# Patient Record
Sex: Female | Born: 1950
Health system: Southern US, Community
[De-identification: ages and names within clinical notes are randomized; demographics above are authoritative.]

## PROBLEM LIST (undated history)

## (undated) DIAGNOSIS — I639 Cerebral infarction, unspecified: Secondary | ICD-10-CM

## (undated) DIAGNOSIS — M5136 Other intervertebral disc degeneration, lumbar region: Secondary | ICD-10-CM

## (undated) DIAGNOSIS — M533 Sacrococcygeal disorders, not elsewhere classified: Secondary | ICD-10-CM

## (undated) DIAGNOSIS — M51369 Other intervertebral disc degeneration, lumbar region without mention of lumbar back pain or lower extremity pain: Secondary | ICD-10-CM

## (undated) DIAGNOSIS — J302 Other seasonal allergic rhinitis: Secondary | ICD-10-CM

## (undated) DIAGNOSIS — M5416 Radiculopathy, lumbar region: Secondary | ICD-10-CM

## (undated) DIAGNOSIS — M5431 Sciatica, right side: Secondary | ICD-10-CM

## (undated) DIAGNOSIS — M1990 Unspecified osteoarthritis, unspecified site: Secondary | ICD-10-CM

## (undated) DIAGNOSIS — G459 Transient cerebral ischemic attack, unspecified: Secondary | ICD-10-CM

## (undated) DIAGNOSIS — R202 Paresthesia of skin: Secondary | ICD-10-CM

## (undated) DIAGNOSIS — M810 Age-related osteoporosis without current pathological fracture: Secondary | ICD-10-CM

## (undated) DIAGNOSIS — G43909 Migraine, unspecified, not intractable, without status migrainosus: Secondary | ICD-10-CM

## (undated) DIAGNOSIS — M419 Scoliosis, unspecified: Secondary | ICD-10-CM

## (undated) DIAGNOSIS — E7800 Pure hypercholesterolemia, unspecified: Secondary | ICD-10-CM

## (undated) DIAGNOSIS — R739 Hyperglycemia, unspecified: Secondary | ICD-10-CM

## (undated) DIAGNOSIS — R519 Headache, unspecified: Secondary | ICD-10-CM

## (undated) DIAGNOSIS — F411 Generalized anxiety disorder: Secondary | ICD-10-CM

## (undated) DIAGNOSIS — E785 Hyperlipidemia, unspecified: Secondary | ICD-10-CM

## (undated) HISTORY — DX: Pure hypercholesterolemia, unspecified: E78.00

## (undated) HISTORY — DX: Cerebral infarction, unspecified: I63.9

## (undated) HISTORY — DX: Hyperlipidemia, unspecified: E78.5

## (undated) HISTORY — DX: Paresthesia of skin: R20.2

## (undated) HISTORY — DX: Sacrococcygeal disorders, not elsewhere classified: M53.3

## (undated) HISTORY — DX: Scoliosis, unspecified: M41.9

## (undated) HISTORY — DX: Generalized anxiety disorder: F41.1

## (undated) HISTORY — DX: Other intervertebral disc degeneration, lumbar region: M51.36

## (undated) HISTORY — DX: Sciatica, right side: M54.31

## (undated) HISTORY — DX: Other intervertebral disc degeneration, lumbar region without mention of lumbar back pain or lower extremity pain: M51.369

## (undated) HISTORY — DX: Radiculopathy, lumbar region: M54.16

## (undated) HISTORY — DX: Transient cerebral ischemic attack, unspecified: G45.9

## (undated) HISTORY — DX: Headache, unspecified: R51.9

## (undated) HISTORY — PX: HAND SURGERY: SHX662

## (undated) HISTORY — DX: Other seasonal allergic rhinitis: J30.2

## (undated) HISTORY — DX: Migraine, unspecified, not intractable, without status migrainosus: G43.909

## (undated) HISTORY — DX: Hyperglycemia, unspecified: R73.9

## (undated) HISTORY — DX: Age-related osteoporosis without current pathological fracture: M81.0

## (undated) HISTORY — DX: Unspecified osteoarthritis, unspecified site: M19.90

---

## 1975-08-13 HISTORY — PX: OVARIAN CYST REMOVAL: SHX89

## 2002-01-27 LAB — HM COLONOSCOPY

## 2011-05-01 LAB — HM COLONOSCOPY

## 2015-05-19 ENCOUNTER — Other Ambulatory Visit: Payer: Self-pay | Admitting: Obstetrics and Gynecology

## 2015-05-19 DIAGNOSIS — R928 Other abnormal and inconclusive findings on diagnostic imaging of breast: Secondary | ICD-10-CM

## 2015-06-05 ENCOUNTER — Ambulatory Visit
Admission: RE | Admit: 2015-06-05 | Discharge: 2015-06-05 | Disposition: A | Discharge status: Home / Self Care | Payer: BLUE CROSS/BLUE SHIELD | Source: Ambulatory Visit | Attending: Obstetrics and Gynecology | Admitting: Obstetrics and Gynecology

## 2015-06-05 DIAGNOSIS — R928 Other abnormal and inconclusive findings on diagnostic imaging of breast: Secondary | ICD-10-CM

## 2016-06-15 DIAGNOSIS — N3001 Acute cystitis with hematuria: Secondary | ICD-10-CM | POA: Diagnosis not present

## 2016-06-25 DIAGNOSIS — R0789 Other chest pain: Secondary | ICD-10-CM | POA: Diagnosis not present

## 2016-06-25 DIAGNOSIS — F411 Generalized anxiety disorder: Secondary | ICD-10-CM | POA: Diagnosis not present

## 2016-06-25 DIAGNOSIS — R531 Weakness: Secondary | ICD-10-CM | POA: Diagnosis not present

## 2016-06-25 DIAGNOSIS — R739 Hyperglycemia, unspecified: Secondary | ICD-10-CM | POA: Diagnosis not present

## 2016-06-25 DIAGNOSIS — E7800 Pure hypercholesterolemia, unspecified: Secondary | ICD-10-CM | POA: Diagnosis not present

## 2016-06-25 DIAGNOSIS — Z0000 Encounter for general adult medical examination without abnormal findings: Secondary | ICD-10-CM | POA: Diagnosis not present

## 2016-06-25 DIAGNOSIS — M19041 Primary osteoarthritis, right hand: Secondary | ICD-10-CM | POA: Diagnosis not present

## 2016-07-17 DIAGNOSIS — E784 Other hyperlipidemia: Secondary | ICD-10-CM | POA: Diagnosis not present

## 2016-07-17 DIAGNOSIS — Z8249 Family history of ischemic heart disease and other diseases of the circulatory system: Secondary | ICD-10-CM | POA: Diagnosis not present

## 2016-07-17 DIAGNOSIS — R29898 Other symptoms and signs involving the musculoskeletal system: Secondary | ICD-10-CM | POA: Diagnosis not present

## 2016-08-23 DIAGNOSIS — H16223 Keratoconjunctivitis sicca, not specified as Sjogren's, bilateral: Secondary | ICD-10-CM | POA: Diagnosis not present

## 2016-08-23 DIAGNOSIS — H04123 Dry eye syndrome of bilateral lacrimal glands: Secondary | ICD-10-CM | POA: Diagnosis not present

## 2016-08-23 DIAGNOSIS — H25813 Combined forms of age-related cataract, bilateral: Secondary | ICD-10-CM | POA: Diagnosis not present

## 2016-09-05 DIAGNOSIS — Z85828 Personal history of other malignant neoplasm of skin: Secondary | ICD-10-CM | POA: Diagnosis not present

## 2016-09-05 DIAGNOSIS — D2271 Melanocytic nevi of right lower limb, including hip: Secondary | ICD-10-CM | POA: Diagnosis not present

## 2016-09-05 DIAGNOSIS — Z23 Encounter for immunization: Secondary | ICD-10-CM | POA: Diagnosis not present

## 2016-09-05 DIAGNOSIS — L089 Local infection of the skin and subcutaneous tissue, unspecified: Secondary | ICD-10-CM | POA: Diagnosis not present

## 2016-09-05 DIAGNOSIS — L821 Other seborrheic keratosis: Secondary | ICD-10-CM | POA: Diagnosis not present

## 2016-10-23 DIAGNOSIS — M79641 Pain in right hand: Secondary | ICD-10-CM | POA: Diagnosis not present

## 2016-10-23 DIAGNOSIS — M65342 Trigger finger, left ring finger: Secondary | ICD-10-CM | POA: Diagnosis not present

## 2016-10-23 DIAGNOSIS — M65312 Trigger thumb, left thumb: Secondary | ICD-10-CM | POA: Diagnosis not present

## 2016-10-23 DIAGNOSIS — M79642 Pain in left hand: Secondary | ICD-10-CM | POA: Diagnosis not present

## 2016-10-31 DIAGNOSIS — B349 Viral infection, unspecified: Secondary | ICD-10-CM | POA: Diagnosis not present

## 2016-10-31 DIAGNOSIS — J029 Acute pharyngitis, unspecified: Secondary | ICD-10-CM | POA: Diagnosis not present

## 2016-11-02 DIAGNOSIS — B301 Conjunctivitis due to adenovirus: Secondary | ICD-10-CM | POA: Diagnosis not present

## 2016-11-11 DIAGNOSIS — B301 Conjunctivitis due to adenovirus: Secondary | ICD-10-CM | POA: Diagnosis not present

## 2016-11-11 DIAGNOSIS — H16223 Keratoconjunctivitis sicca, not specified as Sjogren's, bilateral: Secondary | ICD-10-CM | POA: Diagnosis not present

## 2016-11-11 DIAGNOSIS — H25813 Combined forms of age-related cataract, bilateral: Secondary | ICD-10-CM | POA: Diagnosis not present

## 2016-11-11 DIAGNOSIS — H04123 Dry eye syndrome of bilateral lacrimal glands: Secondary | ICD-10-CM | POA: Diagnosis not present

## 2016-11-19 DIAGNOSIS — R0981 Nasal congestion: Secondary | ICD-10-CM | POA: Diagnosis not present

## 2016-11-19 DIAGNOSIS — M19041 Primary osteoarthritis, right hand: Secondary | ICD-10-CM | POA: Diagnosis not present

## 2016-11-19 DIAGNOSIS — R05 Cough: Secondary | ICD-10-CM | POA: Diagnosis not present

## 2016-11-19 DIAGNOSIS — M19042 Primary osteoarthritis, left hand: Secondary | ICD-10-CM | POA: Diagnosis not present

## 2016-11-19 DIAGNOSIS — B349 Viral infection, unspecified: Secondary | ICD-10-CM | POA: Diagnosis not present

## 2016-12-03 DIAGNOSIS — J343 Hypertrophy of nasal turbinates: Secondary | ICD-10-CM | POA: Diagnosis not present

## 2016-12-03 DIAGNOSIS — J3089 Other allergic rhinitis: Secondary | ICD-10-CM | POA: Insufficient documentation

## 2016-12-03 DIAGNOSIS — J302 Other seasonal allergic rhinitis: Secondary | ICD-10-CM | POA: Diagnosis not present

## 2016-12-16 DIAGNOSIS — M180 Bilateral primary osteoarthritis of first carpometacarpal joints: Secondary | ICD-10-CM | POA: Diagnosis not present

## 2016-12-16 DIAGNOSIS — M19041 Primary osteoarthritis, right hand: Secondary | ICD-10-CM | POA: Diagnosis not present

## 2016-12-16 DIAGNOSIS — M65312 Trigger thumb, left thumb: Secondary | ICD-10-CM | POA: Diagnosis not present

## 2016-12-16 DIAGNOSIS — M19042 Primary osteoarthritis, left hand: Secondary | ICD-10-CM | POA: Diagnosis not present

## 2016-12-16 DIAGNOSIS — M65341 Trigger finger, right ring finger: Secondary | ICD-10-CM | POA: Diagnosis not present

## 2016-12-16 DIAGNOSIS — M65332 Trigger finger, left middle finger: Secondary | ICD-10-CM | POA: Diagnosis not present

## 2016-12-16 DIAGNOSIS — M65342 Trigger finger, left ring finger: Secondary | ICD-10-CM | POA: Diagnosis not present

## 2016-12-24 DIAGNOSIS — E7800 Pure hypercholesterolemia, unspecified: Secondary | ICD-10-CM | POA: Diagnosis not present

## 2016-12-24 DIAGNOSIS — M19041 Primary osteoarthritis, right hand: Secondary | ICD-10-CM | POA: Diagnosis not present

## 2016-12-24 DIAGNOSIS — R739 Hyperglycemia, unspecified: Secondary | ICD-10-CM | POA: Diagnosis not present

## 2016-12-24 DIAGNOSIS — M19042 Primary osteoarthritis, left hand: Secondary | ICD-10-CM | POA: Diagnosis not present

## 2016-12-24 DIAGNOSIS — M816 Localized osteoporosis [Lequesne]: Secondary | ICD-10-CM | POA: Diagnosis not present

## 2017-01-13 DIAGNOSIS — M810 Age-related osteoporosis without current pathological fracture: Secondary | ICD-10-CM | POA: Diagnosis not present

## 2017-02-04 DIAGNOSIS — I8312 Varicose veins of left lower extremity with inflammation: Secondary | ICD-10-CM | POA: Diagnosis not present

## 2017-02-04 DIAGNOSIS — I8311 Varicose veins of right lower extremity with inflammation: Secondary | ICD-10-CM | POA: Diagnosis not present

## 2017-02-11 DIAGNOSIS — Z779 Other contact with and (suspected) exposures hazardous to health: Secondary | ICD-10-CM | POA: Diagnosis not present

## 2017-02-11 DIAGNOSIS — Z124 Encounter for screening for malignant neoplasm of cervix: Secondary | ICD-10-CM | POA: Diagnosis not present

## 2017-02-11 DIAGNOSIS — Z681 Body mass index (BMI) 19 or less, adult: Secondary | ICD-10-CM | POA: Diagnosis not present

## 2017-02-14 DIAGNOSIS — I8312 Varicose veins of left lower extremity with inflammation: Secondary | ICD-10-CM | POA: Diagnosis not present

## 2017-02-14 DIAGNOSIS — I8311 Varicose veins of right lower extremity with inflammation: Secondary | ICD-10-CM | POA: Diagnosis not present

## 2017-03-12 DIAGNOSIS — I8311 Varicose veins of right lower extremity with inflammation: Secondary | ICD-10-CM | POA: Diagnosis not present

## 2017-03-12 DIAGNOSIS — I8312 Varicose veins of left lower extremity with inflammation: Secondary | ICD-10-CM | POA: Diagnosis not present

## 2017-04-16 DIAGNOSIS — Z23 Encounter for immunization: Secondary | ICD-10-CM | POA: Diagnosis not present

## 2017-04-21 DIAGNOSIS — N390 Urinary tract infection, site not specified: Secondary | ICD-10-CM | POA: Diagnosis not present

## 2017-04-21 DIAGNOSIS — N760 Acute vaginitis: Secondary | ICD-10-CM | POA: Diagnosis not present

## 2017-04-28 ENCOUNTER — Other Ambulatory Visit: Payer: Self-pay | Admitting: Family Medicine

## 2017-04-28 DIAGNOSIS — M419 Scoliosis, unspecified: Secondary | ICD-10-CM

## 2017-04-28 DIAGNOSIS — N3090 Cystitis, unspecified without hematuria: Secondary | ICD-10-CM | POA: Diagnosis not present

## 2017-04-28 DIAGNOSIS — M5431 Sciatica, right side: Secondary | ICD-10-CM | POA: Diagnosis not present

## 2017-04-28 DIAGNOSIS — R739 Hyperglycemia, unspecified: Secondary | ICD-10-CM | POA: Diagnosis not present

## 2017-04-28 DIAGNOSIS — R202 Paresthesia of skin: Secondary | ICD-10-CM | POA: Diagnosis not present

## 2017-04-28 DIAGNOSIS — M5416 Radiculopathy, lumbar region: Secondary | ICD-10-CM | POA: Diagnosis not present

## 2017-04-28 DIAGNOSIS — E7800 Pure hypercholesterolemia, unspecified: Secondary | ICD-10-CM | POA: Diagnosis not present

## 2017-04-28 DIAGNOSIS — M19041 Primary osteoarthritis, right hand: Secondary | ICD-10-CM | POA: Diagnosis not present

## 2017-04-28 DIAGNOSIS — M19042 Primary osteoarthritis, left hand: Secondary | ICD-10-CM | POA: Diagnosis not present

## 2017-05-05 DIAGNOSIS — N390 Urinary tract infection, site not specified: Secondary | ICD-10-CM | POA: Diagnosis not present

## 2017-05-05 DIAGNOSIS — Z1589 Genetic susceptibility to other disease: Secondary | ICD-10-CM | POA: Diagnosis not present

## 2017-05-05 DIAGNOSIS — L509 Urticaria, unspecified: Secondary | ICD-10-CM | POA: Diagnosis not present

## 2017-05-08 ENCOUNTER — Other Ambulatory Visit: Payer: Self-pay | Admitting: Family Medicine

## 2017-05-08 ENCOUNTER — Ambulatory Visit
Admission: RE | Admit: 2017-05-08 | Discharge: 2017-05-08 | Disposition: A | Discharge status: Home / Self Care | Payer: Medicare Other | Source: Ambulatory Visit | Attending: Family Medicine | Admitting: Family Medicine

## 2017-05-08 DIAGNOSIS — M48061 Spinal stenosis, lumbar region without neurogenic claudication: Secondary | ICD-10-CM | POA: Diagnosis not present

## 2017-05-08 DIAGNOSIS — M419 Scoliosis, unspecified: Secondary | ICD-10-CM

## 2017-05-27 DIAGNOSIS — M5416 Radiculopathy, lumbar region: Secondary | ICD-10-CM | POA: Diagnosis not present

## 2017-05-27 DIAGNOSIS — M5136 Other intervertebral disc degeneration, lumbar region: Secondary | ICD-10-CM | POA: Diagnosis not present

## 2017-05-29 DIAGNOSIS — N3000 Acute cystitis without hematuria: Secondary | ICD-10-CM | POA: Diagnosis not present

## 2017-06-24 DIAGNOSIS — M150 Primary generalized (osteo)arthritis: Secondary | ICD-10-CM | POA: Diagnosis not present

## 2017-06-24 DIAGNOSIS — Z681 Body mass index (BMI) 19 or less, adult: Secondary | ICD-10-CM | POA: Diagnosis not present

## 2017-06-24 DIAGNOSIS — M5136 Other intervertebral disc degeneration, lumbar region: Secondary | ICD-10-CM | POA: Diagnosis not present

## 2017-06-24 DIAGNOSIS — M5030 Other cervical disc degeneration, unspecified cervical region: Secondary | ICD-10-CM | POA: Diagnosis not present

## 2017-06-24 DIAGNOSIS — M810 Age-related osteoporosis without current pathological fracture: Secondary | ICD-10-CM | POA: Diagnosis not present

## 2017-06-24 DIAGNOSIS — Z1589 Genetic susceptibility to other disease: Secondary | ICD-10-CM | POA: Diagnosis not present

## 2017-06-27 DIAGNOSIS — Z1231 Encounter for screening mammogram for malignant neoplasm of breast: Secondary | ICD-10-CM | POA: Diagnosis not present

## 2017-07-01 DIAGNOSIS — I8311 Varicose veins of right lower extremity with inflammation: Secondary | ICD-10-CM | POA: Diagnosis not present

## 2017-07-17 DIAGNOSIS — M5136 Other intervertebral disc degeneration, lumbar region: Secondary | ICD-10-CM | POA: Diagnosis not present

## 2017-07-17 DIAGNOSIS — M5416 Radiculopathy, lumbar region: Secondary | ICD-10-CM | POA: Diagnosis not present

## 2017-07-23 DIAGNOSIS — I8312 Varicose veins of left lower extremity with inflammation: Secondary | ICD-10-CM | POA: Diagnosis not present

## 2017-07-31 DIAGNOSIS — M5136 Other intervertebral disc degeneration, lumbar region: Secondary | ICD-10-CM | POA: Diagnosis not present

## 2017-07-31 DIAGNOSIS — M5416 Radiculopathy, lumbar region: Secondary | ICD-10-CM | POA: Diagnosis not present

## 2017-08-22 DIAGNOSIS — M7981 Nontraumatic hematoma of soft tissue: Secondary | ICD-10-CM | POA: Diagnosis not present

## 2017-08-22 DIAGNOSIS — I8311 Varicose veins of right lower extremity with inflammation: Secondary | ICD-10-CM | POA: Diagnosis not present

## 2017-08-26 DIAGNOSIS — H16223 Keratoconjunctivitis sicca, not specified as Sjogren's, bilateral: Secondary | ICD-10-CM | POA: Diagnosis not present

## 2017-08-26 DIAGNOSIS — H04123 Dry eye syndrome of bilateral lacrimal glands: Secondary | ICD-10-CM | POA: Diagnosis not present

## 2017-08-26 DIAGNOSIS — H2513 Age-related nuclear cataract, bilateral: Secondary | ICD-10-CM | POA: Diagnosis not present

## 2017-08-29 DIAGNOSIS — M5416 Radiculopathy, lumbar region: Secondary | ICD-10-CM | POA: Diagnosis not present

## 2017-08-29 DIAGNOSIS — M19042 Primary osteoarthritis, left hand: Secondary | ICD-10-CM | POA: Diagnosis not present

## 2017-08-29 DIAGNOSIS — Z87440 Personal history of urinary (tract) infections: Secondary | ICD-10-CM | POA: Diagnosis not present

## 2017-08-29 DIAGNOSIS — R739 Hyperglycemia, unspecified: Secondary | ICD-10-CM | POA: Diagnosis not present

## 2017-08-29 DIAGNOSIS — M19041 Primary osteoarthritis, right hand: Secondary | ICD-10-CM | POA: Diagnosis not present

## 2017-08-29 DIAGNOSIS — E7800 Pure hypercholesterolemia, unspecified: Secondary | ICD-10-CM | POA: Diagnosis not present

## 2017-08-30 DIAGNOSIS — M533 Sacrococcygeal disorders, not elsewhere classified: Secondary | ICD-10-CM | POA: Diagnosis not present

## 2017-09-02 DIAGNOSIS — I8312 Varicose veins of left lower extremity with inflammation: Secondary | ICD-10-CM | POA: Diagnosis not present

## 2017-09-02 DIAGNOSIS — R3129 Other microscopic hematuria: Secondary | ICD-10-CM | POA: Diagnosis not present

## 2017-09-22 DIAGNOSIS — M533 Sacrococcygeal disorders, not elsewhere classified: Secondary | ICD-10-CM | POA: Diagnosis not present

## 2017-09-22 DIAGNOSIS — M5416 Radiculopathy, lumbar region: Secondary | ICD-10-CM | POA: Diagnosis not present

## 2017-09-27 DIAGNOSIS — M6281 Muscle weakness (generalized): Secondary | ICD-10-CM | POA: Diagnosis not present

## 2017-09-27 DIAGNOSIS — M5416 Radiculopathy, lumbar region: Secondary | ICD-10-CM | POA: Diagnosis not present

## 2017-10-07 DIAGNOSIS — N3021 Other chronic cystitis with hematuria: Secondary | ICD-10-CM | POA: Diagnosis not present

## 2017-10-07 DIAGNOSIS — R3121 Asymptomatic microscopic hematuria: Secondary | ICD-10-CM | POA: Diagnosis not present

## 2017-10-07 DIAGNOSIS — R3982 Chronic bladder pain: Secondary | ICD-10-CM | POA: Diagnosis not present

## 2017-10-14 DIAGNOSIS — M5416 Radiculopathy, lumbar region: Secondary | ICD-10-CM | POA: Diagnosis not present

## 2017-11-18 ENCOUNTER — Emergency Department (HOSPITAL_COMMUNITY): Admission: EM | Admit: 2017-11-18 | Discharge: 2017-11-18 | Discharge status: Absent without leave | Payer: 59

## 2017-11-24 ENCOUNTER — Encounter: Payer: Self-pay | Admitting: Neurology

## 2017-11-24 ENCOUNTER — Telehealth: Payer: Self-pay | Admitting: Neurology

## 2017-11-24 ENCOUNTER — Encounter (INDEPENDENT_AMBULATORY_CARE_PROVIDER_SITE_OTHER): Payer: Self-pay

## 2017-11-24 ENCOUNTER — Ambulatory Visit (INDEPENDENT_AMBULATORY_CARE_PROVIDER_SITE_OTHER): Payer: Medicare Other | Admitting: Neurology

## 2017-11-24 VITALS — BP 109/68 | HR 79 | Ht 65.0 in | Wt 120.5 lb

## 2017-11-24 DIAGNOSIS — M542 Cervicalgia: Secondary | ICD-10-CM | POA: Diagnosis not present

## 2017-11-24 DIAGNOSIS — R202 Paresthesia of skin: Secondary | ICD-10-CM | POA: Diagnosis not present

## 2017-11-24 NOTE — Telephone Encounter (Signed)
Cigna/medicare order sent to GI they obtain auth for cigna and reach out to the pt to schedule.

## 2017-11-24 NOTE — Progress Notes (Signed)
PATIENTWinola Murray DOB: July 22, 1951  Chief Complaint  Patient presents with  . Muscle weakness    She is here for further evaluation of right foot numbness and intermittent bilateral, arm weakness.  She has had two epidural steroid injections and one nerve block, by Dr. Nelva Bush, for chronic back pain which has been helpful.  [name], MD - referring MD  . PCP    [name], MD     HISTORICAL  Tina Murray is a 67 year old female, seen in refer by primary care doctor [name] for evaluation of muscle weakness, initial evaluation was on November 24, 2017.  Reviewed and summarized in note, she has history of osteoarthritis  She fell on black ice landed on her back in 2015, suffered sacral and pelvic fracture, since the incident, she began to have chronic low back pain, intermittent radiating pain to right lower extremity, getting worse in 2018, she had epidural injection twice, eventually had nerve block by Dr. Nelva Bush in February 2019, which has greatly helped her symptoms, she has no longer have significant pain, intermittent numbness at the ball of her right foot, denies bowel and bladder incontinence, no radiating pain to left lower extremity.  She also reported a history of chronic neck pain, radiating achiness to bilateral shoulder, intermittent sudden onset right upper extremity weakness, has to drop down her shoulders to alleviate the symptoms and weakness.  She denies gait abnormality, no bowel bladder incontinence,  MRI of lumbar showed multilevel degenerative disc disease, with moderate foraminal stenosis bilaterally L5-S1, facet hypertrophy, vertebral spurring,  REVIEW OF SYSTEMS: Full 14 system review of systems performed and notable only for as above.  ALLERGIES: Not on File  HOME MEDICATIONS: Current Outpatient Medications  Medication Sig Dispense Refill  . diclofenac sodium (VOLTAREN) 1 % GEL diclofenac 1 % topical gel  APPLY 2  GRAM TO THE AFFECTED AREA(S) BY TOPICAL ROUTE 4 TIMES PER DAY    . ezetimibe (ZETIA) 10 MG tablet Take 10 mg by mouth daily.  3  . fluticasone (FLONASE) 50 MCG/ACT nasal spray Place 1 spray into both nostrils daily.    . Loratadine (CLARITIN) 10 MG CAPS Take 10 mg by mouth daily.    . ranitidine (ZANTAC) 150 MG tablet Take 150 mg by mouth 2 times daily.     No current facility-administered medications for this visit.     PAST MEDICAL HISTORY: Past Medical History:  Diagnosis Date  . Hyperlipemia   . Lumbar radiculopathy   . Sacroiliac joint pain   . Seasonal allergies     PAST SURGICAL HISTORY: History reviewed. No pertinent surgical history.  FAMILY HISTORY: Family History  Problem Relation Age of Onset  . Stroke Mother        age 67  . COPD Mother   . Other Father        age 29 - complications from surgery    SOCIAL HISTORY:  Social History   Socioeconomic History  . Marital status: Unknown    Spouse name: Not on file  . Number of children: 0  . Years of education: 59  . Highest education level: Associate degree: occupational, Hotel manager, or vocational program  Occupational History  . Occupation: Retired  Scientific laboratory technician  . Financial resource strain: Not on file  . Food insecurity:    Worry: Not on file    Inability: Not on file  . Transportation needs:    Medical: Not on file  Non-medical: Not on file  Tobacco Use  . Smoking status: Never Smoker  . Smokeless tobacco: Never Used  Substance and Sexual Activity  . Alcohol use: Yes    Comment: social  . Drug use: Never  . Sexual activity: Not on file  Lifestyle  . Physical activity:    Days per week: Not on file    Minutes per session: Not on file  . Stress: Not on file  Relationships  . Social connections:    Talks on phone: Not on file    Gets together: Not on file    Attends religious service: Not on file    Active member of club or organization: Not on file    Attends meetings of clubs or  organizations: Not on file    Relationship status: Not on file  . Intimate partner violence:    Fear of current or ex partner: Not on file    Emotionally abused: Not on file    Physically abused: Not on file    Forced sexual activity: Not on file  Other Topics Concern  . Not on file  Social History Narrative   Lives at home with husband.   Right-handed.   1 cup caffeine daily.     PHYSICAL EXAM   Vitals:   11/24/17 1351  BP: 109/68  Pulse: 79  Weight: 120 lb 8 oz (54.7 kg)  Height: 5\' 5"  (1.651 m)    Not recorded      Body mass index is 20.05 kg/m.  PHYSICAL EXAMNIATION:  Gen: NAD, conversant, well nourised, obese, well groomed                     Cardiovascular: Regular rate rhythm, no peripheral edema, warm, nontender. Eyes: Conjunctivae clear without exudates or hemorrhage Neck: Supple, no carotid bruits. Pulmonary: Clear to auscultation bilaterally   NEUROLOGICAL EXAM:  MENTAL STATUS: Speech:    Speech is normal; fluent and spontaneous with normal comprehension.  Cognition:     Orientation to time, place and person     Normal recent and remote memory     Normal Attention span and concentration     Normal Language, naming, repeating,spontaneous speech     Fund of knowledge   CRANIAL NERVES: CN II: Visual fields are full to confrontation. Fundoscopic exam is normal with sharp discs and no vascular changes. Pupils are round equal and briskly reactive to light. CN III, IV, VI: extraocular movement are normal. No ptosis. CN V: Facial sensation is intact to pinprick in all 3 divisions bilaterally. Corneal responses are intact.  CN VII: Face is symmetric with normal eye closure and smile. CN VIII: Hearing is normal to rubbing fingers CN IX, X: Palate elevates symmetrically. Phonation is normal. CN XI: Head turning and shoulder shrug are intact CN XII: Tongue is midline with normal movements and no atrophy.  MOTOR: There is no pronator drift of out-stretched  arms. Muscle bulk and tone are normal. Muscle strength is normal.  REFLEXES: Reflexes are 3 and symmetric at the biceps, triceps, knees, and ankles. Plantar responses are flexor.  SENSORY: Intact to light touch, pinprick, positional sensation and vibratory sensation are intact in fingers and toes.  COORDINATION: Rapid alternating movements and fine finger movements are intact. There is no dysmetria on finger-to-nose and heel-knee-shin.    GAIT/STANCE: Posture is normal. Gait is steady with normal steps, base, arm swing, and turning. Heel and toe walking are normal. Tandem gait is normal.  Romberg is  absent.   DIAGNOSTIC DATA (LABS, IMAGING, TESTING) - I reviewed patient records, labs, notes, testing and imaging myself where available.   ASSESSMENT AND PLAN  Demeka Sutter is a 67 y.o. female   Right low back pain, radicular pain to right lower extremity,  Suggestive of right lumbar radiculopathy,  Improved with recent block Chronic neck pain, radiating pain to bilateral shoulder   hyperreflexia on examinations  History of osteoarthritis,  MRI of cervical spine to rule out cervical spondylitic myelopathy  Marcial Pacas, M.D. Ph.D.  [facility] [address] [place], [place] [address] Ph: [phone] Fax: [phone]  [id number], Edwyna Shell, MD

## 2017-12-05 DIAGNOSIS — Z85828 Personal history of other malignant neoplasm of skin: Secondary | ICD-10-CM | POA: Diagnosis not present

## 2017-12-05 DIAGNOSIS — R238 Other skin changes: Secondary | ICD-10-CM | POA: Diagnosis not present

## 2017-12-05 DIAGNOSIS — L821 Other seborrheic keratosis: Secondary | ICD-10-CM | POA: Diagnosis not present

## 2017-12-05 DIAGNOSIS — L309 Dermatitis, unspecified: Secondary | ICD-10-CM | POA: Diagnosis not present

## 2017-12-05 DIAGNOSIS — L84 Corns and callosities: Secondary | ICD-10-CM | POA: Diagnosis not present

## 2017-12-11 ENCOUNTER — Ambulatory Visit
Admission: RE | Admit: 2017-12-11 | Discharge: 2017-12-11 | Disposition: A | Discharge status: Home / Self Care | Payer: Medicare Other | Source: Ambulatory Visit | Attending: Neurology | Admitting: Neurology

## 2017-12-11 DIAGNOSIS — M542 Cervicalgia: Secondary | ICD-10-CM | POA: Diagnosis not present

## 2017-12-13 ENCOUNTER — Telehealth: Payer: Self-pay | Admitting: Neurology

## 2017-12-13 NOTE — Telephone Encounter (Signed)
Please call patient, MRI cervical s showed multilevel degenerative changes, most noticeable at C4-5, C5-6, there are variable degree of foraminal narrowing, there is no evidence of spinal cord compression,  Will review MRI films at her next visit in May  IMPRESSION: This MRI of the cervical spine without contrast shows the following: 1.   The spinal cord appears normal. 2.   At C4-C5, there are degenerative changes more on the left causing moderately severe left foraminal narrowing with some potential for left C5 nerve root compression. 3.   At C5-C6, there is retrolisthesis and other degenerative changes causing moderate spinal stenosis.  Neural foramina are moderately narrowed but there does not appear to be any definite nerve root compression. 4.   At C6-C7, there is minimal spinal stenosis but no nerve root compression. 5.   Milder degenerative changes at C2-C3, C3-C4 and C7-T1 do not lead to any spinal stenosis or nerve root compression.

## 2017-12-15 NOTE — Telephone Encounter (Signed)
Left message requesting a return call.

## 2017-12-16 NOTE — Telephone Encounter (Signed)
Spoke to patient - she is aware of results and will keep her pending appt on 01/02/18 for NCV/EMG.

## 2017-12-25 DIAGNOSIS — M19041 Primary osteoarthritis, right hand: Secondary | ICD-10-CM | POA: Diagnosis not present

## 2017-12-25 DIAGNOSIS — E7800 Pure hypercholesterolemia, unspecified: Secondary | ICD-10-CM | POA: Diagnosis not present

## 2017-12-25 DIAGNOSIS — M5416 Radiculopathy, lumbar region: Secondary | ICD-10-CM | POA: Diagnosis not present

## 2017-12-25 DIAGNOSIS — M19042 Primary osteoarthritis, left hand: Secondary | ICD-10-CM | POA: Diagnosis not present

## 2017-12-25 DIAGNOSIS — R739 Hyperglycemia, unspecified: Secondary | ICD-10-CM | POA: Diagnosis not present

## 2017-12-26 DIAGNOSIS — M19042 Primary osteoarthritis, left hand: Secondary | ICD-10-CM | POA: Diagnosis not present

## 2017-12-26 DIAGNOSIS — M5416 Radiculopathy, lumbar region: Secondary | ICD-10-CM | POA: Diagnosis not present

## 2017-12-26 DIAGNOSIS — M19041 Primary osteoarthritis, right hand: Secondary | ICD-10-CM | POA: Diagnosis not present

## 2017-12-26 DIAGNOSIS — R739 Hyperglycemia, unspecified: Secondary | ICD-10-CM | POA: Diagnosis not present

## 2017-12-26 DIAGNOSIS — E7800 Pure hypercholesterolemia, unspecified: Secondary | ICD-10-CM | POA: Diagnosis not present

## 2017-12-31 ENCOUNTER — Telehealth: Payer: Self-pay | Admitting: Neurology

## 2017-12-31 DIAGNOSIS — M6530 Trigger finger, unspecified finger: Secondary | ICD-10-CM | POA: Diagnosis not present

## 2017-12-31 DIAGNOSIS — M13841 Other specified arthritis, right hand: Secondary | ICD-10-CM | POA: Diagnosis not present

## 2017-12-31 DIAGNOSIS — M13842 Other specified arthritis, left hand: Secondary | ICD-10-CM | POA: Diagnosis not present

## 2017-12-31 MED ORDER — ALPRAZOLAM 1 MG PO TABS: ORAL_TABLET | ORAL | 0 refills | Status: DC

## 2017-12-31 NOTE — Telephone Encounter (Signed)
Pt would like to know if something can be called in for her. States she does not want to feel the pain of the test. Please call her pharmacy [facility] on [address] [phone]. If you have any questiions for pt please call pt at [phone] home or cell [phone]. Thank you.

## 2017-12-31 NOTE — Telephone Encounter (Signed)
Spoke to patient - she is anxious about the exam and would like something to calm her the day of the test.  Per vo by Dr. Krista Blue, ok to provide rx for Xanax 1mg , taking one tablet 30 minutes prior to her test and repeating once, if needed.     Returned call to patient - she is agreeable to this plan and aware she must have a driver with her.  Rx sent to pharmacy.

## 2018-01-02 ENCOUNTER — Ambulatory Visit (INDEPENDENT_AMBULATORY_CARE_PROVIDER_SITE_OTHER): Payer: Medicare Other | Admitting: Neurology

## 2018-01-02 ENCOUNTER — Telehealth: Payer: Self-pay | Admitting: Neurology

## 2018-01-02 DIAGNOSIS — M542 Cervicalgia: Secondary | ICD-10-CM

## 2018-01-02 DIAGNOSIS — Z0289 Encounter for other administrative examinations: Secondary | ICD-10-CM

## 2018-01-02 DIAGNOSIS — R202 Paresthesia of skin: Secondary | ICD-10-CM | POA: Diagnosis not present

## 2018-01-02 MED ORDER — GABAPENTIN 100 MG PO CAPS: 100.0000 mg | ORAL_CAPSULE | Freq: Two times a day (BID) | ORAL | 11 refills | Status: DC | PRN

## 2018-01-02 NOTE — Telephone Encounter (Signed)
Will review result with patient and his family on today's EMG/NCS

## 2018-01-02 NOTE — Procedures (Signed)
Full Name: Tina Murray Gender: Female MRN #: [id number] Date of Birth: [birth year]    Visit Date: 01/02/2018 10:20 Age: 67 Years 45 Months Old Examining Physician: Marcial Pacas, MD  Referring Physician: Krista Blue, MD History: 67 year old female, presented with chronic neck pain, low back pain, radiating pain to bilateral upper extremity, right leg sometimes,  Summary of the tests: Nerve conduction study: Bilateral sural, superficial peroneal sensory responses were normal. Right median, ulnar, radial sensory responses were normal. Bilateral tibial, left peroneal to EDB motor responses were normal. Right peroneal to EDB motor responses showed moderately prolonged distal latency, with mildly decreased the C map amplitude, mild slowing conduction velocity.  Right median, ulnar motor responses were normal.  Electromyography: Selective needle examinations were performed at bilateral upper, lower extremity muscles and bilateral cervical paraspinal muscles.  There was no significant abnormality found.    Conclusion: This is a slight abnormal study.  There is no evidence of right cervical radiculopathy or right lumbosacral radiculopathy.  There is no evidence of large fiber peripheral neuropathy.  There is evidence of a right deep peroneal nerve branch neuropathy.    ------------------------------- Marcial Pacas, M.D.  [facility] [place], [place] [address] Tel: [phone] Fax: [phone]        [facility]    Nerve / Sites Muscle Latency Ref. Amplitude Ref. Rel Amp Segments Distance Velocity Ref. Area    ms ms mV mV %  cm m/s m/s mVms  R Median - APB     Wrist APB 4.1 ?4.4 8.9 ?4.0 100 Wrist - APB 7   26.2     Upper arm APB 8.3  8.0  89.7 Upper arm - Wrist 21 50 ?49 25.1  R Ulnar - ADM     Wrist ADM 3.1 ?3.3 11.1 ?6.0 100 Wrist - ADM 7   38.0     B.Elbow ADM 5.9  10.1  91 B.Elbow - Wrist 17 59 ?49 33.7     A.Elbow ADM 7.7  9.8  96.9 A.Elbow - B.Elbow  10 56 ?49 33.6         A.Elbow - Wrist      R Peroneal - EDB     Ankle EDB 7.4 ?6.5 1.5 ?2.0 100 Ankle - EDB 9   3.4     Fib head EDB 14.8  1.0  68.4 Fib head - Ankle 30 41 ?44 3.2     Pop fossa EDB 17.4  0.7  71.1 Pop fossa - Fib head 10 39 ?44 2.6         Pop fossa - Ankle      L Peroneal - EDB     Ankle EDB 5.6 ?6.5 4.3 ?2.0 100 Ankle - EDB 9   14.4     Fib head EDB 12.2  3.4  78.9 Fib head - Ankle 30 45 ?44 13.7     Pop fossa EDB 14.9  3.1  90.5 Pop fossa - Fib head 12 44 ?44 12.9         Pop fossa - Ankle      R Tibial - AH     Ankle AH 5.2 ?5.8 10.5 ?4.0 100 Ankle - AH 9   22.5     Pop fossa AH 14.2  6.2  59.3 Pop fossa - Ankle 37 41 ?41 16.8  L Tibial - AH     Ankle AH 5.1 ?5.8 13.9 ?4.0 100 Ankle - AH 9   31.3  Pop fossa AH 13.8  10.3  74.1 Pop fossa - Ankle 37 43 ?41 27.2                 SNC    Nerve / Sites Rec. Site Peak Lat Ref.  Amp Ref. Segments Distance Peak Diff Ref.    ms ms V V  cm ms ms  R Radial - Anatomical snuff box (Forearm)     Forearm Wrist 2.7 ?2.9 21 ?15 Forearm - Wrist 10    R Sural - Ankle (Calf)     Calf Ankle 3.7 ?4.4 6 ?6 Calf - Ankle 14    L Sural - Ankle (Calf)     Calf Ankle 3.8 ?4.4 6 ?6 Calf - Ankle 14    R Superficial peroneal - Ankle     Lat leg Ankle 4.0 ?4.4 7 ?6 Lat leg - Ankle 14    L Superficial peroneal - Ankle     Lat leg Ankle 3.9 ?4.4 6 ?6 Lat leg - Ankle 14    R Median, Ulnar - Transcarpal comparison     Median Palm Wrist 2.2 ?2.2 36 ?35 Median Palm - Wrist 8       Ulnar Palm Wrist 2.1 ?2.2 29 ?12 Ulnar Palm - Wrist 8          Median Palm - Ulnar Palm  0.2 ?0.4  R Median - Orthodromic (Dig II, Mid palm)     Dig II Wrist 3.2 ?3.4 16 ?10 Dig II - Wrist 13    R Ulnar - Orthodromic, (Dig V, Mid palm)     Dig V Wrist 2.7 ?3.1 14 ?5 Dig V - Wrist 27                       F  Wave    Nerve F Lat Ref.   ms ms  R Tibial - AH 53.2 ?56.0  L Tibial - AH 53.8 ?56.0  R Ulnar - ADM 26.2 ?32.0           EMG full       EMG  Summary Table    Spontaneous MUAP Recruitment  Muscle IA Fib PSW Fasc Other Amp Dur. Poly Pattern  R. Tibialis anterior Normal None None None _______ Normal Normal Normal Normal  R. Gastrocnemius (Medial head) Normal None None None _______ Normal Normal Normal Normal  R. Vastus lateralis Normal None None None _______ Normal Normal Normal Normal  R. Peroneus longus Normal None None None _______ Normal Normal Normal Normal  R. First dorsal interosseous Normal None None None _______ Normal Normal Normal Normal  R. Pronator teres Normal None None None _______ Normal Normal Normal Normal  R. Triceps brachii Normal None None None _______ Normal Normal Normal Normal  R. Biceps brachii Normal None None None _______ Normal Normal Normal Normal  R. Deltoid Normal None None None _______ Normal Normal Normal Normal  L. First dorsal interosseous Normal None None None _______ Normal Normal Normal Normal  L. Pronator teres Normal None None None _______ Normal Normal Normal Normal  L. Triceps brachii Normal None None None _______ Normal Normal Normal Normal  L. Deltoid Normal None None None _______ Normal Normal Normal Normal  L. Biceps brachii Normal None None None _______ Normal Normal Normal Normal  L. Extensor digitorum communis Normal None None None _______ Normal Normal Normal Normal  R. Extensor digitorum communis Normal None None None _______ Normal Normal Normal Normal  R. Cervical paraspinals  Normal None None None _______ Normal Normal Normal Normal  L. Cervical paraspinals Normal None None None _______ Normal Normal Normal Normal

## 2018-02-20 DIAGNOSIS — M65341 Trigger finger, right ring finger: Secondary | ICD-10-CM | POA: Diagnosis not present

## 2018-02-20 DIAGNOSIS — M19041 Primary osteoarthritis, right hand: Secondary | ICD-10-CM | POA: Diagnosis not present

## 2018-02-20 DIAGNOSIS — M19049 Primary osteoarthritis, unspecified hand: Secondary | ICD-10-CM | POA: Insufficient documentation

## 2018-02-20 DIAGNOSIS — M67843 Other specified disorders of tendon, right hand: Secondary | ICD-10-CM | POA: Diagnosis not present

## 2018-03-04 DIAGNOSIS — M79641 Pain in right hand: Secondary | ICD-10-CM | POA: Diagnosis not present

## 2018-03-04 DIAGNOSIS — Z4789 Encounter for other orthopedic aftercare: Secondary | ICD-10-CM | POA: Diagnosis not present

## 2018-03-09 DIAGNOSIS — M79641 Pain in right hand: Secondary | ICD-10-CM | POA: Diagnosis not present

## 2018-03-10 DIAGNOSIS — R51 Headache: Secondary | ICD-10-CM | POA: Diagnosis not present

## 2018-03-10 DIAGNOSIS — J3489 Other specified disorders of nose and nasal sinuses: Secondary | ICD-10-CM | POA: Diagnosis not present

## 2018-03-10 DIAGNOSIS — J0141 Acute recurrent pansinusitis: Secondary | ICD-10-CM | POA: Insufficient documentation

## 2018-03-10 DIAGNOSIS — J302 Other seasonal allergic rhinitis: Secondary | ICD-10-CM | POA: Diagnosis not present

## 2018-03-12 DIAGNOSIS — M79641 Pain in right hand: Secondary | ICD-10-CM | POA: Diagnosis not present

## 2018-03-18 DIAGNOSIS — Z4789 Encounter for other orthopedic aftercare: Secondary | ICD-10-CM | POA: Diagnosis not present

## 2018-03-18 DIAGNOSIS — M79641 Pain in right hand: Secondary | ICD-10-CM | POA: Diagnosis not present

## 2018-03-30 DIAGNOSIS — Z6820 Body mass index (BMI) 20.0-20.9, adult: Secondary | ICD-10-CM | POA: Diagnosis not present

## 2018-03-30 DIAGNOSIS — Z01419 Encounter for gynecological examination (general) (routine) without abnormal findings: Secondary | ICD-10-CM | POA: Diagnosis not present

## 2018-04-08 DIAGNOSIS — M13841 Other specified arthritis, right hand: Secondary | ICD-10-CM | POA: Diagnosis not present

## 2018-04-08 DIAGNOSIS — M65341 Trigger finger, right ring finger: Secondary | ICD-10-CM | POA: Diagnosis not present

## 2018-04-08 DIAGNOSIS — M79641 Pain in right hand: Secondary | ICD-10-CM | POA: Diagnosis not present

## 2018-04-08 DIAGNOSIS — Z4789 Encounter for other orthopedic aftercare: Secondary | ICD-10-CM | POA: Diagnosis not present

## 2018-04-14 DIAGNOSIS — J342 Deviated nasal septum: Secondary | ICD-10-CM | POA: Diagnosis not present

## 2018-04-14 DIAGNOSIS — R51 Headache: Secondary | ICD-10-CM | POA: Diagnosis not present

## 2018-04-14 DIAGNOSIS — J0141 Acute recurrent pansinusitis: Secondary | ICD-10-CM | POA: Diagnosis not present

## 2018-04-14 DIAGNOSIS — J302 Other seasonal allergic rhinitis: Secondary | ICD-10-CM | POA: Diagnosis not present

## 2018-04-27 DIAGNOSIS — Z23 Encounter for immunization: Secondary | ICD-10-CM | POA: Diagnosis not present

## 2018-05-13 DIAGNOSIS — Z4789 Encounter for other orthopedic aftercare: Secondary | ICD-10-CM | POA: Diagnosis not present

## 2018-05-13 DIAGNOSIS — M1812 Unilateral primary osteoarthritis of first carpometacarpal joint, left hand: Secondary | ICD-10-CM | POA: Diagnosis not present

## 2018-05-13 DIAGNOSIS — M1811 Unilateral primary osteoarthritis of first carpometacarpal joint, right hand: Secondary | ICD-10-CM | POA: Diagnosis not present

## 2018-05-13 DIAGNOSIS — M65341 Trigger finger, right ring finger: Secondary | ICD-10-CM | POA: Diagnosis not present

## 2018-05-13 DIAGNOSIS — M13841 Other specified arthritis, right hand: Secondary | ICD-10-CM | POA: Diagnosis not present

## 2018-05-13 DIAGNOSIS — M79641 Pain in right hand: Secondary | ICD-10-CM | POA: Diagnosis not present

## 2018-05-18 DIAGNOSIS — R739 Hyperglycemia, unspecified: Secondary | ICD-10-CM | POA: Diagnosis not present

## 2018-05-18 DIAGNOSIS — M5416 Radiculopathy, lumbar region: Secondary | ICD-10-CM | POA: Diagnosis not present

## 2018-05-18 DIAGNOSIS — M19041 Primary osteoarthritis, right hand: Secondary | ICD-10-CM | POA: Diagnosis not present

## 2018-05-18 DIAGNOSIS — M19042 Primary osteoarthritis, left hand: Secondary | ICD-10-CM | POA: Diagnosis not present

## 2018-05-18 DIAGNOSIS — F419 Anxiety disorder, unspecified: Secondary | ICD-10-CM | POA: Diagnosis not present

## 2018-05-18 DIAGNOSIS — E7800 Pure hypercholesterolemia, unspecified: Secondary | ICD-10-CM | POA: Diagnosis not present

## 2018-05-27 DIAGNOSIS — H1045 Other chronic allergic conjunctivitis: Secondary | ICD-10-CM | POA: Diagnosis not present

## 2018-05-27 DIAGNOSIS — J300 Vasomotor rhinitis: Secondary | ICD-10-CM | POA: Diagnosis not present

## 2018-05-27 DIAGNOSIS — J309 Allergic rhinitis, unspecified: Secondary | ICD-10-CM | POA: Diagnosis not present

## 2018-06-15 DIAGNOSIS — M65342 Trigger finger, left ring finger: Secondary | ICD-10-CM | POA: Diagnosis not present

## 2018-06-15 DIAGNOSIS — M65341 Trigger finger, right ring finger: Secondary | ICD-10-CM | POA: Diagnosis not present

## 2018-06-29 DIAGNOSIS — M25642 Stiffness of left hand, not elsewhere classified: Secondary | ICD-10-CM | POA: Diagnosis not present

## 2018-07-03 DIAGNOSIS — M25642 Stiffness of left hand, not elsewhere classified: Secondary | ICD-10-CM | POA: Diagnosis not present

## 2018-07-13 DIAGNOSIS — M25642 Stiffness of left hand, not elsewhere classified: Secondary | ICD-10-CM | POA: Diagnosis not present

## 2018-07-13 DIAGNOSIS — Z4789 Encounter for other orthopedic aftercare: Secondary | ICD-10-CM | POA: Diagnosis not present

## 2018-07-13 DIAGNOSIS — Z1231 Encounter for screening mammogram for malignant neoplasm of breast: Secondary | ICD-10-CM | POA: Diagnosis not present

## 2018-07-13 DIAGNOSIS — M79641 Pain in right hand: Secondary | ICD-10-CM | POA: Diagnosis not present

## 2018-07-13 DIAGNOSIS — M65342 Trigger finger, left ring finger: Secondary | ICD-10-CM | POA: Diagnosis not present

## 2018-08-18 DIAGNOSIS — H2513 Age-related nuclear cataract, bilateral: Secondary | ICD-10-CM | POA: Diagnosis not present

## 2018-08-18 DIAGNOSIS — H16223 Keratoconjunctivitis sicca, not specified as Sjogren's, bilateral: Secondary | ICD-10-CM | POA: Diagnosis not present

## 2018-08-18 DIAGNOSIS — H04123 Dry eye syndrome of bilateral lacrimal glands: Secondary | ICD-10-CM | POA: Diagnosis not present

## 2018-10-01 DIAGNOSIS — R739 Hyperglycemia, unspecified: Secondary | ICD-10-CM | POA: Diagnosis not present

## 2018-10-01 DIAGNOSIS — M5416 Radiculopathy, lumbar region: Secondary | ICD-10-CM | POA: Diagnosis not present

## 2018-10-01 DIAGNOSIS — E7800 Pure hypercholesterolemia, unspecified: Secondary | ICD-10-CM | POA: Diagnosis not present

## 2018-10-01 DIAGNOSIS — M19041 Primary osteoarthritis, right hand: Secondary | ICD-10-CM | POA: Diagnosis not present

## 2018-10-01 DIAGNOSIS — M19042 Primary osteoarthritis, left hand: Secondary | ICD-10-CM | POA: Diagnosis not present

## 2018-10-01 DIAGNOSIS — F419 Anxiety disorder, unspecified: Secondary | ICD-10-CM | POA: Diagnosis not present

## 2018-12-15 DIAGNOSIS — N3000 Acute cystitis without hematuria: Secondary | ICD-10-CM | POA: Diagnosis not present

## 2019-02-16 DIAGNOSIS — M19042 Primary osteoarthritis, left hand: Secondary | ICD-10-CM | POA: Diagnosis not present

## 2019-02-16 DIAGNOSIS — M19041 Primary osteoarthritis, right hand: Secondary | ICD-10-CM | POA: Diagnosis not present

## 2019-02-16 DIAGNOSIS — F419 Anxiety disorder, unspecified: Secondary | ICD-10-CM | POA: Diagnosis not present

## 2019-02-16 DIAGNOSIS — E7800 Pure hypercholesterolemia, unspecified: Secondary | ICD-10-CM | POA: Diagnosis not present

## 2019-02-16 DIAGNOSIS — M5416 Radiculopathy, lumbar region: Secondary | ICD-10-CM | POA: Diagnosis not present

## 2019-02-16 DIAGNOSIS — N3090 Cystitis, unspecified without hematuria: Secondary | ICD-10-CM | POA: Diagnosis not present

## 2019-02-16 DIAGNOSIS — R739 Hyperglycemia, unspecified: Secondary | ICD-10-CM | POA: Diagnosis not present

## 2019-02-17 DIAGNOSIS — F419 Anxiety disorder, unspecified: Secondary | ICD-10-CM | POA: Diagnosis not present

## 2019-02-17 DIAGNOSIS — E7800 Pure hypercholesterolemia, unspecified: Secondary | ICD-10-CM | POA: Diagnosis not present

## 2019-02-17 DIAGNOSIS — N3090 Cystitis, unspecified without hematuria: Secondary | ICD-10-CM | POA: Diagnosis not present

## 2019-02-17 DIAGNOSIS — R739 Hyperglycemia, unspecified: Secondary | ICD-10-CM | POA: Diagnosis not present

## 2019-02-17 DIAGNOSIS — M19042 Primary osteoarthritis, left hand: Secondary | ICD-10-CM | POA: Diagnosis not present

## 2019-02-17 DIAGNOSIS — Z1211 Encounter for screening for malignant neoplasm of colon: Secondary | ICD-10-CM | POA: Diagnosis not present

## 2019-02-17 DIAGNOSIS — M5416 Radiculopathy, lumbar region: Secondary | ICD-10-CM | POA: Diagnosis not present

## 2019-02-17 DIAGNOSIS — M19041 Primary osteoarthritis, right hand: Secondary | ICD-10-CM | POA: Diagnosis not present

## 2019-04-08 DIAGNOSIS — N958 Other specified menopausal and perimenopausal disorders: Secondary | ICD-10-CM | POA: Diagnosis not present

## 2019-04-08 DIAGNOSIS — Z681 Body mass index (BMI) 19 or less, adult: Secondary | ICD-10-CM | POA: Diagnosis not present

## 2019-04-08 DIAGNOSIS — M816 Localized osteoporosis [Lequesne]: Secondary | ICD-10-CM | POA: Diagnosis not present

## 2019-04-08 DIAGNOSIS — Z124 Encounter for screening for malignant neoplasm of cervix: Secondary | ICD-10-CM | POA: Diagnosis not present

## 2019-04-08 DIAGNOSIS — J329 Chronic sinusitis, unspecified: Secondary | ICD-10-CM | POA: Diagnosis not present

## 2019-04-09 DIAGNOSIS — Z23 Encounter for immunization: Secondary | ICD-10-CM | POA: Diagnosis not present

## 2019-04-27 DIAGNOSIS — H6993 Unspecified Eustachian tube disorder, bilateral: Secondary | ICD-10-CM | POA: Insufficient documentation

## 2019-04-27 DIAGNOSIS — J302 Other seasonal allergic rhinitis: Secondary | ICD-10-CM | POA: Diagnosis not present

## 2019-04-27 DIAGNOSIS — H6983 Other specified disorders of Eustachian tube, bilateral: Secondary | ICD-10-CM | POA: Diagnosis not present

## 2019-06-01 DIAGNOSIS — L309 Dermatitis, unspecified: Secondary | ICD-10-CM | POA: Diagnosis not present

## 2019-06-01 DIAGNOSIS — L503 Dermatographic urticaria: Secondary | ICD-10-CM | POA: Diagnosis not present

## 2019-06-01 DIAGNOSIS — Z23 Encounter for immunization: Secondary | ICD-10-CM | POA: Diagnosis not present

## 2019-06-01 DIAGNOSIS — L821 Other seborrheic keratosis: Secondary | ICD-10-CM | POA: Diagnosis not present

## 2019-06-01 DIAGNOSIS — Z85828 Personal history of other malignant neoplasm of skin: Secondary | ICD-10-CM | POA: Diagnosis not present

## 2019-06-16 DIAGNOSIS — M79644 Pain in right finger(s): Secondary | ICD-10-CM | POA: Diagnosis not present

## 2019-06-16 DIAGNOSIS — M180 Bilateral primary osteoarthritis of first carpometacarpal joints: Secondary | ICD-10-CM | POA: Diagnosis not present

## 2019-07-15 DIAGNOSIS — Z1231 Encounter for screening mammogram for malignant neoplasm of breast: Secondary | ICD-10-CM | POA: Diagnosis not present

## 2019-09-02 ENCOUNTER — Ambulatory Visit: Payer: Medicare Other | Attending: Internal Medicine

## 2019-09-02 DIAGNOSIS — Z23 Encounter for immunization: Secondary | ICD-10-CM | POA: Insufficient documentation

## 2019-09-02 NOTE — Progress Notes (Signed)
   Covid-19 Vaccination Clinic  Name:  Kaylynn Triola    MRN: [id number] DOB: September 28, 1950  09/02/2019  Ms. Cuffe was observed post Covid-19 immunization for 15 minutes without incidence. She was provided with Vaccine Information Sheet and instruction to access the V-Safe system.   Ms. Cela was instructed to call 911 with any severe reactions post vaccine: [name] Difficulty breathing  . Swelling of your face and throat  . A fast heartbeat  . A bad rash all over your body  . Dizziness and weakness    Immunizations Administered    Name Date Dose VIS Date Route   Pfizer COVID-19 Vaccine 09/02/2019 10:46 AM 0.3 mL 07/23/2019 Intramuscular   Manufacturer: [place]   Lot: [id number]   [place]: [id number]

## 2019-09-06 ENCOUNTER — Ambulatory Visit: Payer: 59

## 2019-09-23 ENCOUNTER — Ambulatory Visit: Payer: Medicare Other | Attending: Internal Medicine

## 2019-09-23 DIAGNOSIS — Z23 Encounter for immunization: Secondary | ICD-10-CM | POA: Insufficient documentation

## 2019-09-23 NOTE — Progress Notes (Signed)
   Covid-19 Vaccination Clinic  Name:  Tina Murray    MRN: [id number] DOB: 08/27/1950  09/23/2019  [name] was observed post Covid-19 immunization for 15 minutes without incidence. She was provided with Vaccine Information Sheet and instruction to access the V-Safe system.   [name] was instructed to call 911 with any severe reactions post vaccine: [name] Difficulty breathing  . Swelling of your face and throat  . A fast heartbeat  . A bad rash all over your body  . Dizziness and weakness    Immunizations Administered    Name Date Dose VIS Date Route   Pfizer COVID-19 Vaccine 09/23/2019 10:11 AM 0.3 mL 07/23/2019 Intramuscular   Manufacturer: [place]   Lot: [id number]   [place]: [id number]

## 2019-10-11 DIAGNOSIS — L299 Pruritus, unspecified: Secondary | ICD-10-CM | POA: Diagnosis not present

## 2019-10-11 DIAGNOSIS — N951 Menopausal and female climacteric states: Secondary | ICD-10-CM | POA: Diagnosis not present

## 2019-10-11 DIAGNOSIS — M19041 Primary osteoarthritis, right hand: Secondary | ICD-10-CM | POA: Diagnosis not present

## 2019-10-11 DIAGNOSIS — R739 Hyperglycemia, unspecified: Secondary | ICD-10-CM | POA: Diagnosis not present

## 2019-10-11 DIAGNOSIS — Z1589 Genetic susceptibility to other disease: Secondary | ICD-10-CM | POA: Diagnosis not present

## 2019-10-11 DIAGNOSIS — M19042 Primary osteoarthritis, left hand: Secondary | ICD-10-CM | POA: Diagnosis not present

## 2019-10-11 DIAGNOSIS — E7800 Pure hypercholesterolemia, unspecified: Secondary | ICD-10-CM | POA: Diagnosis not present

## 2019-10-15 DIAGNOSIS — M19041 Primary osteoarthritis, right hand: Secondary | ICD-10-CM | POA: Diagnosis not present

## 2019-10-15 DIAGNOSIS — E7800 Pure hypercholesterolemia, unspecified: Secondary | ICD-10-CM | POA: Diagnosis not present

## 2019-10-15 DIAGNOSIS — M19042 Primary osteoarthritis, left hand: Secondary | ICD-10-CM | POA: Diagnosis not present

## 2019-10-21 DIAGNOSIS — E7800 Pure hypercholesterolemia, unspecified: Secondary | ICD-10-CM | POA: Diagnosis not present

## 2019-11-18 DIAGNOSIS — K219 Gastro-esophageal reflux disease without esophagitis: Secondary | ICD-10-CM | POA: Diagnosis not present

## 2019-11-18 DIAGNOSIS — J3089 Other allergic rhinitis: Secondary | ICD-10-CM | POA: Diagnosis not present

## 2019-11-18 DIAGNOSIS — H6983 Other specified disorders of Eustachian tube, bilateral: Secondary | ICD-10-CM | POA: Diagnosis not present

## 2019-11-18 DIAGNOSIS — R42 Dizziness and giddiness: Secondary | ICD-10-CM | POA: Diagnosis not present

## 2019-12-14 DIAGNOSIS — H8111 Benign paroxysmal vertigo, right ear: Secondary | ICD-10-CM | POA: Insufficient documentation

## 2020-01-04 DIAGNOSIS — H52223 Regular astigmatism, bilateral: Secondary | ICD-10-CM | POA: Diagnosis not present

## 2020-01-04 DIAGNOSIS — H2513 Age-related nuclear cataract, bilateral: Secondary | ICD-10-CM | POA: Diagnosis not present

## 2020-01-04 DIAGNOSIS — H5203 Hypermetropia, bilateral: Secondary | ICD-10-CM | POA: Diagnosis not present

## 2020-01-04 DIAGNOSIS — H524 Presbyopia: Secondary | ICD-10-CM | POA: Diagnosis not present

## 2020-01-04 DIAGNOSIS — H16223 Keratoconjunctivitis sicca, not specified as Sjogren's, bilateral: Secondary | ICD-10-CM | POA: Diagnosis not present

## 2020-01-04 DIAGNOSIS — H04123 Dry eye syndrome of bilateral lacrimal glands: Secondary | ICD-10-CM | POA: Diagnosis not present

## 2020-01-13 DIAGNOSIS — R739 Hyperglycemia, unspecified: Secondary | ICD-10-CM | POA: Diagnosis not present

## 2020-01-13 DIAGNOSIS — E7800 Pure hypercholesterolemia, unspecified: Secondary | ICD-10-CM | POA: Diagnosis not present

## 2020-01-20 DIAGNOSIS — Z1589 Genetic susceptibility to other disease: Secondary | ICD-10-CM | POA: Diagnosis not present

## 2020-01-20 DIAGNOSIS — F411 Generalized anxiety disorder: Secondary | ICD-10-CM | POA: Diagnosis not present

## 2020-01-20 DIAGNOSIS — R519 Headache, unspecified: Secondary | ICD-10-CM | POA: Diagnosis not present

## 2020-01-20 DIAGNOSIS — R739 Hyperglycemia, unspecified: Secondary | ICD-10-CM | POA: Diagnosis not present

## 2020-01-20 DIAGNOSIS — M419 Scoliosis, unspecified: Secondary | ICD-10-CM | POA: Diagnosis not present

## 2020-01-20 DIAGNOSIS — M19041 Primary osteoarthritis, right hand: Secondary | ICD-10-CM | POA: Diagnosis not present

## 2020-01-20 DIAGNOSIS — E7800 Pure hypercholesterolemia, unspecified: Secondary | ICD-10-CM | POA: Diagnosis not present

## 2020-01-20 DIAGNOSIS — M19042 Primary osteoarthritis, left hand: Secondary | ICD-10-CM | POA: Diagnosis not present

## 2020-03-02 ENCOUNTER — Encounter: Payer: Self-pay | Admitting: *Deleted

## 2020-03-03 ENCOUNTER — Ambulatory Visit (INDEPENDENT_AMBULATORY_CARE_PROVIDER_SITE_OTHER): Payer: Medicare Other | Admitting: Neurology

## 2020-03-03 ENCOUNTER — Encounter: Payer: Self-pay | Admitting: Neurology

## 2020-03-03 ENCOUNTER — Other Ambulatory Visit: Payer: Self-pay

## 2020-03-03 VITALS — BP 113/76 | HR 75 | Ht 65.0 in | Wt 118.5 lb

## 2020-03-03 DIAGNOSIS — M542 Cervicalgia: Secondary | ICD-10-CM

## 2020-03-03 DIAGNOSIS — G8929 Other chronic pain: Secondary | ICD-10-CM | POA: Diagnosis not present

## 2020-03-03 DIAGNOSIS — G43709 Chronic migraine without aura, not intractable, without status migrainosus: Secondary | ICD-10-CM

## 2020-03-03 DIAGNOSIS — IMO0002 Reserved for concepts with insufficient information to code with codable children: Secondary | ICD-10-CM | POA: Insufficient documentation

## 2020-03-03 MED ORDER — SUMATRIPTAN SUCCINATE 25 MG PO TABS: 25.0000 mg | ORAL_TABLET | ORAL | 6 refills | Status: DC | PRN

## 2020-03-03 MED ORDER — ONDANSETRON 4 MG PO TBDP: 4.0000 mg | ORAL_TABLET | Freq: Three times a day (TID) | ORAL | 6 refills | Status: DC | PRN

## 2020-03-03 NOTE — Patient Instructions (Signed)
Magnesium oxide 400mg  Riboflavin 100mg    Twice a day as migraine prevention.  Imitrex 25mg  as needed. zofran as needed for nauseous Aleve as needed.

## 2020-03-03 NOTE — Progress Notes (Signed)
HISTORICAL  Tina Murray is a 69 year old female, seen in request by her primary care physician Dr. Jacelyn Grip, [place] for evaluation of chronic migraine headaches,   I reviewed and summarized the referring note.  Past medical history anxiety Hyperlipidemia  She reported long history of chronic migraine headaches, especially perimenopause period of time, was treated at [facility], but she forgot the medication  Her headache overall has much improved after menopause, but around 2020, she began to have frequent headaches again, now 1-3 times each week, sometimes she woke up with holoacranial pressure headache, was pounding, light noise sensitivity, occasionally nausea, she has tried over-the-counter medication Advil with limited help, her headache can last for hours or whole day, and sometimes interrupt her schedule  She continue complains of neck, low back pain, we personally reviewed MRI cervical spine May 2019, multilevel degenerative changes, most noticeable at C4-5, degenerative changes causing moderate severe left foraminal narrowing, C5-6, retrolisthesis, degenerative changes cause moderate spinal stenosis,   I saw her previously in April 2019 for evaluation of muscle weakness, low back pain, after she fell on ice,  MRI of lumbar showed multilevel degenerative disc disease, with moderate foraminal stenosis bilaterally L5-S1, facet hypertrophy, vertebral spurring,  EMG nerve conduction study in May 2019 showed no significant abnormality, in specific, there is no evidence of large fiber peripheral neuropathy, right cervical radiculopathy.  She continue complains of significant arthritic pain, recently had finger surgery which has helped her trigger finger and finger pain  REVIEW OF SYSTEMS: Full 14 system review of systems performed and notable only for as above All other review of systems were negative.  ALLERGIES: No Known Allergies  HOME MEDICATIONS: Current  Outpatient Medications  Medication Sig Dispense Refill  . diclofenac sodium (VOLTAREN) 1 % GEL diclofenac 1 % topical gel  APPLY 2 GRAM TO THE AFFECTED AREA(S) BY TOPICAL ROUTE 4 TIMES PER DAY    . famotidine (PEPCID) 20 MG tablet Take 20-40 mg by mouth daily.     . fluticasone (FLONASE) 50 MCG/ACT nasal spray Place 1 spray into both nostrils daily.    . Loratadine (CLARITIN) 10 MG CAPS Take 10 mg by mouth daily.    [name] LOVASTATIN PO Take 5 mg by mouth daily.    . naproxen sodium (ALEVE) 220 MG tablet Take 220 mg by mouth as needed.    . phenylephrine (SUDAFED PE) 10 MG TABS tablet Take 10 mg by mouth as needed.     No current facility-administered medications for this visit.    PAST MEDICAL HISTORY: Past Medical History:  Diagnosis Date  . DDD (degenerative disc disease), lumbar   . GAD (generalized anxiety disorder)   . Headache   . Hypercholesterolemia   . Hyperglycemia   . Hyperlipemia   . Lumbar radiculopathy   . Migraine   . Osteoarthritis   . Paresthesia of foot    right  . Sacroiliac joint pain   . Sciatica of right side   . Scoliosis   . Seasonal allergies     PAST SURGICAL HISTORY: Past Surgical History:  Procedure Laterality Date  . OVARIAN CYST REMOVAL Left 1977    FAMILY HISTORY: Family History  Problem Relation Age of Onset  . Stroke Mother        age 44  . COPD Mother   . Other Father        age 69 - complications from surgery  . Hyperlipidemia Brother   . Heart attack Brother  age 31    SOCIAL HISTORY: Social History   Socioeconomic History  . Marital status: Married    Spouse name: Not on file  . Number of children: 0  . Years of education: 88  . Highest education level: Associate degree: occupational, Hotel manager, or vocational program  Occupational History  . Occupation: Retired  Tobacco Use  . Smoking status: Former Smoker    Quit date: 03/03/1979    Years since quitting: 41.0  . Smokeless tobacco: Never Used  Vaping Use  .  Vaping Use: Never used  Substance and Sexual Activity  . Alcohol use: Yes    Comment: social  . Drug use: Never  . Sexual activity: Not on file  Other Topics Concern  . Not on file  Social History Narrative   Lives at home with husband.   Right-handed.   One cup caffeine daily.   Social Determinants of Health   Financial Resource Strain:   . Difficulty of Paying Living Expenses:   Food Insecurity:   . Worried About Charity fundraiser in the Last Year:   . Arboriculturist in the Last Year:   Transportation Needs:   . Film/video editor (Medical):   [name] Lack of Transportation (Non-Medical):   Physical Activity:   . Days of Exercise per Week:   . Minutes of Exercise per Session:   Stress:   . Feeling of Stress :   Social Connections:   . Frequency of Communication with Friends and Family:   . Frequency of Social Gatherings with Friends and Family:   . Attends Religious Services:   . Active Member of Clubs or Organizations:   . Attends Archivist Meetings:   [name] Marital Status:   Intimate Partner Violence:   . Fear of Current or Ex-Partner:   . Emotionally Abused:   [name] Physically Abused:   . Sexually Abused:      PHYSICAL EXAM   Vitals:   03/03/20 1000  BP: 113/76  Pulse: 75  Weight: 118 lb 8 oz (53.8 kg)  Height: 5\' 5"  (1.651 m)   Not recorded     Body mass index is 19.72 kg/m.  PHYSICAL EXAMNIATION:  Gen: NAD, conversant, well nourised, well groomed                     Cardiovascular: Regular rate rhythm, no peripheral edema, warm, nontender. Eyes: Conjunctivae clear without exudates or hemorrhage Neck: Supple, no carotid bruits. Pulmonary: Clear to auscultation bilaterally   NEUROLOGICAL EXAM:  MENTAL STATUS: Speech:    Speech is normal; fluent and spontaneous with normal comprehension.  Cognition:     Orientation to time, place and person     Normal recent and remote memory     Normal Attention span and concentration     Normal  Language, naming, repeating,spontaneous speech     Fund of knowledge   CRANIAL NERVES: CN II: Visual fields are full to confrontation. Pupils are round equal and briskly reactive to light. CN III, IV, VI: extraocular movement are normal. No ptosis. CN V: Facial sensation is intact to light touch CN VII: Face is symmetric with normal eye closure  CN VIII: Hearing is normal to causal conversation. CN IX, X: Phonation is normal. CN XI: Head turning and shoulder shrug are intact  MOTOR: There is no pronator drift of out-stretched arms. Muscle bulk and tone are normal. Muscle strength is normal.  REFLEXES: Reflexes are 2+ and symmetric at  the biceps, triceps, knees, and ankles. Plantar responses are flexor.  SENSORY: Intact to light touch, pinprick and vibratory sensation are intact in fingers and toes.  COORDINATION: There is no trunk or limb dysmetria noted.  GAIT/STANCE: Posture is normal. Gait is steady with normal steps, base, arm swing, and turning. Heel and toe walking are normal. Tandem gait is normal.  Romberg is absent.   DIAGNOSTIC DATA (LABS, IMAGING, TESTING) - I reviewed patient records, labs, notes, testing and imaging myself where available.   ASSESSMENT AND PLAN  Tina Murray is a 69 y.o. female   Chronic migraine headaches  I have suggested over-the-counter preventive medication magnesium oxide, riboflavin 100 mg twice a day  Imitrex 25 mg as needed, may combine it with Aleve, Zofran for severe headaches Chronic neck, low back pain,  Stretching exercise, warm compression   Marcial Pacas, M.D. Ph.D.  [facility] [address], [place], [place] [address] Ph: [phone] Fax: [phone]  CC:  [name], [place],  [place] [address]

## 2020-03-09 ENCOUNTER — Institutional Professional Consult (permissible substitution): Payer: Medicare Other | Admitting: Neurology

## 2020-03-13 DIAGNOSIS — H2513 Age-related nuclear cataract, bilateral: Secondary | ICD-10-CM | POA: Diagnosis not present

## 2020-03-13 DIAGNOSIS — H2511 Age-related nuclear cataract, right eye: Secondary | ICD-10-CM | POA: Diagnosis not present

## 2020-03-13 DIAGNOSIS — H25013 Cortical age-related cataract, bilateral: Secondary | ICD-10-CM | POA: Diagnosis not present

## 2020-03-13 DIAGNOSIS — H25043 Posterior subcapsular polar age-related cataract, bilateral: Secondary | ICD-10-CM | POA: Diagnosis not present

## 2020-03-21 DIAGNOSIS — H25012 Cortical age-related cataract, left eye: Secondary | ICD-10-CM | POA: Diagnosis not present

## 2020-03-21 DIAGNOSIS — H25042 Posterior subcapsular polar age-related cataract, left eye: Secondary | ICD-10-CM | POA: Diagnosis not present

## 2020-03-21 DIAGNOSIS — H2511 Age-related nuclear cataract, right eye: Secondary | ICD-10-CM | POA: Diagnosis not present

## 2020-03-21 DIAGNOSIS — H2512 Age-related nuclear cataract, left eye: Secondary | ICD-10-CM | POA: Diagnosis not present

## 2020-03-28 DIAGNOSIS — H2512 Age-related nuclear cataract, left eye: Secondary | ICD-10-CM | POA: Diagnosis not present

## 2020-04-03 DIAGNOSIS — H16221 Keratoconjunctivitis sicca, not specified as Sjogren's, right eye: Secondary | ICD-10-CM | POA: Diagnosis not present

## 2020-04-03 DIAGNOSIS — H16222 Keratoconjunctivitis sicca, not specified as Sjogren's, left eye: Secondary | ICD-10-CM | POA: Diagnosis not present

## 2020-04-14 DIAGNOSIS — H16223 Keratoconjunctivitis sicca, not specified as Sjogren's, bilateral: Secondary | ICD-10-CM | POA: Diagnosis not present

## 2020-04-14 DIAGNOSIS — H524 Presbyopia: Secondary | ICD-10-CM | POA: Diagnosis not present

## 2020-04-14 DIAGNOSIS — H26493 Other secondary cataract, bilateral: Secondary | ICD-10-CM | POA: Diagnosis not present

## 2020-04-14 DIAGNOSIS — H04123 Dry eye syndrome of bilateral lacrimal glands: Secondary | ICD-10-CM | POA: Diagnosis not present

## 2020-04-14 DIAGNOSIS — H52222 Regular astigmatism, left eye: Secondary | ICD-10-CM | POA: Diagnosis not present

## 2020-04-15 DIAGNOSIS — Z23 Encounter for immunization: Secondary | ICD-10-CM | POA: Diagnosis not present

## 2020-04-25 DIAGNOSIS — M419 Scoliosis, unspecified: Secondary | ICD-10-CM | POA: Diagnosis not present

## 2020-04-25 DIAGNOSIS — Z1589 Genetic susceptibility to other disease: Secondary | ICD-10-CM | POA: Diagnosis not present

## 2020-04-25 DIAGNOSIS — R519 Headache, unspecified: Secondary | ICD-10-CM | POA: Diagnosis not present

## 2020-04-25 DIAGNOSIS — M19041 Primary osteoarthritis, right hand: Secondary | ICD-10-CM | POA: Diagnosis not present

## 2020-04-25 DIAGNOSIS — M19042 Primary osteoarthritis, left hand: Secondary | ICD-10-CM | POA: Diagnosis not present

## 2020-04-25 DIAGNOSIS — E7800 Pure hypercholesterolemia, unspecified: Secondary | ICD-10-CM | POA: Diagnosis not present

## 2020-04-25 DIAGNOSIS — F411 Generalized anxiety disorder: Secondary | ICD-10-CM | POA: Diagnosis not present

## 2020-04-25 DIAGNOSIS — R739 Hyperglycemia, unspecified: Secondary | ICD-10-CM | POA: Diagnosis not present

## 2020-05-04 DIAGNOSIS — E7800 Pure hypercholesterolemia, unspecified: Secondary | ICD-10-CM | POA: Diagnosis not present

## 2020-05-04 DIAGNOSIS — M419 Scoliosis, unspecified: Secondary | ICD-10-CM | POA: Diagnosis not present

## 2020-05-04 DIAGNOSIS — Z1589 Genetic susceptibility to other disease: Secondary | ICD-10-CM | POA: Diagnosis not present

## 2020-05-04 DIAGNOSIS — R739 Hyperglycemia, unspecified: Secondary | ICD-10-CM | POA: Diagnosis not present

## 2020-05-04 DIAGNOSIS — R519 Headache, unspecified: Secondary | ICD-10-CM | POA: Diagnosis not present

## 2020-05-04 DIAGNOSIS — F411 Generalized anxiety disorder: Secondary | ICD-10-CM | POA: Diagnosis not present

## 2020-05-04 DIAGNOSIS — M19042 Primary osteoarthritis, left hand: Secondary | ICD-10-CM | POA: Diagnosis not present

## 2020-05-04 DIAGNOSIS — M19041 Primary osteoarthritis, right hand: Secondary | ICD-10-CM | POA: Diagnosis not present

## 2020-05-06 DIAGNOSIS — Z23 Encounter for immunization: Secondary | ICD-10-CM | POA: Diagnosis not present

## 2020-05-22 ENCOUNTER — Ambulatory Visit: Payer: 59

## 2020-05-31 DIAGNOSIS — L578 Other skin changes due to chronic exposure to nonionizing radiation: Secondary | ICD-10-CM | POA: Diagnosis not present

## 2020-05-31 DIAGNOSIS — L821 Other seborrheic keratosis: Secondary | ICD-10-CM | POA: Diagnosis not present

## 2020-05-31 DIAGNOSIS — Z85828 Personal history of other malignant neoplasm of skin: Secondary | ICD-10-CM | POA: Diagnosis not present

## 2020-05-31 DIAGNOSIS — D225 Melanocytic nevi of trunk: Secondary | ICD-10-CM | POA: Diagnosis not present

## 2020-05-31 DIAGNOSIS — L2084 Intrinsic (allergic) eczema: Secondary | ICD-10-CM | POA: Diagnosis not present

## 2020-05-31 DIAGNOSIS — L658 Other specified nonscarring hair loss: Secondary | ICD-10-CM | POA: Diagnosis not present

## 2020-06-26 DIAGNOSIS — Z6820 Body mass index (BMI) 20.0-20.9, adult: Secondary | ICD-10-CM | POA: Diagnosis not present

## 2020-06-26 DIAGNOSIS — Z01419 Encounter for gynecological examination (general) (routine) without abnormal findings: Secondary | ICD-10-CM | POA: Diagnosis not present

## 2020-07-10 DIAGNOSIS — H16223 Keratoconjunctivitis sicca, not specified as Sjogren's, bilateral: Secondary | ICD-10-CM | POA: Diagnosis not present

## 2020-07-10 DIAGNOSIS — Z961 Presence of intraocular lens: Secondary | ICD-10-CM | POA: Diagnosis not present

## 2020-07-10 DIAGNOSIS — H26492 Other secondary cataract, left eye: Secondary | ICD-10-CM | POA: Diagnosis not present

## 2020-07-19 DIAGNOSIS — B351 Tinea unguium: Secondary | ICD-10-CM | POA: Diagnosis not present

## 2020-07-21 DIAGNOSIS — H26493 Other secondary cataract, bilateral: Secondary | ICD-10-CM | POA: Diagnosis not present

## 2020-07-24 DIAGNOSIS — H26491 Other secondary cataract, right eye: Secondary | ICD-10-CM | POA: Diagnosis not present

## 2020-07-25 DIAGNOSIS — R309 Painful micturition, unspecified: Secondary | ICD-10-CM | POA: Diagnosis not present

## 2020-07-25 DIAGNOSIS — N390 Urinary tract infection, site not specified: Secondary | ICD-10-CM | POA: Diagnosis not present

## 2020-08-01 DIAGNOSIS — H04123 Dry eye syndrome of bilateral lacrimal glands: Secondary | ICD-10-CM | POA: Diagnosis not present

## 2020-08-01 DIAGNOSIS — H16223 Keratoconjunctivitis sicca, not specified as Sjogren's, bilateral: Secondary | ICD-10-CM | POA: Diagnosis not present

## 2020-08-01 DIAGNOSIS — H43821 Vitreomacular adhesion, right eye: Secondary | ICD-10-CM | POA: Diagnosis not present

## 2020-08-01 DIAGNOSIS — H26493 Other secondary cataract, bilateral: Secondary | ICD-10-CM | POA: Diagnosis not present

## 2020-09-19 DIAGNOSIS — Z1231 Encounter for screening mammogram for malignant neoplasm of breast: Secondary | ICD-10-CM | POA: Diagnosis not present

## 2020-10-04 DIAGNOSIS — H43821 Vitreomacular adhesion, right eye: Secondary | ICD-10-CM | POA: Diagnosis not present

## 2020-10-04 DIAGNOSIS — F411 Generalized anxiety disorder: Secondary | ICD-10-CM | POA: Diagnosis not present

## 2020-10-04 DIAGNOSIS — R519 Headache, unspecified: Secondary | ICD-10-CM | POA: Diagnosis not present

## 2020-10-04 DIAGNOSIS — H16223 Keratoconjunctivitis sicca, not specified as Sjogren's, bilateral: Secondary | ICD-10-CM | POA: Diagnosis not present

## 2020-10-04 DIAGNOSIS — H26493 Other secondary cataract, bilateral: Secondary | ICD-10-CM | POA: Diagnosis not present

## 2020-10-04 DIAGNOSIS — H04123 Dry eye syndrome of bilateral lacrimal glands: Secondary | ICD-10-CM | POA: Diagnosis not present

## 2020-10-04 DIAGNOSIS — E7800 Pure hypercholesterolemia, unspecified: Secondary | ICD-10-CM | POA: Diagnosis not present

## 2020-10-04 DIAGNOSIS — M19042 Primary osteoarthritis, left hand: Secondary | ICD-10-CM | POA: Diagnosis not present

## 2020-10-04 DIAGNOSIS — M19041 Primary osteoarthritis, right hand: Secondary | ICD-10-CM | POA: Diagnosis not present

## 2020-10-04 DIAGNOSIS — R739 Hyperglycemia, unspecified: Secondary | ICD-10-CM | POA: Diagnosis not present

## 2020-10-04 DIAGNOSIS — Z1589 Genetic susceptibility to other disease: Secondary | ICD-10-CM | POA: Diagnosis not present

## 2020-10-04 DIAGNOSIS — M419 Scoliosis, unspecified: Secondary | ICD-10-CM | POA: Diagnosis not present

## 2020-10-04 DIAGNOSIS — R300 Dysuria: Secondary | ICD-10-CM | POA: Diagnosis not present

## 2020-10-10 ENCOUNTER — Ambulatory Visit: Payer: 59

## 2020-10-12 DIAGNOSIS — F411 Generalized anxiety disorder: Secondary | ICD-10-CM | POA: Diagnosis not present

## 2020-10-12 DIAGNOSIS — M419 Scoliosis, unspecified: Secondary | ICD-10-CM | POA: Diagnosis not present

## 2020-10-12 DIAGNOSIS — R739 Hyperglycemia, unspecified: Secondary | ICD-10-CM | POA: Diagnosis not present

## 2020-10-12 DIAGNOSIS — E7800 Pure hypercholesterolemia, unspecified: Secondary | ICD-10-CM | POA: Diagnosis not present

## 2020-10-12 DIAGNOSIS — M19042 Primary osteoarthritis, left hand: Secondary | ICD-10-CM | POA: Diagnosis not present

## 2020-10-12 DIAGNOSIS — M62838 Other muscle spasm: Secondary | ICD-10-CM | POA: Diagnosis not present

## 2020-10-12 DIAGNOSIS — F4320 Adjustment disorder, unspecified: Secondary | ICD-10-CM | POA: Diagnosis not present

## 2020-10-12 DIAGNOSIS — Z1589 Genetic susceptibility to other disease: Secondary | ICD-10-CM | POA: Diagnosis not present

## 2020-10-12 DIAGNOSIS — M19041 Primary osteoarthritis, right hand: Secondary | ICD-10-CM | POA: Diagnosis not present

## 2020-10-23 DIAGNOSIS — H43822 Vitreomacular adhesion, left eye: Secondary | ICD-10-CM | POA: Diagnosis not present

## 2020-10-23 DIAGNOSIS — H43813 Vitreous degeneration, bilateral: Secondary | ICD-10-CM | POA: Diagnosis not present

## 2020-11-08 DIAGNOSIS — Z23 Encounter for immunization: Secondary | ICD-10-CM | POA: Diagnosis not present

## 2020-11-21 DIAGNOSIS — J9801 Acute bronchospasm: Secondary | ICD-10-CM | POA: Diagnosis not present

## 2020-11-21 DIAGNOSIS — R062 Wheezing: Secondary | ICD-10-CM | POA: Diagnosis not present

## 2020-11-21 DIAGNOSIS — R0981 Nasal congestion: Secondary | ICD-10-CM | POA: Diagnosis not present

## 2020-11-21 DIAGNOSIS — R059 Cough, unspecified: Secondary | ICD-10-CM | POA: Diagnosis not present

## 2020-11-21 DIAGNOSIS — Z03818 Encounter for observation for suspected exposure to other biological agents ruled out: Secondary | ICD-10-CM | POA: Diagnosis not present

## 2020-11-28 DIAGNOSIS — R059 Cough, unspecified: Secondary | ICD-10-CM | POA: Diagnosis not present

## 2020-11-28 DIAGNOSIS — J209 Acute bronchitis, unspecified: Secondary | ICD-10-CM | POA: Diagnosis not present

## 2020-12-18 DIAGNOSIS — H43393 Other vitreous opacities, bilateral: Secondary | ICD-10-CM | POA: Diagnosis not present

## 2020-12-18 DIAGNOSIS — H43813 Vitreous degeneration, bilateral: Secondary | ICD-10-CM | POA: Diagnosis not present

## 2020-12-28 ENCOUNTER — Ambulatory Visit: Payer: Medicare Other | Admitting: Neurology

## 2021-01-05 DIAGNOSIS — H43813 Vitreous degeneration, bilateral: Secondary | ICD-10-CM | POA: Diagnosis not present

## 2021-01-05 DIAGNOSIS — H43393 Other vitreous opacities, bilateral: Secondary | ICD-10-CM | POA: Diagnosis not present

## 2021-01-10 ENCOUNTER — Telehealth: Payer: Self-pay | Admitting: Neurology

## 2021-01-10 ENCOUNTER — Encounter: Payer: Self-pay | Admitting: Neurology

## 2021-01-10 ENCOUNTER — Ambulatory Visit (INDEPENDENT_AMBULATORY_CARE_PROVIDER_SITE_OTHER): Payer: Medicare Other | Admitting: Neurology

## 2021-01-10 DIAGNOSIS — M542 Cervicalgia: Secondary | ICD-10-CM

## 2021-01-10 DIAGNOSIS — G8929 Other chronic pain: Secondary | ICD-10-CM

## 2021-01-10 DIAGNOSIS — G43709 Chronic migraine without aura, not intractable, without status migrainosus: Secondary | ICD-10-CM | POA: Diagnosis not present

## 2021-01-10 DIAGNOSIS — F419 Anxiety disorder, unspecified: Secondary | ICD-10-CM | POA: Diagnosis not present

## 2021-01-10 MED ORDER — TIZANIDINE HCL 2 MG PO TABS: 2.0000 mg | ORAL_TABLET | Freq: Three times a day (TID) | ORAL | 3 refills | Status: DC | PRN

## 2021-01-10 NOTE — Progress Notes (Signed)
Chief Complaint  Patient presents with  . Follow-up    Work-in for migraines.      ASSESSMENT AND PLAN  Tina Murray is a 70 y.o. female   Chronic migraine Worsening anxiety Chronic neck pain, known history of cervical spondylosis  No significant abnormality on examination other than brisk reflexes,  Significant tenderness along bilateral levator scapular insertion point and inner corner of the scapula,  Most consistent with musculoskeletal etiology,  I have suggested warm compression of neck and shoulder, neck stretching exercise, massage, as needed NSAIDs, Tylenol,  Add on tizanidine 2 mg as needed, may combine with Imitrex 25 mg as needed for moderate to severe migraine headaches    DIAGNOSTIC DATA (LABS, IMAGING, TESTING) - I reviewed patient records, labs, notes, testing and imaging myself where available.   HISTORICAL  Tina Murray is a 70 year old female, seen in request by her primary care physician Dr. Jacelyn Grip, [place] for evaluation of chronic migraine headaches,  I reviewed and summarized the referring note.  Past medical history anxiety Hyperlipidemia  She reported long history of chronic migraine headaches, especially perimenopause period of time, was treated at [facility], but she forgot the medication  Her headache overall has much improved after menopause, but around 2020, she began to have frequent headaches again, now 1-3 times each week, sometimes she woke up with holoacranial pressure headache, was pounding, light noise sensitivity, occasionally nausea, she has tried over-the-counter medication Advil with limited help, her headache can last for hours or whole day, and sometimes interrupt her schedule  She continue complains of neck, low back pain, we personally reviewed MRI cervical spine May 2019, multilevel degenerative changes, most noticeable at C4-5, degenerative changes causing moderate severe left foraminal narrowing, C5-6,  retrolisthesis, degenerative changes cause moderate spinal stenosis,   I saw her previously in April 2019 for evaluation of muscle weakness, low back pain, after she fell on ice,  MRI of lumbar showed multilevel degenerative disc disease, with moderate foraminal stenosis bilaterally L5-S1, facet hypertrophy, vertebral spurring,  EMG nerve conduction study in May 2019 showed no significant abnormality, in specific, there is no evidence of large fiber peripheral neuropathy, right cervical radiculopathy.  She continue complains of significant arthritic pain, recently had finger surgery which has helped her trigger finger and finger pain  Update June/08/2020 She accompanied by her husband at today's visit, complains of significant anxiety with her husband recent diagnosis of left basal ganglion structural abnormality, she is taking clonazepam low-dose as needed, long history of migraine headaches, now complains of bilateral shoulder pain, often radiating up to become mild to moderate headaches  Yesterday Jan 09, 2021, she also complains of 1 episode of seeing flashing light in her visual field lasting for few minutes, followed by moderate holoacranial headaches, Tylenol only provide limited help  Previous evaluation I have given her prescription of Imitrex 25 mg, she tried it, was not sure about the benefit, prefer not to take too much medications,  Many years ago has tried SSRI treatment for a long history of anxiety, does not want to try it again  We personally reviewed MRI of the cervical spine Dec 12, 2017: Multilevel degenerative changes, most obvious at C4-5, C5-6, C6-7, evidence of moderate canal stenosis at C5-6, with moderate bilateral foraminal narrowing  MRI of lumbar spine multilevel mild degenerative changes, no significant canal stenosis, variable degree of foraminal narrowing, most obvious at left L4-5, and bilateral L5-S1  PHYSICAL EXAM:   There were no vitals filed  for this  visit. Not recorded   Blood pressure 140/85  PHYSICAL EXAMNIATION:  Gen: NAD, conversant, well nourised, well groomed                     Cardiovascular: Regular rate rhythm, no peripheral edema, warm, nontender. Eyes: Conjunctivae clear without exudates or hemorrhage Neck: Supple, no carotid bruits. Pulmonary: Clear to auscultation bilaterally   NEUROLOGICAL EXAM:  MENTAL STATUS: Anxious looking middle-aged female Speech:    Speech is normal; fluent and spontaneous with normal comprehension.  Cognition:     Orientation to time, place and person     Normal recent and remote memory     Normal Attention span and concentration     Normal Language, naming, repeating,spontaneous speech     Fund of knowledge   CRANIAL NERVES: CN II: Visual fields are full to confrontation. Pupils are round equal and briskly reactive to light. CN III, IV, VI: extraocular movement are normal. No ptosis. CN V: Facial sensation is intact to light touch CN VII: Face is symmetric with normal eye closure  CN VIII: Hearing is normal to causal conversation. CN IX, X: Phonation is normal. CN XI: Head turning and shoulder shrug are intact  MOTOR: There is no pronator drift of out-stretched arms. Muscle bulk and tone are normal. Muscle strength is normal.  REFLEXES: Reflexes are 2+ and symmetric at the biceps, triceps, 3/3 knees, and ankles. Plantar responses are flexor.  SENSORY: Intact to light touch, pinprick and vibratory sensation are intact in fingers and toes.  COORDINATION: There is no trunk or limb dysmetria noted.  GAIT/STANCE: Posture is normal. Gait is steady with normal steps, base, arm swing, and turning. Heel and toe walking are normal. Tandem gait is normal.  Romberg is absent.  REVIEW OF SYSTEMS:  Full 14 system review of systems performed and notable only for as above All other review of systems were negative.   ALLERGIES: No Known Allergies  HOME MEDICATIONS: Current  Outpatient Medications  Medication Sig Dispense Refill  . diclofenac sodium (VOLTAREN) 1 % GEL diclofenac 1 % topical gel  APPLY 2 GRAM TO THE AFFECTED AREA(S) BY TOPICAL ROUTE 4 TIMES PER DAY    . famotidine (PEPCID) 20 MG tablet Take 20-40 mg by mouth daily.     . fluticasone (FLONASE) 50 MCG/ACT nasal spray Place 1 spray into both nostrils daily.    . Loratadine (CLARITIN) 10 MG CAPS Take 10 mg by mouth daily.    [name] LOVASTATIN PO Take 5 mg by mouth daily.    . naproxen sodium (ALEVE) 220 MG tablet Take 220 mg by mouth as needed.    . ondansetron (ZOFRAN ODT) 4 MG disintegrating tablet Take 1 tablet (4 mg total) by mouth every 8 (eight) hours as needed. 20 tablet 6  . phenylephrine (SUDAFED PE) 10 MG TABS tablet Take 10 mg by mouth as needed.    . SUMAtriptan (IMITREX) 25 MG tablet Take 1 tablet (25 mg total) by mouth every 2 (two) hours as needed for migraine. May repeat in 2 hours if headache persists or recurs. 10 tablet 6  . tiZANidine (ZANAFLEX) 2 MG tablet Take 1 tablet (2 mg total) by mouth every 8 (eight) hours as needed. 30 tablet 3   No current facility-administered medications for this visit.    PAST MEDICAL HISTORY: Past Medical History:  Diagnosis Date  . DDD (degenerative disc disease), lumbar   . GAD (generalized anxiety disorder)   . Headache   .  Hypercholesterolemia   . Hyperglycemia   . Hyperlipemia   . Lumbar radiculopathy   . Migraine   . Osteoarthritis   . Paresthesia of foot    right  . Sacroiliac joint pain   . Sciatica of right side   . Scoliosis   . Seasonal allergies     PAST SURGICAL HISTORY: Past Surgical History:  Procedure Laterality Date  . OVARIAN CYST REMOVAL Left 1977    FAMILY HISTORY: Family History  Problem Relation Age of Onset  . Stroke Mother        age 33  . COPD Mother   . Other Father        age 74 - complications from surgery  . Hyperlipidemia Brother   . Heart attack Brother        age 82    SOCIAL HISTORY: Social  History   Socioeconomic History  . Marital status: Married    Spouse name: Not on file  . Number of children: 0  . Years of education: 60  . Highest education level: Associate degree: occupational, Hotel manager, or vocational program  Occupational History  . Occupation: Retired  Tobacco Use  . Smoking status: Former Smoker    Quit date: 03/03/1979    Years since quitting: 41.8  . Smokeless tobacco: Never Used  Vaping Use  . Vaping Use: Never used  Substance and Sexual Activity  . Alcohol use: Yes    Comment: social  . Drug use: Never  . Sexual activity: Not on file  Other Topics Concern  . Not on file  Social History Narrative   Lives at home with husband.   Right-handed.   One cup caffeine daily.   Social Determinants of Health   Financial Resource Strain: Not on file  Food Insecurity: Not on file  Transportation Needs: Not on file  Physical Activity: Not on file  Stress: Not on file  Social Connections: Not on file  Intimate Partner Violence: Not on file      Marcial Pacas, M.D. Ph.D.  [facility] [address], [place], [place] [address] Ph: [phone] Fax: [phone]  CC:  [name], MD [place],  [place] [address]  [name], MD

## 2021-01-11 NOTE — Telephone Encounter (Signed)
error 

## 2021-02-27 DIAGNOSIS — H26493 Other secondary cataract, bilateral: Secondary | ICD-10-CM | POA: Diagnosis not present

## 2021-02-27 DIAGNOSIS — H16223 Keratoconjunctivitis sicca, not specified as Sjogren's, bilateral: Secondary | ICD-10-CM | POA: Diagnosis not present

## 2021-02-27 DIAGNOSIS — H04123 Dry eye syndrome of bilateral lacrimal glands: Secondary | ICD-10-CM | POA: Diagnosis not present

## 2021-02-27 DIAGNOSIS — H43813 Vitreous degeneration, bilateral: Secondary | ICD-10-CM | POA: Diagnosis not present

## 2021-02-27 DIAGNOSIS — H43823 Vitreomacular adhesion, bilateral: Secondary | ICD-10-CM | POA: Diagnosis not present

## 2021-04-01 ENCOUNTER — Other Ambulatory Visit: Payer: Self-pay | Admitting: Neurology

## 2021-04-11 DIAGNOSIS — H16223 Keratoconjunctivitis sicca, not specified as Sjogren's, bilateral: Secondary | ICD-10-CM | POA: Diagnosis not present

## 2021-04-11 DIAGNOSIS — H43393 Other vitreous opacities, bilateral: Secondary | ICD-10-CM | POA: Diagnosis not present

## 2021-04-11 DIAGNOSIS — Z961 Presence of intraocular lens: Secondary | ICD-10-CM | POA: Diagnosis not present

## 2021-04-11 DIAGNOSIS — H43813 Vitreous degeneration, bilateral: Secondary | ICD-10-CM | POA: Diagnosis not present

## 2021-04-12 DIAGNOSIS — M19042 Primary osteoarthritis, left hand: Secondary | ICD-10-CM | POA: Diagnosis not present

## 2021-04-12 DIAGNOSIS — M19041 Primary osteoarthritis, right hand: Secondary | ICD-10-CM | POA: Diagnosis not present

## 2021-04-12 DIAGNOSIS — R739 Hyperglycemia, unspecified: Secondary | ICD-10-CM | POA: Diagnosis not present

## 2021-04-12 DIAGNOSIS — R062 Wheezing: Secondary | ICD-10-CM | POA: Diagnosis not present

## 2021-04-12 DIAGNOSIS — J9801 Acute bronchospasm: Secondary | ICD-10-CM | POA: Diagnosis not present

## 2021-04-12 DIAGNOSIS — M816 Localized osteoporosis [Lequesne]: Secondary | ICD-10-CM | POA: Diagnosis not present

## 2021-04-12 DIAGNOSIS — M419 Scoliosis, unspecified: Secondary | ICD-10-CM | POA: Diagnosis not present

## 2021-04-12 DIAGNOSIS — E7800 Pure hypercholesterolemia, unspecified: Secondary | ICD-10-CM | POA: Diagnosis not present

## 2021-04-12 DIAGNOSIS — F4320 Adjustment disorder, unspecified: Secondary | ICD-10-CM | POA: Diagnosis not present

## 2021-04-12 DIAGNOSIS — Z1589 Genetic susceptibility to other disease: Secondary | ICD-10-CM | POA: Diagnosis not present

## 2021-04-12 DIAGNOSIS — R0981 Nasal congestion: Secondary | ICD-10-CM | POA: Diagnosis not present

## 2021-04-12 DIAGNOSIS — N958 Other specified menopausal and perimenopausal disorders: Secondary | ICD-10-CM | POA: Diagnosis not present

## 2021-04-12 DIAGNOSIS — R059 Cough, unspecified: Secondary | ICD-10-CM | POA: Diagnosis not present

## 2021-04-12 DIAGNOSIS — F411 Generalized anxiety disorder: Secondary | ICD-10-CM | POA: Diagnosis not present

## 2021-04-17 DIAGNOSIS — L853 Xerosis cutis: Secondary | ICD-10-CM | POA: Diagnosis not present

## 2021-04-17 DIAGNOSIS — E7800 Pure hypercholesterolemia, unspecified: Secondary | ICD-10-CM | POA: Diagnosis not present

## 2021-04-17 DIAGNOSIS — M19041 Primary osteoarthritis, right hand: Secondary | ICD-10-CM | POA: Diagnosis not present

## 2021-04-17 DIAGNOSIS — F4320 Adjustment disorder, unspecified: Secondary | ICD-10-CM | POA: Diagnosis not present

## 2021-04-17 DIAGNOSIS — M419 Scoliosis, unspecified: Secondary | ICD-10-CM | POA: Diagnosis not present

## 2021-04-17 DIAGNOSIS — Z1589 Genetic susceptibility to other disease: Secondary | ICD-10-CM | POA: Diagnosis not present

## 2021-04-17 DIAGNOSIS — R739 Hyperglycemia, unspecified: Secondary | ICD-10-CM | POA: Diagnosis not present

## 2021-04-17 DIAGNOSIS — M19042 Primary osteoarthritis, left hand: Secondary | ICD-10-CM | POA: Diagnosis not present

## 2021-04-23 DIAGNOSIS — Z23 Encounter for immunization: Secondary | ICD-10-CM | POA: Diagnosis not present

## 2021-06-05 DIAGNOSIS — Z85828 Personal history of other malignant neoplasm of skin: Secondary | ICD-10-CM | POA: Diagnosis not present

## 2021-06-05 DIAGNOSIS — L821 Other seborrheic keratosis: Secondary | ICD-10-CM | POA: Diagnosis not present

## 2021-06-05 DIAGNOSIS — L578 Other skin changes due to chronic exposure to nonionizing radiation: Secondary | ICD-10-CM | POA: Diagnosis not present

## 2021-06-05 DIAGNOSIS — D3610 Benign neoplasm of peripheral nerves and autonomic nervous system, unspecified: Secondary | ICD-10-CM | POA: Diagnosis not present

## 2021-06-05 DIAGNOSIS — Z23 Encounter for immunization: Secondary | ICD-10-CM | POA: Diagnosis not present

## 2021-06-05 DIAGNOSIS — D225 Melanocytic nevi of trunk: Secondary | ICD-10-CM | POA: Diagnosis not present

## 2021-06-25 DIAGNOSIS — U071 COVID-19: Secondary | ICD-10-CM | POA: Diagnosis not present

## 2021-06-26 DIAGNOSIS — Z20822 Contact with and (suspected) exposure to covid-19: Secondary | ICD-10-CM | POA: Diagnosis not present

## 2021-06-28 DIAGNOSIS — M810 Age-related osteoporosis without current pathological fracture: Secondary | ICD-10-CM | POA: Diagnosis not present

## 2021-06-28 DIAGNOSIS — Z6820 Body mass index (BMI) 20.0-20.9, adult: Secondary | ICD-10-CM | POA: Diagnosis not present

## 2021-06-28 DIAGNOSIS — Z124 Encounter for screening for malignant neoplasm of cervix: Secondary | ICD-10-CM | POA: Diagnosis not present

## 2021-07-24 DIAGNOSIS — Z20822 Contact with and (suspected) exposure to covid-19: Secondary | ICD-10-CM | POA: Diagnosis not present

## 2021-08-21 DIAGNOSIS — G43719 Chronic migraine without aura, intractable, without status migrainosus: Secondary | ICD-10-CM | POA: Diagnosis not present

## 2021-08-21 DIAGNOSIS — Z79899 Other long term (current) drug therapy: Secondary | ICD-10-CM | POA: Diagnosis not present

## 2021-08-21 DIAGNOSIS — G43119 Migraine with aura, intractable, without status migrainosus: Secondary | ICD-10-CM | POA: Diagnosis not present

## 2021-08-21 DIAGNOSIS — Z049 Encounter for examination and observation for unspecified reason: Secondary | ICD-10-CM | POA: Diagnosis not present

## 2021-08-28 DIAGNOSIS — I83893 Varicose veins of bilateral lower extremities with other complications: Secondary | ICD-10-CM | POA: Diagnosis not present

## 2021-08-28 DIAGNOSIS — I87301 Chronic venous hypertension (idiopathic) without complications of right lower extremity: Secondary | ICD-10-CM | POA: Diagnosis not present

## 2021-08-28 DIAGNOSIS — M7989 Other specified soft tissue disorders: Secondary | ICD-10-CM | POA: Diagnosis not present

## 2021-08-28 DIAGNOSIS — I87322 Chronic venous hypertension (idiopathic) with inflammation of left lower extremity: Secondary | ICD-10-CM | POA: Diagnosis not present

## 2021-09-12 DIAGNOSIS — I83892 Varicose veins of left lower extremities with other complications: Secondary | ICD-10-CM | POA: Diagnosis not present

## 2021-09-13 ENCOUNTER — Ambulatory Visit: Payer: Medicare Other | Admitting: Neurology

## 2021-09-14 DIAGNOSIS — I83812 Varicose veins of left lower extremities with pain: Secondary | ICD-10-CM | POA: Diagnosis not present

## 2021-09-14 DIAGNOSIS — Z09 Encounter for follow-up examination after completed treatment for conditions other than malignant neoplasm: Secondary | ICD-10-CM | POA: Diagnosis not present

## 2021-09-25 DIAGNOSIS — I83812 Varicose veins of left lower extremities with pain: Secondary | ICD-10-CM | POA: Diagnosis not present

## 2021-09-25 DIAGNOSIS — I83892 Varicose veins of left lower extremities with other complications: Secondary | ICD-10-CM | POA: Diagnosis not present

## 2021-10-11 DIAGNOSIS — M9906 Segmental and somatic dysfunction of lower extremity: Secondary | ICD-10-CM | POA: Diagnosis not present

## 2021-10-11 DIAGNOSIS — M545 Low back pain, unspecified: Secondary | ICD-10-CM | POA: Diagnosis not present

## 2021-10-11 DIAGNOSIS — M9902 Segmental and somatic dysfunction of thoracic region: Secondary | ICD-10-CM | POA: Diagnosis not present

## 2021-10-11 DIAGNOSIS — M9905 Segmental and somatic dysfunction of pelvic region: Secondary | ICD-10-CM | POA: Diagnosis not present

## 2021-10-16 DIAGNOSIS — R739 Hyperglycemia, unspecified: Secondary | ICD-10-CM | POA: Diagnosis not present

## 2021-10-16 DIAGNOSIS — E7800 Pure hypercholesterolemia, unspecified: Secondary | ICD-10-CM | POA: Diagnosis not present

## 2021-10-19 DIAGNOSIS — M9906 Segmental and somatic dysfunction of lower extremity: Secondary | ICD-10-CM | POA: Diagnosis not present

## 2021-10-19 DIAGNOSIS — M545 Low back pain, unspecified: Secondary | ICD-10-CM | POA: Diagnosis not present

## 2021-10-19 DIAGNOSIS — M25551 Pain in right hip: Secondary | ICD-10-CM | POA: Diagnosis not present

## 2021-10-19 DIAGNOSIS — M9905 Segmental and somatic dysfunction of pelvic region: Secondary | ICD-10-CM | POA: Diagnosis not present

## 2021-10-19 DIAGNOSIS — M9903 Segmental and somatic dysfunction of lumbar region: Secondary | ICD-10-CM | POA: Diagnosis not present

## 2021-10-19 DIAGNOSIS — M9904 Segmental and somatic dysfunction of sacral region: Secondary | ICD-10-CM | POA: Diagnosis not present

## 2021-10-24 DIAGNOSIS — M810 Age-related osteoporosis without current pathological fracture: Secondary | ICD-10-CM | POA: Diagnosis not present

## 2021-10-31 ENCOUNTER — Ambulatory Visit
Admission: RE | Admit: 2021-10-31 | Discharge: 2021-10-31 | Disposition: A | Discharge status: Home / Self Care | Payer: Medicare Other | Source: Ambulatory Visit | Attending: Sports Medicine | Admitting: Sports Medicine

## 2021-10-31 ENCOUNTER — Other Ambulatory Visit: Payer: Self-pay | Admitting: Sports Medicine

## 2021-10-31 DIAGNOSIS — M533 Sacrococcygeal disorders, not elsewhere classified: Secondary | ICD-10-CM | POA: Diagnosis not present

## 2021-10-31 DIAGNOSIS — S3210XA Unspecified fracture of sacrum, initial encounter for closed fracture: Secondary | ICD-10-CM | POA: Diagnosis not present

## 2021-10-31 DIAGNOSIS — M25552 Pain in left hip: Secondary | ICD-10-CM | POA: Diagnosis not present

## 2021-10-31 DIAGNOSIS — R52 Pain, unspecified: Secondary | ICD-10-CM

## 2021-10-31 DIAGNOSIS — M546 Pain in thoracic spine: Secondary | ICD-10-CM

## 2021-10-31 DIAGNOSIS — S32511A Fracture of superior rim of right pubis, initial encounter for closed fracture: Secondary | ICD-10-CM | POA: Diagnosis not present

## 2021-10-31 DIAGNOSIS — M545 Low back pain, unspecified: Secondary | ICD-10-CM | POA: Diagnosis not present

## 2021-10-31 MED ORDER — IOPAMIDOL (ISOVUE-M 200) INJECTION 41%: 1.0000 mL | Freq: Once | INTRAMUSCULAR | Status: DC

## 2021-10-31 MED ORDER — METHYLPREDNISOLONE ACETATE 40 MG/ML INJ SUSP (RADIOLOG: 80.0000 mg | Freq: Once | INTRAMUSCULAR | Status: DC

## 2021-11-02 DIAGNOSIS — M25551 Pain in right hip: Secondary | ICD-10-CM | POA: Diagnosis not present

## 2021-11-02 DIAGNOSIS — M9906 Segmental and somatic dysfunction of lower extremity: Secondary | ICD-10-CM | POA: Diagnosis not present

## 2021-11-02 DIAGNOSIS — M9905 Segmental and somatic dysfunction of pelvic region: Secondary | ICD-10-CM | POA: Diagnosis not present

## 2021-11-02 DIAGNOSIS — M533 Sacrococcygeal disorders, not elsewhere classified: Secondary | ICD-10-CM | POA: Diagnosis not present

## 2021-11-02 DIAGNOSIS — M9903 Segmental and somatic dysfunction of lumbar region: Secondary | ICD-10-CM | POA: Diagnosis not present

## 2021-11-08 DIAGNOSIS — E7800 Pure hypercholesterolemia, unspecified: Secondary | ICD-10-CM | POA: Diagnosis not present

## 2021-11-08 DIAGNOSIS — M419 Scoliosis, unspecified: Secondary | ICD-10-CM | POA: Diagnosis not present

## 2021-11-08 DIAGNOSIS — M19042 Primary osteoarthritis, left hand: Secondary | ICD-10-CM | POA: Diagnosis not present

## 2021-11-08 DIAGNOSIS — F4320 Adjustment disorder, unspecified: Secondary | ICD-10-CM | POA: Diagnosis not present

## 2021-11-08 DIAGNOSIS — Z1231 Encounter for screening mammogram for malignant neoplasm of breast: Secondary | ICD-10-CM | POA: Diagnosis not present

## 2021-11-08 DIAGNOSIS — M62838 Other muscle spasm: Secondary | ICD-10-CM | POA: Diagnosis not present

## 2021-11-08 DIAGNOSIS — F411 Generalized anxiety disorder: Secondary | ICD-10-CM | POA: Diagnosis not present

## 2021-11-08 DIAGNOSIS — Z1589 Genetic susceptibility to other disease: Secondary | ICD-10-CM | POA: Diagnosis not present

## 2021-11-08 DIAGNOSIS — M19041 Primary osteoarthritis, right hand: Secondary | ICD-10-CM | POA: Diagnosis not present

## 2021-11-08 DIAGNOSIS — Z6820 Body mass index (BMI) 20.0-20.9, adult: Secondary | ICD-10-CM | POA: Diagnosis not present

## 2021-11-08 DIAGNOSIS — R739 Hyperglycemia, unspecified: Secondary | ICD-10-CM | POA: Diagnosis not present

## 2021-11-16 DIAGNOSIS — M9904 Segmental and somatic dysfunction of sacral region: Secondary | ICD-10-CM | POA: Diagnosis not present

## 2021-11-16 DIAGNOSIS — M25512 Pain in left shoulder: Secondary | ICD-10-CM | POA: Diagnosis not present

## 2021-11-16 DIAGNOSIS — M9905 Segmental and somatic dysfunction of pelvic region: Secondary | ICD-10-CM | POA: Diagnosis not present

## 2021-11-16 DIAGNOSIS — M9908 Segmental and somatic dysfunction of rib cage: Secondary | ICD-10-CM | POA: Diagnosis not present

## 2021-11-16 DIAGNOSIS — M542 Cervicalgia: Secondary | ICD-10-CM | POA: Diagnosis not present

## 2021-11-16 DIAGNOSIS — M9902 Segmental and somatic dysfunction of thoracic region: Secondary | ICD-10-CM | POA: Diagnosis not present

## 2021-11-16 DIAGNOSIS — M545 Low back pain, unspecified: Secondary | ICD-10-CM | POA: Diagnosis not present

## 2021-11-16 DIAGNOSIS — M9906 Segmental and somatic dysfunction of lower extremity: Secondary | ICD-10-CM | POA: Diagnosis not present

## 2021-11-16 DIAGNOSIS — M9901 Segmental and somatic dysfunction of cervical region: Secondary | ICD-10-CM | POA: Diagnosis not present

## 2021-11-21 ENCOUNTER — Other Ambulatory Visit: Payer: Self-pay | Admitting: Sports Medicine

## 2021-11-21 DIAGNOSIS — M25551 Pain in right hip: Secondary | ICD-10-CM

## 2021-11-21 DIAGNOSIS — M545 Low back pain, unspecified: Secondary | ICD-10-CM

## 2021-11-21 DIAGNOSIS — M533 Sacrococcygeal disorders, not elsewhere classified: Secondary | ICD-10-CM

## 2021-11-28 ENCOUNTER — Other Ambulatory Visit: Payer: Self-pay | Admitting: Sports Medicine

## 2021-11-28 DIAGNOSIS — M25551 Pain in right hip: Secondary | ICD-10-CM

## 2021-11-28 DIAGNOSIS — M545 Low back pain, unspecified: Secondary | ICD-10-CM

## 2021-11-28 DIAGNOSIS — M533 Sacrococcygeal disorders, not elsewhere classified: Secondary | ICD-10-CM

## 2021-12-01 ENCOUNTER — Other Ambulatory Visit: Payer: 59

## 2021-12-08 ENCOUNTER — Ambulatory Visit
Admission: RE | Admit: 2021-12-08 | Discharge: 2021-12-08 | Disposition: A | Discharge status: Home / Self Care | Payer: Medicare Other | Source: Ambulatory Visit | Attending: Sports Medicine | Admitting: Sports Medicine

## 2021-12-08 DIAGNOSIS — M5136 Other intervertebral disc degeneration, lumbar region: Secondary | ICD-10-CM | POA: Diagnosis not present

## 2021-12-08 DIAGNOSIS — M545 Low back pain, unspecified: Secondary | ICD-10-CM | POA: Diagnosis not present

## 2021-12-08 DIAGNOSIS — M25551 Pain in right hip: Secondary | ICD-10-CM

## 2021-12-08 DIAGNOSIS — S76312A Strain of muscle, fascia and tendon of the posterior muscle group at thigh level, left thigh, initial encounter: Secondary | ICD-10-CM | POA: Diagnosis not present

## 2021-12-08 DIAGNOSIS — D259 Leiomyoma of uterus, unspecified: Secondary | ICD-10-CM | POA: Diagnosis not present

## 2021-12-08 DIAGNOSIS — M533 Sacrococcygeal disorders, not elsewhere classified: Secondary | ICD-10-CM

## 2021-12-08 DIAGNOSIS — M47816 Spondylosis without myelopathy or radiculopathy, lumbar region: Secondary | ICD-10-CM | POA: Diagnosis not present

## 2021-12-08 DIAGNOSIS — K5730 Diverticulosis of large intestine without perforation or abscess without bleeding: Secondary | ICD-10-CM | POA: Diagnosis not present

## 2021-12-08 DIAGNOSIS — M48061 Spinal stenosis, lumbar region without neurogenic claudication: Secondary | ICD-10-CM | POA: Diagnosis not present

## 2021-12-08 DIAGNOSIS — M4126 Other idiopathic scoliosis, lumbar region: Secondary | ICD-10-CM | POA: Diagnosis not present

## 2021-12-21 DIAGNOSIS — M545 Low back pain, unspecified: Secondary | ICD-10-CM | POA: Diagnosis not present

## 2021-12-21 DIAGNOSIS — M533 Sacrococcygeal disorders, not elsewhere classified: Secondary | ICD-10-CM | POA: Diagnosis not present

## 2021-12-25 DIAGNOSIS — I781 Nevus, non-neoplastic: Secondary | ICD-10-CM | POA: Diagnosis not present

## 2021-12-25 DIAGNOSIS — I87392 Chronic venous hypertension (idiopathic) with other complications of left lower extremity: Secondary | ICD-10-CM | POA: Diagnosis not present

## 2021-12-25 DIAGNOSIS — R252 Cramp and spasm: Secondary | ICD-10-CM | POA: Diagnosis not present

## 2021-12-28 ENCOUNTER — Telehealth: Payer: Self-pay | Admitting: Physical Medicine and Rehabilitation

## 2021-12-28 NOTE — Telephone Encounter (Signed)
Patient has a referral to see Dr. Ernestina Patches and she is wanting to get an appointment scheduled. Please advise

## 2021-12-31 ENCOUNTER — Encounter: Payer: Self-pay | Admitting: Physical Medicine and Rehabilitation

## 2021-12-31 ENCOUNTER — Ambulatory Visit (INDEPENDENT_AMBULATORY_CARE_PROVIDER_SITE_OTHER): Payer: Medicare Other | Admitting: Physical Medicine and Rehabilitation

## 2021-12-31 DIAGNOSIS — M4306 Spondylolysis, lumbar region: Secondary | ICD-10-CM

## 2021-12-31 DIAGNOSIS — M4726 Other spondylosis with radiculopathy, lumbar region: Secondary | ICD-10-CM | POA: Diagnosis not present

## 2021-12-31 DIAGNOSIS — M47816 Spondylosis without myelopathy or radiculopathy, lumbar region: Secondary | ICD-10-CM | POA: Diagnosis not present

## 2021-12-31 DIAGNOSIS — M5416 Radiculopathy, lumbar region: Secondary | ICD-10-CM

## 2021-12-31 NOTE — Progress Notes (Unsigned)
Pt state lower back pain that travels down both leg. Pt state laying down and getting out of bed in the morning makes the pain worse. Pt state she has pain in her neck that travels to both shoulders. Pt state she takes pain meds and uses heat to help ease her pain.  Numeric Pain Rating Scale and Functional Assessment Average Pain 10 Pain Right Now 5 My pain is intermittent, burning, dull, stabbing, tingling, and aching Pain is worse with: some activites and laying down Pain improves with: heat/ice, medication, and injections   In the last MONTH (on 0-10 scale) has pain interfered with the following?  1. General activity like being  able to carry out your everyday physical activities such as walking, climbing stairs, carrying groceries, or moving a chair?  Rating(5)  2. Relation with others like being able to carry out your usual social activities and roles such as  activities at home, at work and in your community. Rating(6)  3. Enjoyment of life such that you have  been bothered by emotional problems such as feeling anxious, depressed or irritable?  Rating(7)

## 2021-12-31 NOTE — Progress Notes (Unsigned)
Tina Murray - 71 y.o. female MRN [id number]  Date of birth: Jan 18, 1951  Office Visit Note: Visit Date: 12/31/2021 PCP: [name], MD Referred by: [name], MD  Subjective: Chief Complaint  Patient presents with   Lower Back - Pain   Right Leg - Pain   Left Leg - Pain   Neck - Pain   Right Shoulder - Pain   Left Shoulder - Pain   HPI: Tina Murray is a 71 y.o. female who comes in today At the request of Dr. Teresa Coombs for evaluation of chronic, worsening and severe bilateral lower back pain radiating to buttocks, also states intermittent radiation of pain to both lateral thighs down to knees and reports tingling sensation to plantar aspect of right foot. Patient reports pain has been ongoing for several years, worsened over the last 3 months. Patient reports fall on black ice in 2015 while living in [place] where she sustained right pubic rami fracture. Patient reports fall increased overall pain. Patient reports pain is exacerbated by sitting and is typically more severe in the mornings. She describes her pain as a sore/aching sensation, also reports tingling/numbness to plantar aspect of right foot, currently rates pain as 7 out of 10. Patient reports some relief of pain with home exercise regimen, rest and use of medications. Patient currently taking Celebrex as needed. Patient states she has attended formal physical therapy in the past and reports no relief of pain, however states her pain increased with these treatments. Patients recent lumbar MRI exhibits mild levoconvex upper lumbar scoliosis and dextroconvex lower lumbar scoliosis, there is 4 mm anterolisthesis with suspicion for a chronic left L5 pars defect at the level of L5-S1. There is also multi-level facet hypertrophy, most advanced on the left at L4-L5. No high grade spinal canal stenosis noted. Patient was previously treated by Dr. Suella Broad at [facility] where she had multiple lumbar epidural steroid  injections and possible sacroiliac joint injections. We are not able to review his office/procedure notes at this time. Patient states she received good pain relief for over a year from last injection performed by Dr. Nelva Bush. Patient was recently seen by Dr. Teresa Coombs and reports significant relief of pain with manipulation treatments. Dr Paulla Fore referred her to [place] for possible radiofrequency ablation. Patient denies focal weakness. Patient denies recent trauma or falls.   Review of Systems  Musculoskeletal:  Positive for back pain.  Neurological:  Positive for tingling. Negative for focal weakness and weakness.  All other systems reviewed and are negative. Otherwise per HPI.  Assessment & Plan: Visit Diagnoses:    ICD-10-CM   1. Lumbar radiculopathy  M54.16     2. Other spondylosis with radiculopathy, lumbar region  M47.26     3. Facet hypertrophy of lumbar region  M47.816     4. Pars defect of lumbar spine  M43.06        Plan: Findings:  Chronic, worsening and severe bilateral lower back pain radiating to buttocks, intermittent radiation of pain to both lateral thighs down to knees and tingling sensation to plantar aspect of right foot. Patient continues to have severe pain despite good conservative therapies such as formal physical therapy, home exercise regimen, rest and use of medications. Patients clinical presentation and exam are complex, differentials could include lumbar radiculopathy vs facet mediated pain. We would like to review patients records from Dr. Nelva Bush office before proceeding with procedure as she states last injection provided her with significant pain relief for  over a year. I will assist patient in filling out medical release form today. We did talk with her about possibility of lumbar epidural steroid injection vs facet blocks/radiofrequency ablation. Patient voiced concerns about repeating lumbar epidural steroid injection due to osteoporosis. We did explain to her  that repeating these injections infrequently is safe and does not lead to long term complications. Patient encouraged to remain active and to continue home exercise regimen as tolerated. We will follow up with patient after reviewing Dr. Nelva Bush notes to discuss plan of care. No red flag symptoms noted upon exam today.    Meds & Orders: No orders of the defined types were placed in this encounter.  No orders of the defined types were placed in this encounter.   Follow-up: Return for will follow up with patient after reviewing notes from Dr. Nelva Bush office.   Procedures: No procedures performed      Clinical History: EXAM: MRI LUMBAR SPINE WITHOUT CONTRAST   TECHNIQUE: Multiplanar, multisequence MR imaging of the lumbar spine was performed. No intravenous contrast was administered.   COMPARISON:  MR lumbar 05/08/2017; X-ray lumbar 10/31/2021.   FINDINGS: Segmentation: The lowest lumbar type non-rib-bearing vertebra is labeled as L5.   Alignment: Mild levoconvex upper lumbar scoliosis dextroconvex lower lumbar scoliosis. 4 mm anterolisthesis at the L5-S1 level with suspicion for a chronic left L5 pars defect. 3 mm degenerative retrolisthesis at L2-3.   Vertebrae: Disc desiccation at all levels between L2-S1 with loss of disc height especially at L2-3 and L4-5. Degenerative endplate findings are noted especially at L2-3 and L4-5, and are increased from prior particularly at L4-5. Facet and perifacet edema on the left at L3-4 and L4-5, and on the right at L5-S1. Suspected chronic deformity at the upper L3 vertebral level likely from an old healed fracture.   Conus medullaris and cauda equina: Conus extends to the L1-2 level. Conus and cauda equina appear normal.   Paraspinal and other soft tissues: T2 hyperintense lesion posteriorly in the right hepatic lobe on image 5 series 14, stable from 2018. 1.1 by 0.9 cm T2 hyperintense lesion centrally in the right hepatic lobe on image 2  series 14, nonspecific although statistically likely to be benign. Mild chronic fullness of the left renal collecting system and left proximal ureter, mild fluid signal intensity fullness of both collecting systems and proximal ureters, significance uncertain.   Disc levels:   T12-L1: No impingement.  Small central disc protrusion.   L1-2: No impingement.  Mild disc bulge.   L2-3: Mild displacement of the right L2 nerve in the lateral extraforaminal space due to disc osteophyte complex, similar to prior.   L3-4: Mild left foraminal stenosis with mild left and borderline right subarticular lateral recess stenosis and borderline central narrowing of the thecal sac due to disc bulge, intervertebral spurring, and facet arthropathy. Trace right facet joint effusion. Finding similar to prior.   L4-5: Moderate left foraminal stenosis with mild bilateral subarticular lateral recess stenosis and borderline central narrowing of the thecal sac due to disc bulge, intervertebral spurring, and left greater than right facet arthropathy. The left foraminal impingement is mildly worsened from prior. Small synovial cyst posterior to the left facet joint.   L5-S1: Moderate right and borderline left foraminal stenosis with borderline left subarticular lateral recess stenosis due to disc uncovering, disc bulge, and facet arthropathy. Left foraminal findings are mildly improved compared to prior.   IMPRESSION: 1. Lumbar spondylosis and degenerative disc disease, causing moderate impingement at L4-5 and  L5-S1, and mild impingement at L2-3 and L3-4, as detailed above. The most part this is similar to the prior exam, although the left foraminal impingement is mildly worsened at L4-5 and mildly improved at L5-S1 compared to 05/08/2017. 2. Two T2 hyperintense lesions of the right hepatic lobe are noted, one stable from 2018 and the other potentially obscured on the prior exam due to a saturation  band. These are technically nonspecific although statistically likely to be benign. 3. Lumbar scoliosis. 4. Potential chronic left L5 pars defect with 4 mm anterolisthesis at L5-S1. 5. Multilevel substantial degenerative facet arthropathy with associated facet edema.     Electronically Signed   By: Van Clines M.D.   On: 12/10/2021 11:18   She reports that she quit smoking about 42 years ago. She has never used smokeless tobacco. No results for input(s): HGBA1C, LABURIC in the last 8760 hours.  Objective:  VS:  HT:    WT:   BMI:     BP:   HR: bpm  TEMP: ( )  RESP:  Physical Exam Vitals and nursing note reviewed.  HENT:     Head: Normocephalic and atraumatic.     Right Ear: External ear normal.     Left Ear: External ear normal.     Nose: Nose normal.     Mouth/Throat:     Mouth: Mucous membranes are moist.  Eyes:     Extraocular Movements: Extraocular movements intact.  Cardiovascular:     Rate and Rhythm: Normal rate.     Pulses: Normal pulses.  Pulmonary:     Effort: Pulmonary effort is normal.  Abdominal:     General: Abdomen is flat. There is no distension.  Musculoskeletal:        General: Tenderness present.     Cervical back: Normal range of motion.     Comments: Pt rises from seated position to standing without difficulty. Good lumbar range of motion. Strong distal strength without clonus, no pain upon palpation of greater trochanters. Dysesthesias noted to bilateral L5 dermatomes. Sensation intact bilaterally. Walks independently, gait steady.  Skin:    General: Skin is warm and dry.     Capillary Refill: Capillary refill takes less than 2 seconds.  Neurological:     General: No focal deficit present.     Mental Status: She is alert and oriented to person, place, and time.  Psychiatric:        Mood and Affect: Mood normal.        Behavior: Behavior normal.    Ortho Exam  Imaging: No results found.  Past Medical/Family/Surgical/Social  History: Medications & Allergies reviewed per EMR, new medications updated. Patient Active Problem List   Diagnosis Date Noted   Chronic migraine w/o aura w/o status migrainosus, not intractable 01/10/2021   Anxiety 01/10/2021   Chronic migraine 03/03/2020   Chronic neck pain 03/03/2020   Paresthesia 11/24/2017   Neck pain 11/24/2017   Past Medical History:  Diagnosis Date   DDD (degenerative disc disease), lumbar    GAD (generalized anxiety disorder)    Headache    Hypercholesterolemia    Hyperglycemia    Hyperlipemia    Lumbar radiculopathy    Migraine    Osteoarthritis    Paresthesia of foot    right   Sacroiliac joint pain    Sciatica of right side    Scoliosis    Seasonal allergies    Family History  Problem Relation Age of Onset   Stroke Mother  age 24   COPD Mother    Other Father        age 14 - complications from surgery   Hyperlipidemia Brother    Heart attack Brother        age 65   Past Surgical History:  Procedure Laterality Date   OVARIAN CYST REMOVAL Left 1977   Social History   Occupational History   Occupation: Retired  Tobacco Use   Smoking status: Former    Types: Cigarettes    Quit date: 03/03/1979    Years since quitting: 42.8   Smokeless tobacco: Never  Vaping Use   Vaping Use: Never used  Substance and Sexual Activity   Alcohol use: Yes    Comment: social   Drug use: Never   Sexual activity: Not on file

## 2022-01-23 ENCOUNTER — Encounter: Payer: Self-pay | Admitting: Neurology

## 2022-01-23 ENCOUNTER — Ambulatory Visit (INDEPENDENT_AMBULATORY_CARE_PROVIDER_SITE_OTHER): Payer: Medicare Other | Admitting: Neurology

## 2022-01-23 VITALS — BP 134/83 | HR 54 | Ht 65.0 in | Wt 120.0 lb

## 2022-01-23 DIAGNOSIS — M542 Cervicalgia: Secondary | ICD-10-CM | POA: Diagnosis not present

## 2022-01-23 DIAGNOSIS — G8929 Other chronic pain: Secondary | ICD-10-CM | POA: Diagnosis not present

## 2022-01-23 DIAGNOSIS — F419 Anxiety disorder, unspecified: Secondary | ICD-10-CM | POA: Diagnosis not present

## 2022-01-23 DIAGNOSIS — G43709 Chronic migraine without aura, not intractable, without status migrainosus: Secondary | ICD-10-CM

## 2022-01-23 MED ORDER — UBRELVY 50 MG PO TABS
ORAL_TABLET | ORAL | 6 refills | Status: DC
Start: 2022-01-23 — End: 2022-02-18

## 2022-01-23 NOTE — Progress Notes (Signed)
Chief Complaint  Patient presents with   Follow-up    Room 14, alone  right foot numbness(saw Dr Krista Blue for this in 11-2017)        ASSESSMENT AND PLAN  Tina Murray is a 71 y.o. female   Chronic migraine Worsening anxiety Chronic low back pain neck pain, known history of lumbar and cervical spondylosis and degenerative disease Right foot discomfort  No evidence of peripheral neuropathy on examination  EMG nerve conduction study in May 2019 showed no evidence of large fiber peripheral neuropathy  Her right foot discomfort are most likely due to right hallucis valgus, will benefit podiatrist evaluation  Hyperreflexia on examination, likely related to her cervical degenerative changes, she functioning well, will not pursue further evaluation  Ubrelvy as needed for chronic migraine    DIAGNOSTIC DATA (LABS, IMAGING, TESTING) - I reviewed patient records, labs, notes, testing and imaging myself where available.   HISTORICAL  Tina Murray is a 71 year old female, seen in request by her primary care physician Dr. Jacelyn Grip, [place] for evaluation of chronic migraine headaches,  I reviewed and summarized the referring note.  Past medical history anxiety Hyperlipidemia  She reported long history of chronic migraine headaches, especially perimenopause period of time, was treated at [facility], but she forgot the medication  Her headache overall has much improved after menopause, but around 2020, she began to have frequent headaches again, now 1-3 times each week, sometimes she woke up with holoacranial pressure headache, was pounding, light noise sensitivity, occasionally nausea, she has tried over-the-counter medication Advil with limited help, her headache can last for hours or whole day, and sometimes interrupt her schedule  She continue complains of neck, low back pain, we personally reviewed MRI cervical spine May 2019, multilevel degenerative changes, most  noticeable at C4-5, degenerative changes causing moderate severe left foraminal narrowing, C5-6, retrolisthesis, degenerative changes cause moderate spinal stenosis,   I saw her previously in April 2019 for evaluation of muscle weakness, low back pain, after she fell on ice,  MRI of lumbar showed multilevel degenerative disc disease, with moderate foraminal stenosis bilaterally L5-S1, facet hypertrophy, vertebral spurring,  EMG nerve conduction study in May 2019 showed no significant abnormality, in specific, there is no evidence of large fiber peripheral neuropathy, right cervical radiculopathy.  She continue complains of significant arthritic pain, recently had finger surgery which has helped her trigger finger and finger pain  Update June/08/2020 She accompanied by her husband at today's visit, complains of significant anxiety with her husband recent diagnosis of left basal ganglion structural abnormality, she is taking clonazepam low-dose as needed, long history of migraine headaches, now complains of bilateral shoulder pain, often radiating up to become mild to moderate headaches  Yesterday Jan 09, 2021, she also complains of 1 episode of seeing flashing light in her visual field lasting for few minutes, followed by moderate holoacranial headaches, Tylenol only provide limited help  Previous evaluation I have given her prescription of Imitrex 25 mg, she tried it, was not sure about the benefit, prefer not to take too much medications,  Many years ago has tried SSRI treatment for a long history of anxiety, does not want to try it again  We personally reviewed MRI of the cervical spine Dec 12, 2017: Multilevel degenerative changes, most obvious at C4-5, C5-6, C6-7, evidence of moderate canal stenosis at C5-6, with moderate bilateral foraminal narrowing  MRI of lumbar spine multilevel mild degenerative changes, no significant canal stenosis, variable degree of foraminal  narrowing, most obvious  at left L4-5, and bilateral L5-S1  UPDATE January 23 2022: She complains of right foot discomfort mainly at the ball of her right foot, she had a history of bilateral bunionectomy in the past, left side continues to do well, but over the past few years, she developed right first toe valgus deformity again, mild tenderness bearing weight,  She had long history of chronic lumbar and cervical degenerative disease, is under the management of orthopedic surgeon, she has occasionally radiating pain from back to right hip, to right lower extremity  She denies significant gait abnormality no bowel or bladder incontinence  In addition, she has intermittent numbness tingling of bilateral foot, more on the right side, worried she might have a neuropathy  We personally reviewed MRI of lumbar spine 12/08/2021, spondylosis and degenerative disc disease, variable degree of foraminal narrowing at L2-S1 level, there was no significant canal stenosis, potential left L5 pars defect with anterolisthesis of L5 and S1  MRI cervical spine 2019: Multilevel degenerative changes, moderate spinal stenosis at C5-6, no cord signal abnormality variable degree of foraminal narrowing  MRI of the pelvis showed old healed fracture of right pubic rami, sacral at the upper S3 level, edema signal surrounding and within the portion of the left obturator internus muscle, no significant hip pathology noticed  Her Migraine is under good control, only happening occasionally, taking Aleve as needed  PHYSICAL EXAM:   Vitals:   01/23/22 1443  BP: 134/83  Pulse: (!) 54  Weight: 120 lb (54.4 kg)  Height: '5\' 5"'$  (1.651 m)   PHYSICAL EXAMNIATION:  Gen: NAD, conversant, well nourised, well groomed                     Cardiovascular: Regular rate rhythm, no peripheral edema, warm, nontender. Eyes: Conjunctivae clear without exudates or hemorrhage Neck: Supple, no carotid bruits. Pulmonary: Clear to auscultation bilaterally    NEUROLOGICAL EXAM:  MENTAL STATUS: Anxious looking middle-aged female Speech/cognition: Awake, alert, oriented to history taking and casual conversation    CRANIAL NERVES: CN II: Visual fields are full to confrontation. Pupils are round equal and briskly reactive to light. CN III, IV, VI: extraocular movement are normal. No ptosis. CN V: Facial sensation is intact to light touch CN VII: Face is symmetric with normal eye closure  CN VIII: Hearing is normal to causal conversation. CN IX, X: Phonation is normal. CN XI: Head turning and shoulder shrug are intact  MOTOR: Normal upper and lower extremity proximal and distal strength, thicked right foot ball pad, increased callus compared to the left side no significant tenderness  REFLEXES: Reflexes are 2+ and symmetric at the biceps, triceps, 3/3 knees, and ankles. Plantar responses are flexor.  SENSORY: Intact to light touch, pinprick and vibratory sensation are intact in fingers and toes.  COORDINATION: There is no trunk or limb dysmetria noted.  GAIT/STANCE: Posture is normal. Gait is steady   REVIEW OF SYSTEMS:  Full 14 system review of systems performed and notable only for as above All other review of systems were negative.   ALLERGIES: No Known Allergies  HOME MEDICATIONS: Current Outpatient Medications  Medication Sig Dispense Refill   celecoxib (CELEBREX) 100 MG capsule 1 capsule     diclofenac sodium (VOLTAREN) 1 % GEL diclofenac 1 % topical gel  APPLY 2 GRAM TO THE AFFECTED AREA(S) BY TOPICAL ROUTE 4 TIMES PER DAY     famotidine (PEPCID) 20 MG tablet Take 20-40 mg by mouth daily.  fluticasone (FLONASE) 50 MCG/ACT nasal spray Place 1 spray into both nostrils daily.     Loratadine 10 MG CAPS Take 10 mg by mouth daily.     LOVASTATIN PO Take 5 mg by mouth daily.     naproxen sodium (ALEVE) 220 MG tablet Take 220 mg by mouth as needed.     ondansetron (ZOFRAN ODT) 4 MG disintegrating tablet Take 1 tablet (4  mg total) by mouth every 8 (eight) hours as needed. 20 tablet 6   phenylephrine (SUDAFED PE) 10 MG TABS tablet Take 10 mg by mouth as needed.     No current facility-administered medications for this visit.    PAST MEDICAL HISTORY: Past Medical History:  Diagnosis Date   DDD (degenerative disc disease), lumbar    GAD (generalized anxiety disorder)    Headache    Hypercholesterolemia    Hyperglycemia    Hyperlipemia    Lumbar radiculopathy    Migraine    Osteoarthritis    Paresthesia of foot    right   Sacroiliac joint pain    Sciatica of right side    Scoliosis    Seasonal allergies     PAST SURGICAL HISTORY: Past Surgical History:  Procedure Laterality Date   OVARIAN CYST REMOVAL Left 1977    FAMILY HISTORY: Family History  Problem Relation Age of Onset   Stroke Mother        age 38   COPD Mother    Other Father        age 16 - complications from surgery   Hyperlipidemia Brother    Heart attack Brother        age 55    SOCIAL HISTORY: Social History   Socioeconomic History   Marital status: Married    Spouse name: Not on file   Number of children: 0   Years of education: 14   Highest education level: Associate degree: occupational, Hotel manager, or vocational program  Occupational History   Occupation: Retired  Tobacco Use   Smoking status: Former    Types: Cigarettes    Quit date: 03/03/1979    Years since quitting: 42.9   Smokeless tobacco: Never  Vaping Use   Vaping Use: Never used  Substance and Sexual Activity   Alcohol use: Yes    Comment: social   Drug use: Never   Sexual activity: Not on file  Other Topics Concern   Not on file  Social History Narrative   Lives at home with husband.   Right-handed.   One cup caffeine daily.   Social Determinants of Health   Financial Resource Strain: Not on file  Food Insecurity: Not on file  Transportation Needs: Not on file  Physical Activity: Not on file  Stress: Not on file  Social  Connections: Not on file  Intimate Partner Violence: Not on file      Marcial Pacas, M.D. Ph.D.  [facility] [address], [place], [place] [address] Ph: [phone] Fax: [phone]  CC:  [name], MD [place],  [place] [address]  [name], MD

## 2022-01-28 ENCOUNTER — Telehealth: Payer: Self-pay

## 2022-01-28 NOTE — Telephone Encounter (Signed)
PA for Roselyn Meier has been sent via cmm.  (Key: [id number])  Your information has been sent to [facility].

## 2022-01-29 NOTE — Telephone Encounter (Signed)
Received denial yesterday on this medication and spoke with Dr. Krista Blue. In the denial received notices that the pt has to try/fail two triptans. Spoke on 01/28/2022 with Dr. Krista Blue and received verbal order to start rizatriptan 5 mg 1 tab PRN for headaches can take additional tab in 2 hours if not improved. No more than 2 in a 24 hour period.   On 01/29/2022 received a notice from [facility] that the ubrelvy had in fact been approved.....  I have sent the notice of the approval to [facility] for the ubrelvy. Hopefully the pt can get this filled.

## 2022-01-31 ENCOUNTER — Telehealth: Payer: Self-pay | Admitting: Physical Medicine and Rehabilitation

## 2022-01-31 NOTE — Telephone Encounter (Signed)
Patient called asked for a call back  from [facility] concerning her visit on 12/31/2021. The number to contact patient is [phone]

## 2022-02-07 ENCOUNTER — Telehealth: Payer: Self-pay | Admitting: Neurology

## 2022-02-07 MED ORDER — CELECOXIB 100 MG PO CAPS: ORAL_CAPSULE | ORAL | 3 refills | Status: DC

## 2022-02-07 NOTE — Telephone Encounter (Signed)
I spoke with the patient. She previously suffered fractures of her sacrum and was prescribed celecoxib 100 MG capsule. Her sports medicine doctor (Dr. Paulla Fore) was the original prescribing physician. Dr. Paulla Fore referred her out to Dr. Ernestina Patches who specializes in radiofrequency. She states she has been unable to obtain an appointment with that office.   As per the patient, Dr. Krista Blue discussed taking over her celecoxib refills during her last visit due to her chronic low back and neck pain and history of lumbar and cervical spondylosis and degenerative disease. Will pend for MD review.

## 2022-02-07 NOTE — Telephone Encounter (Signed)
Pt asking if Dr. Krista Blue can take over refills for celecoxib (CELEBREX) 100 MG capsule. Would like  a call from the nurse.

## 2022-02-07 NOTE — Telephone Encounter (Signed)
Meds ordered this encounter  Medications   celecoxib (CELEBREX) 100 MG capsule    Sig: Twice a day as needed.    Dispense:  60 capsule    Refill:  3

## 2022-02-18 ENCOUNTER — Ambulatory Visit (INDEPENDENT_AMBULATORY_CARE_PROVIDER_SITE_OTHER): Payer: Medicare Other | Admitting: Podiatry

## 2022-02-18 ENCOUNTER — Encounter: Payer: Self-pay | Admitting: Podiatry

## 2022-02-18 ENCOUNTER — Telehealth: Payer: Self-pay | Admitting: Physical Medicine and Rehabilitation

## 2022-02-18 ENCOUNTER — Ambulatory Visit (INDEPENDENT_AMBULATORY_CARE_PROVIDER_SITE_OTHER): Payer: Medicare Other

## 2022-02-18 DIAGNOSIS — M2041 Other hammer toe(s) (acquired), right foot: Secondary | ICD-10-CM

## 2022-02-18 DIAGNOSIS — D3613 Benign neoplasm of peripheral nerves and autonomic nervous system of lower limb, including hip: Secondary | ICD-10-CM | POA: Diagnosis not present

## 2022-02-18 DIAGNOSIS — L84 Corns and callosities: Secondary | ICD-10-CM | POA: Diagnosis not present

## 2022-02-18 DIAGNOSIS — M21612 Bunion of left foot: Secondary | ICD-10-CM | POA: Diagnosis not present

## 2022-02-18 DIAGNOSIS — M21611 Bunion of right foot: Secondary | ICD-10-CM | POA: Diagnosis not present

## 2022-02-18 DIAGNOSIS — D3610 Benign neoplasm of peripheral nerves and autonomic nervous system, unspecified: Secondary | ICD-10-CM

## 2022-02-18 DIAGNOSIS — M21619 Bunion of unspecified foot: Secondary | ICD-10-CM | POA: Diagnosis not present

## 2022-02-18 NOTE — Telephone Encounter (Signed)
Pt called requesting a call back from [facility]. Please call pt at [phone].

## 2022-02-18 NOTE — Progress Notes (Signed)
Subjective:   Patient ID: Tina Murray, female   DOB: 71 y.o.   MRN: [id number]   HPI Patient states she is been having a lot of pain in her right foot with bunion deformity and had surgery 40 years ago on both her feet.  Also states she is getting some moderate grade pain in the forefoot right and has a corn between the hallux and second toe right foot.  Left foot also has structural deformity not to the same degree with correction also done on the left foot and patient does not smoke likes to be active   Review of Systems  All other systems reviewed and are negative.       Objective:  Physical Exam Vitals and nursing note reviewed.  Constitutional:      Appearance: She is well-developed.  Pulmonary:     Effort: Pulmonary effort is normal.  Musculoskeletal:        General: Normal range of motion.  Skin:    General: Skin is warm.  Neurological:     Mental Status: She is alert.     Neurovascular status found to be intact muscle strength was found to be adequate range of motion within normal limits.  Patient is found to have discomfort in the right forefoot which appears to be mostly between the third and fourth digits with positive Biagio Borg sign but also has some digital deformities with some inflammation of the lesser MPJs but not that sore today.  Has keratotic lesion on the inside of the right hallux and on the second toe where there is 2 bones hitting each other and has structural bunion deformity right over left with redness around the joint surface.  Patient has good digital perfusion well oriented x3     Assessment:  Undercorrected HAV deformity right with pressure between the hallux and second toe possibility for neuroma symptomatology right or possibility for low-grade inflammatory capsulitis with digital deformities      Plan:  H&P all conditions reviewed and discussed.  At this point I want to see if neuroma symptomatology appears to be the forefoot problem and I did do  sterile prep and I injected the third interspace with a steroid combination of dexamethasone Kenalog Marcaine Xylocaine and I want her to be active and then be active before she sees me next week.  We can talk about surgery I do think distal osteotomy with possibility for something to the big toe or something to remove bone spurs from the hallux and second digit may also be necessary we will discuss all this at next visit with left foot relatively stable  X-rays indicate there is structural deformity with probable bunion procedure done right with deviation of the hallux against the second toe keratotic lesion formation between the 2 toes and positive Mulder sign with elevation of the intermetatarsal angle right over left

## 2022-02-25 ENCOUNTER — Encounter: Payer: Self-pay | Admitting: Podiatry

## 2022-02-25 ENCOUNTER — Ambulatory Visit (INDEPENDENT_AMBULATORY_CARE_PROVIDER_SITE_OTHER): Payer: Medicare Other | Admitting: Podiatry

## 2022-02-25 DIAGNOSIS — M21619 Bunion of unspecified foot: Secondary | ICD-10-CM | POA: Diagnosis not present

## 2022-02-25 DIAGNOSIS — D3610 Benign neoplasm of peripheral nerves and autonomic nervous system, unspecified: Secondary | ICD-10-CM | POA: Diagnosis not present

## 2022-02-26 DIAGNOSIS — H04123 Dry eye syndrome of bilateral lacrimal glands: Secondary | ICD-10-CM | POA: Diagnosis not present

## 2022-02-26 DIAGNOSIS — H43813 Vitreous degeneration, bilateral: Secondary | ICD-10-CM | POA: Diagnosis not present

## 2022-02-26 DIAGNOSIS — H02432 Paralytic ptosis of left eyelid: Secondary | ICD-10-CM | POA: Diagnosis not present

## 2022-02-26 DIAGNOSIS — H26493 Other secondary cataract, bilateral: Secondary | ICD-10-CM | POA: Diagnosis not present

## 2022-02-27 ENCOUNTER — Ambulatory Visit: Payer: Medicare Other | Admitting: Physical Medicine and Rehabilitation

## 2022-03-07 ENCOUNTER — Telehealth: Payer: Self-pay | Admitting: Physical Medicine and Rehabilitation

## 2022-03-07 NOTE — Telephone Encounter (Signed)
Patient confirmed appointment.

## 2022-03-12 ENCOUNTER — Ambulatory Visit (INDEPENDENT_AMBULATORY_CARE_PROVIDER_SITE_OTHER): Payer: Medicare Other | Admitting: Physical Medicine and Rehabilitation

## 2022-03-12 ENCOUNTER — Encounter: Payer: Self-pay | Admitting: Physical Medicine and Rehabilitation

## 2022-03-12 VITALS — BP 106/73 | HR 83

## 2022-03-12 DIAGNOSIS — M47816 Spondylosis without myelopathy or radiculopathy, lumbar region: Secondary | ICD-10-CM | POA: Diagnosis not present

## 2022-03-12 DIAGNOSIS — M4306 Spondylolysis, lumbar region: Secondary | ICD-10-CM | POA: Diagnosis not present

## 2022-03-12 DIAGNOSIS — M545 Low back pain, unspecified: Secondary | ICD-10-CM | POA: Diagnosis not present

## 2022-03-12 DIAGNOSIS — G8929 Other chronic pain: Secondary | ICD-10-CM | POA: Diagnosis not present

## 2022-03-12 NOTE — Progress Notes (Unsigned)
Numeric Pain Rating Scale and Functional Assessment Average Pain 10   In the last MONTH (on 0-10 scale) has pain interfered with the following?  1. General activity like being  able to carry out your everyday physical activities such as walking, climbing stairs, carrying groceries, or moving a chair?  Rating(10)  Left sided lower back pain with radiculopathy into the buttocks.  +Driver, -BT, -Dye Allergies.

## 2022-03-12 NOTE — Progress Notes (Unsigned)
Tina Murray - 71 y.o. female MRN [id number]  Date of birth: 06/20/1951  Office Visit Note: Visit Date: 03/12/2022 PCP: [name], MD (Inactive) Referred by: [name], MD  Subjective: No chief complaint on file.  HPI: Tina Murray is a 71 y.o. female who comes in today for evaluation of chronic, worsening and severe bilateral lower back pain with intermittent radiation of pain to buttocks, left greater than right. Pain ongoing for several years, worsened over the last couple of months. She describes pain as sore/aching sensation, currently rates as 10 out of 10. Patient reports some relief of pain with home exercise regimen, rest and use of medications. Patient currently taking Celebrex as needed. Patient states she has attended formal physical therapy in the past and reports no relief of pain, states her pain increased with these treatments. Patients lumbar MRI imaging from April exhibits mild levoconvex upper lumbar scoliosis and dextroconvex lower lumbar scoliosis, there is 4 mm anterolisthesis with suspicion for a chronic left L5 pars defect at the level of L5-S1. There is also multi-level facet hypertrophy, most advanced on the left at L4-L5. No high grade spinal canal stenosis noted. Patient was previously treated by Dr. Suella Broad at [facility] We were able to get procedure notes and in review patient received both right sacroiliac joint injection with minimal relief and right S1 transforaminal epidural steroid injection that provided pain relief for almost 1 year. Patient states she would like to talk about lumbar injections, however she does not wish to have steroid injection. Patient denies focal weakness, numbness and tingling. Patient denies recent trauma or falls.    Review of Systems  Musculoskeletal:  Positive for back pain.  Neurological:  Negative for tingling, sensory change, focal weakness and weakness.  All other systems reviewed and are negative.   Otherwise per HPI.  Assessment & Plan: Visit Diagnoses:    ICD-10-CM   1. Chronic bilateral low back pain without sciatica  M54.50    G89.29     2. Spondylosis without myelopathy or radiculopathy, lumbar region  M47.816     3. Facet arthropathy, lumbar  M47.816     4. Pars defect of lumbar spine  M43.06        Plan: Findings:  Chronic, worsening and severe bilateral lower back pain with intermittent radiation to buttocks. Patient continues to have severe pain despite good conservative therapies such as formal physical therapy, osteopathic manipulation, rest and medications. Patients clinical presentation and exam are consistent with facet mediated pain, could be a type of facet joint syndrome with intermittent referral to buttocks. Patient does have pain with lumbar extension upon exam today. Next step is to perform diagnostic and hopefully therapeutic bilateral L4-L5 and L5-S1 facet joint/medial branch blocks under fluoroscopic guidance. If patient gets significant relief of pain with double diagnostic blocks we did discuss possibility of longer sustained pain relief with radiofrequency ablation. I did provide patient with educational material regarding this procedure to take home and review. Patient verbalized understanding of double diagnostic block paradigm and possibility of performing radiofrequency ablation. No red flag symptoms noted upon exam today.     Meds & Orders: No orders of the defined types were placed in this encounter.  No orders of the defined types were placed in this encounter.   Follow-up: Return for Bilateral L4-L5 and L5-S1 facet joint/medial branch blocks.   Procedures: No procedures performed      Clinical History: EXAM: MRI LUMBAR SPINE WITHOUT CONTRAST   TECHNIQUE:  Multiplanar, multisequence MR imaging of the lumbar spine was performed. No intravenous contrast was administered.   COMPARISON:  MR lumbar 05/08/2017; X-ray lumbar 10/31/2021.    FINDINGS: Segmentation: The lowest lumbar type non-rib-bearing vertebra is labeled as L5.   Alignment: Mild levoconvex upper lumbar scoliosis dextroconvex lower lumbar scoliosis. 4 mm anterolisthesis at the L5-S1 level with suspicion for a chronic left L5 pars defect. 3 mm degenerative retrolisthesis at L2-3.   Vertebrae: Disc desiccation at all levels between L2-S1 with loss of disc height especially at L2-3 and L4-5. Degenerative endplate findings are noted especially at L2-3 and L4-5, and are increased from prior particularly at L4-5. Facet and perifacet edema on the left at L3-4 and L4-5, and on the right at L5-S1. Suspected chronic deformity at the upper L3 vertebral level likely from an old healed fracture.   Conus medullaris and cauda equina: Conus extends to the L1-2 level. Conus and cauda equina appear normal.   Paraspinal and other soft tissues: T2 hyperintense lesion posteriorly in the right hepatic lobe on image 5 series 14, stable from 2018. 1.1 by 0.9 cm T2 hyperintense lesion centrally in the right hepatic lobe on image 2 series 14, nonspecific although statistically likely to be benign. Mild chronic fullness of the left renal collecting system and left proximal ureter, mild fluid signal intensity fullness of both collecting systems and proximal ureters, significance uncertain.   Disc levels:   T12-L1: No impingement.  Small central disc protrusion.   L1-2: No impingement.  Mild disc bulge.   L2-3: Mild displacement of the right L2 nerve in the lateral extraforaminal space due to disc osteophyte complex, similar to prior.   L3-4: Mild left foraminal stenosis with mild left and borderline right subarticular lateral recess stenosis and borderline central narrowing of the thecal sac due to disc bulge, intervertebral spurring, and facet arthropathy. Trace right facet joint effusion. Finding similar to prior.   L4-5: Moderate left foraminal stenosis with mild  bilateral subarticular lateral recess stenosis and borderline central narrowing of the thecal sac due to disc bulge, intervertebral spurring, and left greater than right facet arthropathy. The left foraminal impingement is mildly worsened from prior. Small synovial cyst posterior to the left facet joint.   L5-S1: Moderate right and borderline left foraminal stenosis with borderline left subarticular lateral recess stenosis due to disc uncovering, disc bulge, and facet arthropathy. Left foraminal findings are mildly improved compared to prior.   IMPRESSION: 1. Lumbar spondylosis and degenerative disc disease, causing moderate impingement at L4-5 and L5-S1, and mild impingement at L2-3 and L3-4, as detailed above. The most part this is similar to the prior exam, although the left foraminal impingement is mildly worsened at L4-5 and mildly improved at L5-S1 compared to 05/08/2017. 2. Two T2 hyperintense lesions of the right hepatic lobe are noted, one stable from 2018 and the other potentially obscured on the prior exam due to a saturation band. These are technically nonspecific although statistically likely to be benign. 3. Lumbar scoliosis. 4. Potential chronic left L5 pars defect with 4 mm anterolisthesis at L5-S1. 5. Multilevel substantial degenerative facet arthropathy with associated facet edema.     Electronically Signed   By: Van Clines M.D.   On: 12/10/2021 11:18   She reports that she quit smoking about 43 years ago. Her smoking use included cigarettes. She has never used smokeless tobacco. No results for input(s): "HGBA1C", "LABURIC" in the last 8760 hours.  Objective:  VS:  HT:    WT:  BMI:     BP:106/73  HR:83bpm  TEMP: ( )  RESP:  Physical Exam Vitals and nursing note reviewed.  HENT:     Head: Normocephalic and atraumatic.     Right Ear: External ear normal.     Left Ear: External ear normal.     Nose: Nose normal.     Mouth/Throat:     Mouth:  Mucous membranes are moist.  Eyes:     Extraocular Movements: Extraocular movements intact.  Cardiovascular:     Rate and Rhythm: Normal rate.     Pulses: Normal pulses.  Pulmonary:     Effort: Pulmonary effort is normal.  Abdominal:     General: Abdomen is flat. There is no distension.  Musculoskeletal:        General: Tenderness present.     Cervical back: Normal range of motion.     Comments: Pt is slow to rise from seated position to standing. Concordant low back pain with facet loading, lumbar spine extension and rotation. Strong distal strength without clonus, no pain upon palpation of greater trochanters. Sensation intact bilaterally. Walks independently, gait steady.   Skin:    General: Skin is warm and dry.     Capillary Refill: Capillary refill takes less than 2 seconds.  Neurological:     General: No focal deficit present.     Mental Status: She is alert and oriented to person, place, and time.  Psychiatric:        Mood and Affect: Mood normal.        Behavior: Behavior normal.     Ortho Exam  Imaging: No results found.  Past Medical/Family/Surgical/Social History: Medications & Allergies reviewed per EMR, new medications updated. Patient Active Problem List   Diagnosis Date Noted   Chronic migraine w/o aura w/o status migrainosus, not intractable 01/10/2021   Anxiety 01/10/2021   Chronic migraine 03/03/2020   Chronic neck pain 03/03/2020   Paresthesia 11/24/2017   Neck pain 11/24/2017   Past Medical History:  Diagnosis Date   DDD (degenerative disc disease), lumbar    GAD (generalized anxiety disorder)    Headache    Hypercholesterolemia    Hyperglycemia    Hyperlipemia    Lumbar radiculopathy    Migraine    Osteoarthritis    Paresthesia of foot    right   Sacroiliac joint pain    Sciatica of right side    Scoliosis    Seasonal allergies    Family History  Problem Relation Age of Onset   Stroke Mother        age 8   COPD Mother    Other  Father        age 32 - complications from surgery   Hyperlipidemia Brother    Heart attack Brother        age 40   Past Surgical History:  Procedure Laterality Date   OVARIAN CYST REMOVAL Left 1977   Social History   Occupational History   Occupation: Retired  Tobacco Use   Smoking status: Former    Types: Cigarettes    Quit date: 03/03/1979    Years since quitting: 43.0   Smokeless tobacco: Never  Vaping Use   Vaping Use: Never used  Substance and Sexual Activity   Alcohol use: Yes    Comment: social   Drug use: Never   Sexual activity: Not on file

## 2022-03-15 ENCOUNTER — Telehealth: Payer: Self-pay | Admitting: Physical Medicine and Rehabilitation

## 2022-03-15 NOTE — Telephone Encounter (Signed)
Pt called requesting a call back from PA mMgan. Pt states to have a question. Please call pt at [phone]

## 2022-03-27 DIAGNOSIS — S99922A Unspecified injury of left foot, initial encounter: Secondary | ICD-10-CM | POA: Diagnosis not present

## 2022-03-27 DIAGNOSIS — S9032XA Contusion of left foot, initial encounter: Secondary | ICD-10-CM | POA: Diagnosis not present

## 2022-03-28 DIAGNOSIS — M159 Polyosteoarthritis, unspecified: Secondary | ICD-10-CM | POA: Diagnosis not present

## 2022-03-28 DIAGNOSIS — Z681 Body mass index (BMI) 19 or less, adult: Secondary | ICD-10-CM | POA: Diagnosis not present

## 2022-03-29 ENCOUNTER — Ambulatory Visit: Payer: Medicare Other | Admitting: Podiatry

## 2022-04-08 NOTE — Progress Notes (Signed)
Subjective:   Patient ID: Tina Murray, female   DOB: 71 y.o.   MRN: [id number]   HPI Patient states she is improved from the injection that we did several weeks ago states she still has discomfort but much better   ROS      Objective:  Physical Exam  Alert status intact diminishment of discomfort right inflammation still noted upon deep palpation but much better than previous     Assessment:  Improvement around the first MPJ right with diminished inflammation fluid buildup     Plan:  Advised on wider shoe gear ice therapy and anti-inflammatories as needed.  Patient will be seen back to recheck as needed encouraged to call questions concerns

## 2022-04-10 ENCOUNTER — Encounter: Payer: Self-pay | Admitting: Physical Medicine and Rehabilitation

## 2022-04-10 ENCOUNTER — Ambulatory Visit: Payer: Self-pay

## 2022-04-10 ENCOUNTER — Ambulatory Visit (INDEPENDENT_AMBULATORY_CARE_PROVIDER_SITE_OTHER): Payer: Medicare Other | Admitting: Physical Medicine and Rehabilitation

## 2022-04-10 VITALS — BP 110/68 | HR 78

## 2022-04-10 DIAGNOSIS — M47816 Spondylosis without myelopathy or radiculopathy, lumbar region: Secondary | ICD-10-CM | POA: Diagnosis not present

## 2022-04-10 MED ORDER — BUPIVACAINE HCL 0.5 % IJ SOLN
3.0000 mL | Freq: Once | INTRAMUSCULAR | Status: AC
  Administered 2022-04-10: 3 mL

## 2022-04-10 NOTE — Progress Notes (Unsigned)
Pt state lower back pain. Pt state laying down and getting out of bed in the morning makes the pain worse. Pt state she takes pain meds and uses heat to help ease her pain.  Numeric Pain Rating Scale and Functional Assessment Average Pain 6   In the last MONTH (on 0-10 scale) has pain interfered with the following?  1. General activity like being  able to carry out your everyday physical activities such as walking, climbing stairs, carrying groceries, or moving a chair?  Rating(10)   +Driver, -BT, -Dye Allergies.

## 2022-04-10 NOTE — Patient Instructions (Signed)

## 2022-04-11 ENCOUNTER — Other Ambulatory Visit: Payer: Self-pay | Admitting: Physical Medicine and Rehabilitation

## 2022-04-11 DIAGNOSIS — M545 Low back pain, unspecified: Secondary | ICD-10-CM

## 2022-04-11 DIAGNOSIS — M47816 Spondylosis without myelopathy or radiculopathy, lumbar region: Secondary | ICD-10-CM

## 2022-04-11 NOTE — Progress Notes (Signed)
I did speak with patient to clarify pain diary, greater than 50% relief with diagnostic facet blocks. We will proceed with second set.

## 2022-04-11 NOTE — Progress Notes (Signed)
Tina Murray - 71 y.o. female MRN [id number]  Date of birth: 12-21-1950  Office Visit Note: Visit Date: 04/10/2022 PCP: [name], MD (Inactive) Referred by: Lorine Bears, NP  Subjective: Chief Complaint  Patient presents with   Lower Back - Pain   HPI:  Tina Murray is a 71 y.o. female who comes in today at the request of Barnet Pall, FNP for planned Bilateral  L4-5 and L5-S1 Lumbar facet/medial branch block with fluoroscopic guidance.  The patient has failed conservative care including home exercise, medications, time and activity modification.  This injection will be diagnostic and hopefully therapeutic.  Please see requesting physician notes for further details and justification.  Exam has shown concordant pain with facet joint loading.   ROS Otherwise per HPI.  Assessment & Plan: Visit Diagnoses:    ICD-10-CM   1. Spondylosis without myelopathy or radiculopathy, lumbar region  M47.816 XR C-ARM NO REPORT    Facet Injection    bupivacaine (MARCAINE) 0.5 % (with pres) injection 3 mL      Plan: No additional findings.   Meds & Orders:  Meds ordered this encounter  Medications   bupivacaine (MARCAINE) 0.5 % (with pres) injection 3 mL    Orders Placed This Encounter  Procedures   Facet Injection   XR C-ARM NO REPORT    Follow-up: Return for Review Pain Diary.   Procedures: No procedures performed  Lumbar Diagnostic Facet Joint Nerve Block with Fluoroscopic Guidance   Patient: Tina Murray      Date of Birth: 05-Aug-1951 MRN: [id number] PCP: [name], MD (Inactive)      Visit Date: 04/10/2022   Universal Protocol:    Date/Time: 04/11/2309:46 PM  Consent Given By: the patient  Position: PRONE  Additional Comments: Vital signs were monitored before and after the procedure. Patient was prepped and draped in the usual sterile fashion. The correct patient, procedure, and site was verified.   Injection Procedure Details:   Procedure  diagnoses:  1. Spondylosis without myelopathy or radiculopathy, lumbar region      Meds Administered:  Meds ordered this encounter  Medications   bupivacaine (MARCAINE) 0.5 % (with pres) injection 3 mL     Laterality: Bilateral  Location/Site: L4-L5, L3 and L4 medial branches and L5-S1, L4 medial branch and L5 dorsal ramus  Needle: 5.0 in., 25 ga.  Short bevel or Quincke spinal needle  Needle Placement: Oblique pedical  Findings:   -Comments: There was excellent flow of contrast along the articular pillars without intravascular flow.  Procedure Details: The fluoroscope beam is vertically oriented in AP and then obliqued 15 to 20 degrees to the ipsilateral side of the desired nerve to achieve the "Scotty dog" appearance.  The skin over the target area of the junction of the superior articulating process and the transverse process (sacral ala if blocking the L5 dorsal rami) was locally anesthetized with a 1 ml volume of 1% Lidocaine without Epinephrine.  The spinal needle was inserted and advanced in a trajectory view down to the target.   After contact with periosteum and negative aspirate for blood and CSF, correct placement without intravascular or epidural spread was confirmed by injecting 0.5 ml. of Isovue-250.  A spot radiograph was obtained of this image.    Next, a 0.5 ml. volume of the injectate described above was injected. The needle was then redirected to the other facet joint nerves mentioned above if needed.  Prior to the procedure, the patient was given a  Pain Diary which was completed for baseline measurements.  After the procedure, the patient rated their pain every 30 minutes and will continue rating at this frequency for a total of 5 hours.  The patient has been asked to complete the Diary and return to [place] by mail, fax or hand delivered as soon as possible.   Additional Comments:  The patient tolerated the procedure well Dressing: 2 x 2 sterile gauze and Band-Aid     Post-procedure details: Patient was observed during the procedure. Post-procedure instructions were reviewed.  Patient left the clinic in stable condition.   Clinical History: EXAM: MRI LUMBAR SPINE WITHOUT CONTRAST   TECHNIQUE: Multiplanar, multisequence MR imaging of the lumbar spine was performed. No intravenous contrast was administered.   COMPARISON:  MR lumbar 05/08/2017; X-ray lumbar 10/31/2021.   FINDINGS: Segmentation: The lowest lumbar type non-rib-bearing vertebra is labeled as L5.   Alignment: Mild levoconvex upper lumbar scoliosis dextroconvex lower lumbar scoliosis. 4 mm anterolisthesis at the L5-S1 level with suspicion for a chronic left L5 pars defect. 3 mm degenerative retrolisthesis at L2-3.   Vertebrae: Disc desiccation at all levels between L2-S1 with loss of disc height especially at L2-3 and L4-5. Degenerative endplate findings are noted especially at L2-3 and L4-5, and are increased from prior particularly at L4-5. Facet and perifacet edema on the left at L3-4 and L4-5, and on the right at L5-S1. Suspected chronic deformity at the upper L3 vertebral level likely from an old healed fracture.   Conus medullaris and cauda equina: Conus extends to the L1-2 level. Conus and cauda equina appear normal.   Paraspinal and other soft tissues: T2 hyperintense lesion posteriorly in the right hepatic lobe on image 5 series 14, stable from 2018. 1.1 by 0.9 cm T2 hyperintense lesion centrally in the right hepatic lobe on image 2 series 14, nonspecific although statistically likely to be benign. Mild chronic fullness of the left renal collecting system and left proximal ureter, mild fluid signal intensity fullness of both collecting systems and proximal ureters, significance uncertain.   Disc levels:   T12-L1: No impingement.  Small central disc protrusion.   L1-2: No impingement.  Mild disc bulge.   L2-3: Mild displacement of the right L2 nerve in the  lateral extraforaminal space due to disc osteophyte complex, similar to prior.   L3-4: Mild left foraminal stenosis with mild left and borderline right subarticular lateral recess stenosis and borderline central narrowing of the thecal sac due to disc bulge, intervertebral spurring, and facet arthropathy. Trace right facet joint effusion. Finding similar to prior.   L4-5: Moderate left foraminal stenosis with mild bilateral subarticular lateral recess stenosis and borderline central narrowing of the thecal sac due to disc bulge, intervertebral spurring, and left greater than right facet arthropathy. The left foraminal impingement is mildly worsened from prior. Small synovial cyst posterior to the left facet joint.   L5-S1: Moderate right and borderline left foraminal stenosis with borderline left subarticular lateral recess stenosis due to disc uncovering, disc bulge, and facet arthropathy. Left foraminal findings are mildly improved compared to prior.   IMPRESSION: 1. Lumbar spondylosis and degenerative disc disease, causing moderate impingement at L4-5 and L5-S1, and mild impingement at L2-3 and L3-4, as detailed above. The most part this is similar to the prior exam, although the left foraminal impingement is mildly worsened at L4-5 and mildly improved at L5-S1 compared to 05/08/2017. 2. Two T2 hyperintense lesions of the right hepatic lobe are noted, one stable from 2018  and the other potentially obscured on the prior exam due to a saturation band. These are technically nonspecific although statistically likely to be benign. 3. Lumbar scoliosis. 4. Potential chronic left L5 pars defect with 4 mm anterolisthesis at L5-S1. 5. Multilevel substantial degenerative facet arthropathy with associated facet edema.     Electronically Signed   By: Van Clines M.D.   On: 12/10/2021 11:18     Objective:  VS:  HT:    WT:   BMI:     BP:110/68  HR:78bpm  TEMP: ( )   RESP:  Physical Exam Vitals and nursing note reviewed.  Constitutional:      General: She is not in acute distress.    Appearance: Normal appearance. She is not ill-appearing.  HENT:     Head: Normocephalic and atraumatic.     Right Ear: External ear normal.     Left Ear: External ear normal.  Eyes:     Extraocular Movements: Extraocular movements intact.  Cardiovascular:     Rate and Rhythm: Normal rate.     Pulses: Normal pulses.  Pulmonary:     Effort: Pulmonary effort is normal. No respiratory distress.  Abdominal:     General: There is no distension.     Palpations: Abdomen is soft.  Musculoskeletal:        General: Tenderness present.     Cervical back: Neck supple.     Right lower leg: No edema.     Left lower leg: No edema.     Comments: Patient has good distal strength with no pain over the greater trochanters.  No clonus or focal weakness.  Skin:    Findings: No erythema, lesion or rash.  Neurological:     General: No focal deficit present.     Mental Status: She is alert and oriented to person, place, and time.     Sensory: No sensory deficit.     Motor: No weakness or abnormal muscle tone.     Coordination: Coordination normal.  Psychiatric:        Mood and Affect: Mood normal.        Behavior: Behavior normal.      Imaging: No results found.

## 2022-04-11 NOTE — Procedures (Signed)
Lumbar Diagnostic Facet Joint Nerve Block with Fluoroscopic Guidance   Patient: Tina Murray      Date of Birth: March 04, 1951 MRN: [id number] PCP: [name], MD (Inactive)      Visit Date: 04/10/2022   Universal Protocol:    Date/Time: 04/11/2309:46 PM  Consent Given By: the patient  Position: PRONE  Additional Comments: Vital signs were monitored before and after the procedure. Patient was prepped and draped in the usual sterile fashion. The correct patient, procedure, and site was verified.   Injection Procedure Details:   Procedure diagnoses:  1. Spondylosis without myelopathy or radiculopathy, lumbar region      Meds Administered:  Meds ordered this encounter  Medications   bupivacaine (MARCAINE) 0.5 % (with pres) injection 3 mL     Laterality: Bilateral  Location/Site: L4-L5, L3 and L4 medial branches and L5-S1, L4 medial branch and L5 dorsal ramus  Needle: 5.0 in., 25 ga.  Short bevel or Quincke spinal needle  Needle Placement: Oblique pedical  Findings:   -Comments: There was excellent flow of contrast along the articular pillars without intravascular flow.  Procedure Details: The fluoroscope beam is vertically oriented in AP and then obliqued 15 to 20 degrees to the ipsilateral side of the desired nerve to achieve the "Scotty dog" appearance.  The skin over the target area of the junction of the superior articulating process and the transverse process (sacral ala if blocking the L5 dorsal rami) was locally anesthetized with a 1 ml volume of 1% Lidocaine without Epinephrine.  The spinal needle was inserted and advanced in a trajectory view down to the target.   After contact with periosteum and negative aspirate for blood and CSF, correct placement without intravascular or epidural spread was confirmed by injecting 0.5 ml. of Isovue-250.  A spot radiograph was obtained of this image.    Next, a 0.5 ml. volume of the injectate described above was injected.  The needle was then redirected to the other facet joint nerves mentioned above if needed.  Prior to the procedure, the patient was given a Pain Diary which was completed for baseline measurements.  After the procedure, the patient rated their pain every 30 minutes and will continue rating at this frequency for a total of 5 hours.  The patient has been asked to complete the Diary and return to [place] by mail, fax or hand delivered as soon as possible.   Additional Comments:  The patient tolerated the procedure well Dressing: 2 x 2 sterile gauze and Band-Aid    Post-procedure details: Patient was observed during the procedure. Post-procedure instructions were reviewed.  Patient left the clinic in stable condition.

## 2022-04-23 ENCOUNTER — Ambulatory Visit (INDEPENDENT_AMBULATORY_CARE_PROVIDER_SITE_OTHER): Payer: Medicare Other | Admitting: Physical Medicine and Rehabilitation

## 2022-04-23 ENCOUNTER — Encounter: Payer: Self-pay | Admitting: Physical Medicine and Rehabilitation

## 2022-04-23 DIAGNOSIS — M7918 Myalgia, other site: Secondary | ICD-10-CM

## 2022-04-23 DIAGNOSIS — G8929 Other chronic pain: Secondary | ICD-10-CM

## 2022-04-23 DIAGNOSIS — M542 Cervicalgia: Secondary | ICD-10-CM | POA: Diagnosis not present

## 2022-04-23 DIAGNOSIS — M5412 Radiculopathy, cervical region: Secondary | ICD-10-CM

## 2022-04-23 NOTE — Progress Notes (Unsigned)
Tina Murray - 71 y.o. female MRN [id number]  Date of birth: 1951/03/28  Office Visit Note: Visit Date: 04/23/2022 PCP: [name], MD (Inactive) Referred by: No ref. provider found  Subjective: Chief Complaint  Patient presents with   Neck - Pain   HPI: Tina Murray is a 71 y.o. female who comes in today for evaluation of chronic, worsening and severe bilateral neck pain radiating to shoulders and down arms to hands. We are currently treating patient for chronic back issues, however we have not formally evaluated her neck. Pain ongoing for several years and is exacerbated by movement and activity. She describes pain as sore, aching and tightness sensation, currently rates as 7 out of 10. Some relief of pain with home exercise regimen, rest and use of medications. Currently using Voltaren gel as needed. Cervical MRI imaging from 2019 exhibits multilevel facet hypertrophy. There is also retrolisthesis and moderate spinal canal stenosis at C5-C6. No history of cervical surgery/cervical injections. Patient was previously treated by Dr. Suella Broad at [facility], states she does not wish to receive any type of steroid injections at this time. Patient states she wakes up most days in pain, reports chronic issues with generalized pain and stiffness. Patient denies focal weakness. Patient denies recent trauma or falls.   Patient is scheduled for second set of bilateral L4-L5 and L5-S1 diagnostic facet blocks in our office on 04/29/2022.    Review of Systems  Musculoskeletal:  Positive for myalgias and neck pain.  Neurological:  Positive for tingling and sensory change. Negative for focal weakness and weakness.  All other systems reviewed and are negative.  Otherwise per HPI.  Assessment & Plan: Visit Diagnoses:    ICD-10-CM   1. Chronic neck pain  M54.2 Ambulatory referral to Physical Therapy   G89.29     2. Radiculopathy, cervical region  M54.12 Ambulatory referral to Physical  Therapy    3. Myofascial pain syndrome  M79.18 Ambulatory referral to Physical Therapy       Plan: Findings:  1. Chronic, worsening and severe bilateral neck pain radiating to shoulders and down both arms to hands.  Patient continues to have severe pain despite good conservative therapy such as home exercise regimen, rest and use of medications.  Patient's clinical presentation and exam are consistent with cervical radiculopathy, there is moderate spinal canal stenosis noted at the level of C5-C6 on imaging from 2019.  I also feel patient's symptoms could be myofascial related as she does have multiple palpable trigger points noted to bilateral levator scapula and trapezius muscles.  I did speak with patient in detail regarding treatment plan, we did discuss performing cervical epidural steroid injection, however patient does not wish to have injection with steroid medication at this time. I did discuss re-grouping with our in house physical therapy team and she is agreeable with this plan. I will place order for PT with a focus on manual treatments and dry needling. If her pain persists post PT we did discuss moving forward with epidural injection, we could also look at medication management. No red flag symptoms noted upon exam today.   2. Second set of diagnostic lumbar facet blocks upcoming, if she gets good pain relief with this procedure we will proceed with radiofrequency ablation.       Meds & Orders: No orders of the defined types were placed in this encounter.   Orders Placed This Encounter  Procedures   Ambulatory referral to Physical Therapy    Follow-up: Return  if symptoms worsen or fail to improve.   Procedures: No procedures performed      Clinical History: MRI of the cervical spine  FINDINGS: :  On sagittal images, the spine is imaged from above the cervicomedullary junction to T2.   The spinal cord is of normal caliber and signal.   There is a 3 mm of retrolisthesis of C5  upon C6 associated with severe loss of disc height.  There is 1 to 2 mm of retrolisthesis of C6 upon C7 associated with moderate loss of disc height.  There is also moderate loss of disc height at C4-C5.   The vertebral bodies have normal signal.     The discs and interspaces were further evaluated on axial views from C2 to T1 as follows:   C2-C3: There is mild right facet hypertrophy.  The neural foramen foramina are not narrowed and there is no nerve root compression.   C3-C4: There is right uncovertebral spurring and mild left facet hypertrophy.  There is mild to moderate left foraminal narrowing but no nerve root compression.   C4-C5: The central canal is narrowed but not enough to be considered spinal stenosis.  There is left uncovertebral spurring and left facet hypertrophy.  There is moderately severe left foraminal narrowing with some potential for left C5 nerve root compression   C5-C6: There is mild retrolisthesis of C5 upon C6 and severe right,  moderate left uncovertebral spurring and mild right facet hypertrophy leading to moderate spinal stenosis and right greater than left foraminal narrowing..   The neural foramina are moderately narrowed.    C6-C7: There is minimal spinal stenosis due to disc protrusion, borderline retrolisthesis and mild uncovertebral spurring.  The neuroforamina are mildly to moderately narrowed but there does not appear to be any nerve root compression.   C7-T1: There is mild disc bulging that does not lead to any spinal stenosis or nerve root compression.  IMPRESSION: This MRI of the cervical spine without contrast shows the following: 1.   The spinal cord appears normal. 2.   At C4-C5, there are degenerative changes more on the left causing moderately severe left foraminal narrowing with some potential for left C5 nerve root compression. 3.   At C5-C6, there is retrolisthesis and other degenerative changes causing moderate spinal stenosis.  Neural foramina  are moderately narrowed but there does not appear to be any definite nerve root compression. 4.   At C6-C7, there is minimal spinal stenosis but no nerve root compression. 5.   Milder degenerative changes at C2-C3, C3-C4 and C7-T1 do not lead to any spinal stenosis or nerve root compression.  Richard A. Felecia Shelling, MD, PhD, Charlynn Grimes   She reports that she quit smoking about 43 years ago. Her smoking use included cigarettes. She has never used smokeless tobacco. No results for input(s): "HGBA1C", "LABURIC" in the last 8760 hours.  Objective:  VS:  HT:    WT:   BMI:     BP:   HR: bpm  TEMP: ( )  RESP:  Physical Exam Vitals and nursing note reviewed.  HENT:     Head: Normocephalic and atraumatic.     Right Ear: External ear normal.     Left Ear: External ear normal.     Nose: Nose normal.     Mouth/Throat:     Mouth: Mucous membranes are moist.  Eyes:     Extraocular Movements: Extraocular movements intact.  Cardiovascular:     Rate and Rhythm: Normal rate.  Pulses: Normal pulses.  Pulmonary:     Effort: Pulmonary effort is normal.  Abdominal:     General: Abdomen is flat. There is no distension.  Musculoskeletal:        General: Tenderness present.     Cervical back: Normal range of motion.     Comments: Discomfort noted with flexion, extension and side-to-side rotation. Patient has good strength in the upper extremities including 5 out of 5 strength in wrist extension, long finger flexion and APB.  There is no atrophy of the hands intrinsically.  Sensation intact bilaterally. Palpable trigger points noted to bilateral levator scapulae and trapezius  muscles. Negative Hoffman's sign. Negative Spurling's sign.    Skin:    General: Skin is warm and dry.     Capillary Refill: Capillary refill takes less than 2 seconds.  Neurological:     General: No focal deficit present.     Mental Status: She is alert and oriented to person, place, and time.  Psychiatric:        Mood and Affect:  Mood normal.        Behavior: Behavior normal.     Ortho Exam  Imaging: No results found.  Past Medical/Family/Surgical/Social History: Medications & Allergies reviewed per EMR, new medications updated. Patient Active Problem List   Diagnosis Date Noted   Chronic migraine w/o aura w/o status migrainosus, not intractable 01/10/2021   Anxiety 01/10/2021   Chronic migraine 03/03/2020   Chronic neck pain 03/03/2020   Paresthesia 11/24/2017   Neck pain 11/24/2017   Past Medical History:  Diagnosis Date   DDD (degenerative disc disease), lumbar    GAD (generalized anxiety disorder)    Headache    Hypercholesterolemia    Hyperglycemia    Hyperlipemia    Lumbar radiculopathy    Migraine    Osteoarthritis    Paresthesia of foot    right   Sacroiliac joint pain    Sciatica of right side    Scoliosis    Seasonal allergies    Family History  Problem Relation Age of Onset   Stroke Mother        age 76   COPD Mother    Other Father        age 37 - complications from surgery   Hyperlipidemia Brother    Heart attack Brother        age 71   Past Surgical History:  Procedure Laterality Date   OVARIAN CYST REMOVAL Left 1977   Social History   Occupational History   Occupation: Retired  Tobacco Use   Smoking status: Former    Types: Cigarettes    Quit date: 03/03/1979    Years since quitting: 43.1   Smokeless tobacco: Never  Vaping Use   Vaping Use: Never used  Substance and Sexual Activity   Alcohol use: Yes    Comment: social   Drug use: Never   Sexual activity: Not on file

## 2022-04-23 NOTE — Progress Notes (Unsigned)
Neck pain chronically, radiating down both arms at times; no constant.

## 2022-04-29 ENCOUNTER — Telehealth: Payer: Self-pay | Admitting: Neurology

## 2022-04-29 ENCOUNTER — Ambulatory Visit (INDEPENDENT_AMBULATORY_CARE_PROVIDER_SITE_OTHER): Payer: Medicare Other | Admitting: Physical Medicine and Rehabilitation

## 2022-04-29 ENCOUNTER — Ambulatory Visit: Payer: Self-pay

## 2022-04-29 DIAGNOSIS — M47816 Spondylosis without myelopathy or radiculopathy, lumbar region: Secondary | ICD-10-CM

## 2022-04-29 MED ORDER — BUPIVACAINE HCL 0.5 % IJ SOLN
3.0000 mL | Freq: Once | INTRAMUSCULAR | Status: AC
  Administered 2022-04-29: 3 mL

## 2022-04-29 NOTE — Telephone Encounter (Signed)
Please have patient follow up with primary care physician for this referral. Thank you!

## 2022-04-29 NOTE — Telephone Encounter (Signed)
Pt called wanting to know if she can get a referral to a Rheumatologist so that she can get Reclast Infusions. Please advise.

## 2022-04-29 NOTE — Patient Instructions (Signed)

## 2022-04-29 NOTE — Progress Notes (Signed)
Numeric Pain Rating Scale and Functional Assessment Average Pain 10   In the last MONTH (on 0-10 scale) has pain interfered with the following?  1. General activity like being  able to carry out your everyday physical activities such as walking, climbing stairs, carrying groceries, or moving a chair?  Rating(5)   +Driver, -BT, -Dye Allergies. 127/90

## 2022-04-30 ENCOUNTER — Other Ambulatory Visit: Payer: Self-pay | Admitting: Physical Medicine and Rehabilitation

## 2022-04-30 DIAGNOSIS — M47816 Spondylosis without myelopathy or radiculopathy, lumbar region: Secondary | ICD-10-CM

## 2022-04-30 DIAGNOSIS — G8929 Other chronic pain: Secondary | ICD-10-CM

## 2022-05-01 ENCOUNTER — Telehealth: Payer: Self-pay | Admitting: Physical Medicine and Rehabilitation

## 2022-05-01 NOTE — Telephone Encounter (Signed)
IC advised.  

## 2022-05-01 NOTE — Telephone Encounter (Signed)
Patient called advised she got an epidural on 04/29/2022 and wanted to know if it was ok to get the Covid-19,RSV and Flu vaccine. The number to contact patient is [phone]

## 2022-05-03 ENCOUNTER — Telehealth: Payer: Self-pay

## 2022-05-03 NOTE — Telephone Encounter (Signed)
I called and scheduled patient for RFA but she has questions regarding the procedure. Do's/dont's what she can expect. She asked about sedation but I did advise her we do not use sedation. She is asking for call back from one of you to discuss.

## 2022-05-04 DIAGNOSIS — Z23 Encounter for immunization: Secondary | ICD-10-CM | POA: Diagnosis not present

## 2022-05-08 NOTE — Procedures (Signed)
Lumbar Diagnostic Facet Joint Nerve Block with Fluoroscopic Guidance   Patient: Tina Murray      Date of Birth: [birth year] MRN: [id number] PCP: [name], MD (Inactive)      Visit Date: 04/29/2022   Universal Protocol:    Date/Time: 09/27/235:56 AM  Consent Given By: the patient  Position: PRONE  Additional Comments: Vital signs were monitored before and after the procedure. Patient was prepped and draped in the usual sterile fashion. The correct patient, procedure, and site was verified.   Injection Procedure Details:   Procedure diagnoses:  1. Spondylosis without myelopathy or radiculopathy, lumbar region      Meds Administered:  Meds ordered this encounter  Medications   bupivacaine (MARCAINE) 0.5 % (with pres) injection 3 mL     Laterality: Bilateral  Location/Site: L4-L5, L3 and L4 medial branches and L5-S1, L4 medial branch and L5 dorsal ramus  Needle: 5.0 in., 25 ga.  Short bevel or Quincke spinal needle  Needle Placement: Oblique pedical  Findings:   -Comments: There was excellent flow of contrast along the articular pillars without intravascular flow.  Procedure Details: The fluoroscope beam is vertically oriented in AP and then obliqued 15 to 20 degrees to the ipsilateral side of the desired nerve to achieve the "Scotty dog" appearance.  The skin over the target area of the junction of the superior articulating process and the transverse process (sacral ala if blocking the L5 dorsal rami) was locally anesthetized with a 1 ml volume of 1% Lidocaine without Epinephrine.  The spinal needle was inserted and advanced in a trajectory view down to the target.   After contact with periosteum and negative aspirate for blood and CSF, correct placement without intravascular or epidural spread was confirmed by injecting 0.5 ml. of Isovue-250.  A spot radiograph was obtained of this image.    Next, a 0.5 ml. volume of the injectate described above was injected.  The needle was then redirected to the other facet joint nerves mentioned above if needed.  Prior to the procedure, the patient was given a Pain Diary which was completed for baseline measurements.  After the procedure, the patient rated their pain every 30 minutes and will continue rating at this frequency for a total of 5 hours.  The patient has been asked to complete the Diary and return to [place] by mail, fax or hand delivered as soon as possible.   Additional Comments:  The patient tolerated the procedure well Dressing: 2 x 2 sterile gauze and Band-Aid    Post-procedure details: Patient was observed during the procedure. Post-procedure instructions were reviewed.  Patient left the clinic in stable condition.

## 2022-05-08 NOTE — Progress Notes (Signed)
Tina Murray - 71 y.o. female MRN [id number]  Date of birth: 11-Jul-1951  Office Visit Note: Visit Date: 04/29/2022 PCP: [name], MD (Inactive) Referred by: Lorine Bears, NP  Subjective: Chief Complaint  Patient presents with   Lower Back - Pain   HPI:  Tina Murray is a 71 y.o. female who comes in today for planned repeat Bilateral L4-5 and L5-S1 Lumbar facet/medial branch block with fluoroscopic guidance.  The patient has failed conservative care including home exercise, medications, time and activity modification.  This injection will be diagnostic and hopefully therapeutic.  Please see requesting physician notes for further details and justification.  Exam shows concordant low back pain with facet joint loading and extension. Patient received more than 80% pain relief from prior injection. This would be the second block in a diagnostic double block paradigm.     Referring:Megan Jimmye Norman, FNP   ROS Otherwise per HPI.  Assessment & Plan: Visit Diagnoses:    ICD-10-CM   1. Spondylosis without myelopathy or radiculopathy, lumbar region  M47.816 XR C-ARM NO REPORT    Epidural Steroid injection    bupivacaine (MARCAINE) 0.5 % (with pres) injection 3 mL      Plan: No additional findings.   Meds & Orders:  Meds ordered this encounter  Medications   bupivacaine (MARCAINE) 0.5 % (with pres) injection 3 mL    Orders Placed This Encounter  Procedures   XR C-ARM NO REPORT   Epidural Steroid injection    Follow-up: Return for Review Pain Diary.   Procedures: No procedures performed  Lumbar Diagnostic Facet Joint Nerve Block with Fluoroscopic Guidance   Patient: Tina Murray      Date of Birth: 08-08-1951 MRN: [id number] PCP: [name], MD (Inactive)      Visit Date: 04/29/2022   Universal Protocol:    Date/Time: 09/27/235:56 AM  Consent Given By: the patient  Position: PRONE  Additional Comments: Vital signs were monitored before and after  the procedure. Patient was prepped and draped in the usual sterile fashion. The correct patient, procedure, and site was verified.   Injection Procedure Details:   Procedure diagnoses:  1. Spondylosis without myelopathy or radiculopathy, lumbar region      Meds Administered:  Meds ordered this encounter  Medications   bupivacaine (MARCAINE) 0.5 % (with pres) injection 3 mL     Laterality: Bilateral  Location/Site: L4-L5, L3 and L4 medial branches and L5-S1, L4 medial branch and L5 dorsal ramus  Needle: 5.0 in., 25 ga.  Short bevel or Quincke spinal needle  Needle Placement: Oblique pedical  Findings:   -Comments: There was excellent flow of contrast along the articular pillars without intravascular flow.  Procedure Details: The fluoroscope beam is vertically oriented in AP and then obliqued 15 to 20 degrees to the ipsilateral side of the desired nerve to achieve the "Scotty dog" appearance.  The skin over the target area of the junction of the superior articulating process and the transverse process (sacral ala if blocking the L5 dorsal rami) was locally anesthetized with a 1 ml volume of 1% Lidocaine without Epinephrine.  The spinal needle was inserted and advanced in a trajectory view down to the target.   After contact with periosteum and negative aspirate for blood and CSF, correct placement without intravascular or epidural spread was confirmed by injecting 0.5 ml. of Isovue-250.  A spot radiograph was obtained of this image.    Next, a 0.5 ml. volume of the injectate described above  was injected. The needle was then redirected to the other facet joint nerves mentioned above if needed.  Prior to the procedure, the patient was given a Pain Diary which was completed for baseline measurements.  After the procedure, the patient rated their pain every 30 minutes and will continue rating at this frequency for a total of 5 hours.  The patient has been asked to complete the Diary and  return to [place] by mail, fax or hand delivered as soon as possible.   Additional Comments:  The patient tolerated the procedure well Dressing: 2 x 2 sterile gauze and Band-Aid    Post-procedure details: Patient was observed during the procedure. Post-procedure instructions were reviewed.  Patient left the clinic in stable condition.   Clinical History: MRI of the cervical spine  FINDINGS: :  On sagittal images, the spine is imaged from above the cervicomedullary junction to T2.   The spinal cord is of normal caliber and signal.   There is a 3 mm of retrolisthesis of C5 upon C6 associated with severe loss of disc height.  There is 1 to 2 mm of retrolisthesis of C6 upon C7 associated with moderate loss of disc height.  There is also moderate loss of disc height at C4-C5.   The vertebral bodies have normal signal.     The discs and interspaces were further evaluated on axial views from C2 to T1 as follows:   C2-C3: There is mild right facet hypertrophy.  The neural foramen foramina are not narrowed and there is no nerve root compression.   C3-C4: There is right uncovertebral spurring and mild left facet hypertrophy.  There is mild to moderate left foraminal narrowing but no nerve root compression.   C4-C5: The central canal is narrowed but not enough to be considered spinal stenosis.  There is left uncovertebral spurring and left facet hypertrophy.  There is moderately severe left foraminal narrowing with some potential for left C5 nerve root compression   C5-C6: There is mild retrolisthesis of C5 upon C6 and severe right,  moderate left uncovertebral spurring and mild right facet hypertrophy leading to moderate spinal stenosis and right greater than left foraminal narrowing..   The neural foramina are moderately narrowed.    C6-C7: There is minimal spinal stenosis due to disc protrusion, borderline retrolisthesis and mild uncovertebral spurring.  The neuroforamina are mildly to moderately  narrowed but there does not appear to be any nerve root compression.   C7-T1: There is mild disc bulging that does not lead to any spinal stenosis or nerve root compression.  IMPRESSION: This MRI of the cervical spine without contrast shows the following: 1.   The spinal cord appears normal. 2.   At C4-C5, there are degenerative changes more on the left causing moderately severe left foraminal narrowing with some potential for left C5 nerve root compression. 3.   At C5-C6, there is retrolisthesis and other degenerative changes causing moderate spinal stenosis.  Neural foramina are moderately narrowed but there does not appear to be any definite nerve root compression. 4.   At C6-C7, there is minimal spinal stenosis but no nerve root compression. 5.   Milder degenerative changes at C2-C3, C3-C4 and C7-T1 do not lead to any spinal stenosis or nerve root compression.  Richard A. Felecia Shelling, MD, PhD, [facility]     Objective:  VS:  HT:    WT:   BMI:     BP:   HR: bpm  TEMP: ( )  RESP:  Physical Exam Vitals and nursing note reviewed.  Constitutional:      General: She is not in acute distress.    Appearance: Normal appearance. She is not ill-appearing.  HENT:     Head: Normocephalic and atraumatic.     Right Ear: External ear normal.     Left Ear: External ear normal.  Eyes:     Extraocular Movements: Extraocular movements intact.  Cardiovascular:     Rate and Rhythm: Normal rate.     Pulses: Normal pulses.  Pulmonary:     Effort: Pulmonary effort is normal. No respiratory distress.  Abdominal:     General: There is no distension.     Palpations: Abdomen is soft.  Musculoskeletal:        General: Tenderness present.     Cervical back: Neck supple.     Right lower leg: No edema.     Left lower leg: No edema.     Comments: Patient has good distal strength with no pain over the greater trochanters.  No clonus or focal weakness.  Skin:    Findings: No erythema, lesion or rash.   Neurological:     General: No focal deficit present.     Mental Status: She is alert and oriented to person, place, and time.     Sensory: No sensory deficit.     Motor: No weakness or abnormal muscle tone.     Coordination: Coordination normal.  Psychiatric:        Mood and Affect: Mood normal.        Behavior: Behavior normal.      Imaging: No results found.

## 2022-05-13 DIAGNOSIS — E7800 Pure hypercholesterolemia, unspecified: Secondary | ICD-10-CM | POA: Diagnosis not present

## 2022-05-13 DIAGNOSIS — M62838 Other muscle spasm: Secondary | ICD-10-CM | POA: Diagnosis not present

## 2022-05-13 DIAGNOSIS — Z23 Encounter for immunization: Secondary | ICD-10-CM | POA: Diagnosis not present

## 2022-05-13 DIAGNOSIS — R739 Hyperglycemia, unspecified: Secondary | ICD-10-CM | POA: Diagnosis not present

## 2022-05-17 DIAGNOSIS — E7800 Pure hypercholesterolemia, unspecified: Secondary | ICD-10-CM | POA: Diagnosis not present

## 2022-05-17 DIAGNOSIS — R739 Hyperglycemia, unspecified: Secondary | ICD-10-CM | POA: Diagnosis not present

## 2022-05-20 ENCOUNTER — Encounter: Payer: Self-pay | Admitting: Rehabilitative and Restorative Service Providers"

## 2022-05-20 ENCOUNTER — Ambulatory Visit (INDEPENDENT_AMBULATORY_CARE_PROVIDER_SITE_OTHER): Payer: Medicare Other | Admitting: Rehabilitative and Restorative Service Providers"

## 2022-05-20 ENCOUNTER — Other Ambulatory Visit: Payer: Self-pay

## 2022-05-20 DIAGNOSIS — R293 Abnormal posture: Secondary | ICD-10-CM

## 2022-05-20 DIAGNOSIS — M6281 Muscle weakness (generalized): Secondary | ICD-10-CM

## 2022-05-20 DIAGNOSIS — M542 Cervicalgia: Secondary | ICD-10-CM | POA: Diagnosis not present

## 2022-05-20 NOTE — Therapy (Signed)
OUTPATIENT PHYSICAL THERAPY EVALUATION   Patient Name: Tina Murray MRN: [id number] DOB:Jun 13, 1951, 71 y.o., female Today's Date: 05/20/2022  END OF SESSION:    PT End of Session - 05/20/22 1248     Visit Number 1    Number of Visits 20    Date for PT Re-Evaluation 07/29/22    Authorization Type Medicare    Progress Note Due on Visit 10    PT Start Time 1305    PT Stop Time 1342    PT Time Calculation (min) 37 min    Activity Tolerance Patient tolerated treatment well    Behavior During Therapy WFL for tasks assessed/performed             Past Medical History:  Diagnosis Date   DDD (degenerative disc disease), lumbar    GAD (generalized anxiety disorder)    Headache    Hypercholesterolemia    Hyperglycemia    Hyperlipemia    Lumbar radiculopathy    Migraine    Osteoarthritis    Paresthesia of foot    right   Sacroiliac joint pain    Sciatica of right side    Scoliosis    Seasonal allergies    Past Surgical History:  Procedure Laterality Date   OVARIAN CYST REMOVAL Left 1977   Patient Active Problem List   Diagnosis Date Noted   Chronic migraine w/o aura w/o status migrainosus, not intractable 01/10/2021   Anxiety 01/10/2021   Chronic migraine 03/03/2020   Chronic neck pain 03/03/2020   Paresthesia 11/24/2017   Neck pain 11/24/2017    PCP: [name] MD  REFERRING PROVIDER: Lorine Bears, NP  REFERRING DIAG: [phone] (ICD-10-CM) - Chronic neck pain M54.12 (ICD-10-CM) - Radiculopathy, cervical region M79.18 (ICD-10-CM) - Myofascial pain syndrome  THERAPY DIAG:  Cervicalgia  Muscle weakness (generalized)  Abnormal posture  Rationale for Evaluation and Treatment Rehabilitation  ONSET DATE: 05/12/2022 Acute worsening of chronic condition  SUBJECTIVE:                                                                                                                                                                                                          SUBJECTIVE STATEMENT: Pt indicated complaints of neck and shoulder pain complaints c headaches at times (has one today).  Pt indicated having times of doing better and times of doing worse.  Pt indicated last 10 days her neck has been "killing her".  Pt indicated having headaches complaints temporal region bilaterally.  Pt indicated having a desire to improve strength in arms but can  feel some pain related to after doing it.   Pt stated no trouble sleeping due to neck pain.  Denied visual changes or dizziness complaints.    Ablation on back 05/28/2022  PERTINENT HISTORY:  DDD lumbar, hypercholesterolemia, hyperlipidemia, OA, scoliosis.  Cervical MRI imaging from 2019 exhibits multilevel facet hypertrophy. There is also retrolisthesis and moderate spinal canal stenosis at C5-C6. No history of cervical surgery/cervical injections.  PAIN:  NPRS scale: current 10/10 Pain location: Neck pain, shoulder bilaterally Pain description: tightness, throbbing Aggravating factors: static positioning/less active Relieving factors: medicine, massage gun, volaren gel  PRECAUTIONS: None  WEIGHT BEARING RESTRICTIONS No  FALLS:  Has patient fallen in last 6 months? No  LIVING ENVIRONMENT: Lives in: House/apartment   OCCUPATION: retired  PLOF: Independent, volleyball, workouts  PATIENT GOALS ;  Reduce pain, "feel better."  OBJECTIVE:   PATIENT SURVEYS:  05/20/2022 FOTO intake: 59   predicted:  53  COGNITION: 05/20/2022 Overall cognitive status: Within functional limits for tasks assessed  SENSATION: 05/20/2022 WFL in clinic  POSTURE:  05/20/2022  Rounded shoulders, mild forward head posture  PALPATION: 05/20/2022 Upper trap trigger points c concordant local and head related symptoms.    CERVICAL ROM:   Active ROM AROM (deg) 05/20/2022  Flexion 70 no complaints   Extension 48 c neck soreness  Right lateral flexion   Left lateral flexion   Right rotation 60   Left rotation 52   (Blank rows = not tested)  UPPER EXTREMITY ROM:  ROM Right 05/20/2022 Left 05/20/2022  Shoulder flexion    Shoulder extension    Shoulder abduction    Shoulder adduction    Shoulder extension    Shoulder internal rotation    Shoulder external rotation    Elbow flexion    Elbow extension    Wrist flexion    Wrist extension    Wrist ulnar deviation    Wrist radial deviation    Wrist pronation    Wrist supination     (Blank rows = not tested)  UPPER EXTREMITY MMT:  MMT Right 05/20/2022 Left 05/20/2022  Shoulder flexion 4/5 5/5  Shoulder extension    Shoulder abduction 5/5 5/5  Shoulder adduction    Shoulder extension    Shoulder internal rotation 5/5 5/5  Shoulder external rotation 5/5 5/5  Middle trapezius    Lower trapezius    Elbow flexion 5/5 5/5  Elbow extension 5/5 5/5  Wrist flexion    Wrist extension    Wrist ulnar deviation    Wrist radial deviation    Wrist pronation    Wrist supination    Grip strength     (Blank rows = not tested)  CERVICAL SPECIAL TESTS:  05/20/2022 Normal accessory movement within cervical spine.   FUNCTIONAL TESTS:  05/20/2022 None specifically tested today   TODAY'S TREATMENT:  05/20/2022 Therex:    HEP instruction/performance c cues for techniques, handout provided.  Trial set performed of each for comprehension and symptom assessment.  See below for exercise list  Manual Compression to LT and Rt upper trap c movement t  PATIENT EDUCATION:  05/20/2022 Education details: HEP, POC, DN Person educated: Patient Education method: Explanation, Demonstration, Verbal cues, and Handouts Education comprehension: verbalized understanding, returned demonstration, and verbal cues required   HOME EXERCISE PROGRAM: Access Code: [email or web address] URL: [email or web address] Date: 05/20/2022 Prepared by: Scot Jun  Exercises - Supine Cervical Retraction with Towel  - 1-2 x daily - 7 x weekly - 1 sets  - 10 reps - 5  hold - Supine Scapular Retraction  - 2-3 x daily - 7 x weekly - 1 sets - 10 reps - 5 hold - Seated Scapular Retraction  - 2-3 x daily - 7 x weekly - 1 sets - 10 reps - 3-5 hold - Seated Upper Trapezius Stretch  - 2-3 x daily - 7 x weekly - 1 sets - 5 reps - 15 hold  ASSESSMENT:  CLINICAL IMPRESSION: Patient is a 71 y.o. who comes to clinic with complaints of cervical complaints c UE symptoms with mobility, strength and movement coordination deficits that impair their ability to perform usual daily and recreational functional activities without increase difficulty/symptoms at this time.  Patient to benefit from skilled PT services to address impairments and limitations to improve to previous level of function without restriction secondary to condition.    OBJECTIVE IMPAIRMENTS decreased activity tolerance, decreased coordination, decreased mobility, decreased ROM, decreased strength, increased fascial restrictions, impaired perceived functional ability, increased muscle spasms, impaired flexibility, impaired UE functional use, improper body mechanics, postural dysfunction, and pain.   ACTIVITY LIMITATIONS carrying, lifting, sitting, standing, and reach over head  PARTICIPATION LIMITATIONS: interpersonal relationship, driving, shopping, and community activity  PERSONAL FACTORS  DDD lumbar, hypercholesterolemia, hyperlipidemia, OA, scoliosis  are also affecting patient's functional outcome.   REHAB POTENTIAL: Good  CLINICAL DECISION MAKING: Stable/uncomplicated  EVALUATION COMPLEXITY: Low   GOALS: Goals reviewed with patient? Yes  Short term PT Goals (target date for Short term goals are 3 weeks 06/10/2022) Patient will demonstrate independent use of home exercise program to maintain progress from in clinic treatments. Goal status: New   Long term PT goals (target dates for all long term goals are 10 weeks  07/29/2022 )   1. Patient will demonstrate/report pain at worst  less than or equal to 2/10 to facilitate minimal limitation in daily activity secondary to pain symptoms. Goal status: New   2. Patient will demonstrate independent use of home exercise program to facilitate ability to maintain/progress functional gains from skilled physical therapy services. Goal status: New   3. Patient will demonstrate FOTO outcome > or = 67 % to indicate reduced disability due to condition. Goal status: New   4.  Patient will demonstrate cervical AROM WFL s symptoms to facilitate usual head movements for daily activity including driving, self care.   Goal status: New   5.  Patient will demonstrate/report ability to perform usual housework activity, gym workouts at PLOF.    Goal status: New   6.  Patient will demonstrate bilateral shoulder strength 5/5 throughout.   Goal status: New      PLAN: PT FREQUENCY: 1-2x/week  PT DURATION: 10 weeks  PLANNED INTERVENTIONS:  Therapeutic exercises, Therapeutic activity, Neuro Muscular re-education, Balance training, Gait training, Patient/Family education, Joint mobilization, Stair training, DME instructions, Dry Needling, Electrical stimulation, Cryotherapy, Traction Moist heat, Taping, Ultrasound, Ionotophoresis '4mg'$ /ml Dexamethasone, and Manual therapy.  All included unless contraindicated  PLAN FOR NEXT SESSION:  DN as desired for upper traps.   Scot Jun, PT, DPT, OCS, ATC 05/20/22  2:14 PM

## 2022-05-22 DIAGNOSIS — M7989 Other specified soft tissue disorders: Secondary | ICD-10-CM | POA: Diagnosis not present

## 2022-05-22 DIAGNOSIS — I872 Venous insufficiency (chronic) (peripheral): Secondary | ICD-10-CM | POA: Diagnosis not present

## 2022-05-22 DIAGNOSIS — R252 Cramp and spasm: Secondary | ICD-10-CM | POA: Diagnosis not present

## 2022-05-22 DIAGNOSIS — I83813 Varicose veins of bilateral lower extremities with pain: Secondary | ICD-10-CM | POA: Diagnosis not present

## 2022-05-28 ENCOUNTER — Ambulatory Visit: Payer: Self-pay

## 2022-05-28 ENCOUNTER — Ambulatory Visit (INDEPENDENT_AMBULATORY_CARE_PROVIDER_SITE_OTHER): Payer: Medicare Other | Admitting: Physical Medicine and Rehabilitation

## 2022-05-28 VITALS — BP 138/80 | HR 71

## 2022-05-28 DIAGNOSIS — M47816 Spondylosis without myelopathy or radiculopathy, lumbar region: Secondary | ICD-10-CM | POA: Diagnosis not present

## 2022-05-28 MED ORDER — BUPIVACAINE HCL 0.5 % IJ SOLN
3.0000 mL | Freq: Once | INTRAMUSCULAR | Status: AC
  Administered 2022-05-28: 3 mL

## 2022-05-28 NOTE — Procedures (Signed)
Lumbar Facet Joint Nerve Denervation  Patient: Tina Murray      Date of Birth: 03-Jun-1951 MRN: [id number] PCP: [name], MD (Inactive)      Visit Date: 05/28/2022   Universal Protocol:    Date/Time: 10/17/235:21 PM  Consent Given By: the patient  Position: PRONE  Additional Comments: Vital signs were monitored before and after the procedure. Patient was prepped and draped in the usual sterile fashion. The correct patient, procedure, and site was verified.   Injection Procedure Details:   Procedure diagnoses:  1. Spondylosis without myelopathy or radiculopathy, lumbar region      Meds Administered:  Meds ordered this encounter  Medications   bupivacaine (MARCAINE) 0.5 % (with pres) injection 3 mL     Laterality: Left  Location/Site:  L4-L5, L3 and L4 medial branches and L5-S1, L4 medial branch and L5 dorsal ramus  Needle: 18 ga.,  25m active tip, 1013mRF Cannula  Needle Placement: Along juncture of superior articular process and transverse pocess  Findings:  -Comments:  Procedure Details: For each desired target nerve, the corresponding transverse process (sacral ala for the L5 dorsal rami) was identified and the fluoroscope was positioned to square off the endplates of the corresponding vertebral body to achieve a true AP midline view.  The beam was then obliqued 15 to 20 degrees and caudally tilted 15 to 20 degrees to line up a trajectory along the target nerves. The skin over the target of the junction of superior articulating process and transverse process (sacral ala for the L5 dorsal rami) was infiltrated with 73m44mf 1% Lidocaine without Epinephrine.  The 18 gauge 31m77mtive tip outer cannula was advanced in trajectory view to the target.  This procedure was repeated for each target nerve.  Then, for all levels, the outer cannula placement was fine-tuned and the position was then confirmed with bi-planar imaging.    Test stimulation was done both at  sensory and motor levels to ensure there was no radicular stimulation. The target tissues were then infiltrated with 1 ml of 1% Lidocaine without Epinephrine. Subsequently, a percutaneous neurotomy was carried out for 90 seconds at 80 degrees Celsius.  After the completion of the lesion, 1 ml of injectate was delivered. It was then repeated for each facet joint nerve mentioned above. Appropriate radiographs were obtained to verify the probe placement during the neurotomy.   Additional Comments:  The patient tolerated the procedure well Dressing: 2 x 2 sterile gauze and Band-Aid    Post-procedure details: Patient was observed during the procedure. Post-procedure instructions were reviewed.  Patient left the clinic in stable condition.

## 2022-05-28 NOTE — Progress Notes (Signed)
Tina Murray - 71 y.o. female MRN [id number]  Date of birth: 1951/08/06  Office Visit Note: Visit Date: 05/28/2022 PCP: [name], MD (Inactive) Referred by: Lorine Bears, NP  Subjective: Chief Complaint  Patient presents with   Lower Back - Pain   HPI:  Tina Murray is a 71 y.o. female who comes in todayfor planned radiofrequency ablation of the Left L4-5 and L5-S1 Lumbar facet joints. This would be ablation of the corresponding medial branches and/or dorsal rami.  Patient has had double diagnostic blocks with more than 50% relief.  These are documented on pain diary.  They have had chronic back pain for quite some time, more than 3 months, which has been an ongoing situation with recalcitrant axial back pain.  They have no radicular pain.  Their axial pain is worse with standing and ambulating and on exam today with facet loading.  They have had physical therapy as well as home exercise program.  The imaging noted in the chart below indicated facet pathology. Accordingly they meet all the criteria and qualification for for radiofrequency ablation and we are going to complete this today hopefully for more longer term relief as part of comprehensive management program.   ROS Otherwise per HPI.  Assessment & Plan: Visit Diagnoses:    ICD-10-CM   1. Spondylosis without myelopathy or radiculopathy, lumbar region  M47.816 XR C-ARM NO REPORT    Radiofrequency,Lumbar    bupivacaine (MARCAINE) 0.5 % (with pres) injection 3 mL      Plan: No additional findings.   Meds & Orders:  Meds ordered this encounter  Medications   bupivacaine (MARCAINE) 0.5 % (with pres) injection 3 mL    Orders Placed This Encounter  Procedures   Radiofrequency,Lumbar   XR C-ARM NO REPORT    Follow-up: Return if symptoms worsen or fail to improve.   Procedures: No procedures performed  Lumbar Facet Joint Nerve Denervation  Patient: Tina Murray      Date of Birth: Apr 13, 1951 MRN:  [id number] PCP: [name], MD (Inactive)      Visit Date: 05/28/2022   Universal Protocol:    Date/Time: 10/17/235:21 PM  Consent Given By: the patient  Position: PRONE  Additional Comments: Vital signs were monitored before and after the procedure. Patient was prepped and draped in the usual sterile fashion. The correct patient, procedure, and site was verified.   Injection Procedure Details:   Procedure diagnoses:  1. Spondylosis without myelopathy or radiculopathy, lumbar region      Meds Administered:  Meds ordered this encounter  Medications   bupivacaine (MARCAINE) 0.5 % (with pres) injection 3 mL     Laterality: Left  Location/Site:  L4-L5, L3 and L4 medial branches and L5-S1, L4 medial branch and L5 dorsal ramus  Needle: 18 ga.,  22m active tip, [id number] Cannula  Needle Placement: Along juncture of superior articular process and transverse pocess  Findings:  -Comments:  Procedure Details: For each desired target nerve, the corresponding transverse process (sacral ala for the L5 dorsal rami) was identified and the fluoroscope was positioned to square off the endplates of the corresponding vertebral body to achieve a true AP midline view.  The beam was then obliqued 15 to 20 degrees and caudally tilted 15 to 20 degrees to line up a trajectory along the target nerves. The skin over the target of the junction of superior articulating process and transverse process (sacral ala for the L5 dorsal rami) was infiltrated with [id number]  1% Lidocaine without Epinephrine.  The 18 gauge 72m active tip outer cannula was advanced in trajectory view to the target.  This procedure was repeated for each target nerve.  Then, for all levels, the outer cannula placement was fine-tuned and the position was then confirmed with bi-planar imaging.    Test stimulation was done both at sensory and motor levels to ensure there was no radicular stimulation. The target tissues were then  infiltrated with 1 ml of 1% Lidocaine without Epinephrine. Subsequently, a percutaneous neurotomy was carried out for 90 seconds at 80 degrees Celsius.  After the completion of the lesion, 1 ml of injectate was delivered. It was then repeated for each facet joint nerve mentioned above. Appropriate radiographs were obtained to verify the probe placement during the neurotomy.   Additional Comments:  The patient tolerated the procedure well Dressing: 2 x 2 sterile gauze and Band-Aid    Post-procedure details: Patient was observed during the procedure. Post-procedure instructions were reviewed.  Patient left the clinic in stable condition.      Clinical History: MRI of the cervical spine  FINDINGS: :  On sagittal images, the spine is imaged from above the cervicomedullary junction to T2.   The spinal cord is of normal caliber and signal.   There is a 3 mm of retrolisthesis of C5 upon C6 associated with severe loss of disc height.  There is 1 to 2 mm of retrolisthesis of C6 upon C7 associated with moderate loss of disc height.  There is also moderate loss of disc height at C4-C5.   The vertebral bodies have normal signal.     The discs and interspaces were further evaluated on axial views from C2 to T1 as follows:   C2-C3: There is mild right facet hypertrophy.  The neural foramen foramina are not narrowed and there is no nerve root compression.   C3-C4: There is right uncovertebral spurring and mild left facet hypertrophy.  There is mild to moderate left foraminal narrowing but no nerve root compression.   C4-C5: The central canal is narrowed but not enough to be considered spinal stenosis.  There is left uncovertebral spurring and left facet hypertrophy.  There is moderately severe left foraminal narrowing with some potential for left C5 nerve root compression   C5-C6: There is mild retrolisthesis of C5 upon C6 and severe right,  moderate left uncovertebral spurring and mild right facet  hypertrophy leading to moderate spinal stenosis and right greater than left foraminal narrowing..   The neural foramina are moderately narrowed.    C6-C7: There is minimal spinal stenosis due to disc protrusion, borderline retrolisthesis and mild uncovertebral spurring.  The neuroforamina are mildly to moderately narrowed but there does not appear to be any nerve root compression.   C7-T1: There is mild disc bulging that does not lead to any spinal stenosis or nerve root compression.  IMPRESSION: This MRI of the cervical spine without contrast shows the following: 1.   The spinal cord appears normal. 2.   At C4-C5, there are degenerative changes more on the left causing moderately severe left foraminal narrowing with some potential for left C5 nerve root compression. 3.   At C5-C6, there is retrolisthesis and other degenerative changes causing moderate spinal stenosis.  Neural foramina are moderately narrowed but there does not appear to be any definite nerve root compression. 4.   At C6-C7, there is minimal spinal stenosis but no nerve root compression. 5.   Milder degenerative changes at C2-C3,  C3-C4 and C7-T1 do not lead to any spinal stenosis or nerve root compression.  Richard A. Felecia Shelling, MD, PhD, Charlynn Grimes     Objective:  VS:  HT:    WT:   BMI:     BP:138/80  HR:71bpm  TEMP: ( )  RESP:  Physical Exam Vitals and nursing note reviewed.  Constitutional:      General: She is not in acute distress.    Appearance: Normal appearance. She is not ill-appearing.  HENT:     Head: Normocephalic and atraumatic.     Right Ear: External ear normal.     Left Ear: External ear normal.  Eyes:     Extraocular Movements: Extraocular movements intact.  Cardiovascular:     Rate and Rhythm: Normal rate.     Pulses: Normal pulses.  Pulmonary:     Effort: Pulmonary effort is normal. No respiratory distress.  Abdominal:     General: There is no distension.     Palpations: Abdomen is soft.   Musculoskeletal:        General: Tenderness present.     Cervical back: Neck supple.     Right lower leg: No edema.     Left lower leg: No edema.     Comments: Patient has good distal strength with no pain over the greater trochanters.  No clonus or focal weakness.  Skin:    Findings: No erythema, lesion or rash.  Neurological:     General: No focal deficit present.     Mental Status: She is alert and oriented to person, place, and time.     Sensory: No sensory deficit.     Motor: No weakness or abnormal muscle tone.     Coordination: Coordination normal.  Psychiatric:        Mood and Affect: Mood normal.        Behavior: Behavior normal.      Imaging: No results found.

## 2022-05-28 NOTE — Patient Instructions (Signed)

## 2022-05-28 NOTE — Progress Notes (Signed)
Numeric Pain Rating Scale and Functional Assessment Average Pain 3   In the last MONTH (on 0-10 scale) has pain interfered with the following?  1. General activity like being  able to carry out your everyday physical activities such as walking, climbing stairs, carrying groceries, or moving a chair?  Rating( varies )   +Driver, -BT, -Dye Allergies.  Bilateral lower back pain that radiates to buttocks and groin. Pain is worse in the morning. Celebrex and Tylenol for pain

## 2022-06-03 ENCOUNTER — Telehealth: Payer: Self-pay | Admitting: Physical Medicine and Rehabilitation

## 2022-06-03 ENCOUNTER — Encounter: Payer: Medicare Other | Admitting: Rehabilitative and Restorative Service Providers"

## 2022-06-03 NOTE — Telephone Encounter (Signed)
Patient called. She would like to keep her appointment  with Dr. Ernestina Patches.

## 2022-06-03 NOTE — Telephone Encounter (Signed)
IC advised should keep appt

## 2022-06-05 ENCOUNTER — Ambulatory Visit (INDEPENDENT_AMBULATORY_CARE_PROVIDER_SITE_OTHER): Payer: Medicare Other | Admitting: Physical Medicine and Rehabilitation

## 2022-06-05 ENCOUNTER — Ambulatory Visit: Payer: Self-pay

## 2022-06-05 VITALS — BP 125/79 | HR 70

## 2022-06-05 DIAGNOSIS — M47816 Spondylosis without myelopathy or radiculopathy, lumbar region: Secondary | ICD-10-CM | POA: Diagnosis not present

## 2022-06-05 MED ORDER — BUPIVACAINE HCL 0.25 % IJ SOLN
2.0000 mL | Freq: Once | INTRAMUSCULAR | Status: AC
  Administered 2022-06-05: 2 mL

## 2022-06-05 NOTE — Patient Instructions (Signed)

## 2022-06-05 NOTE — Progress Notes (Signed)
Numeric Pain Rating Scale and Functional Assessment Average Pain 0 No pain today.  In the last MONTH (on 0-10 scale) has pain interfered with the following?  1. General activity like being  able to carry out your everyday physical activities such as walking, climbing stairs, carrying groceries, or moving a chair?  Rating(0)   +Driver, -BT, -Dye Allergies.

## 2022-06-06 ENCOUNTER — Ambulatory Visit (INDEPENDENT_AMBULATORY_CARE_PROVIDER_SITE_OTHER): Payer: Medicare Other | Admitting: Podiatry

## 2022-06-06 ENCOUNTER — Ambulatory Visit (INDEPENDENT_AMBULATORY_CARE_PROVIDER_SITE_OTHER): Payer: Medicare Other

## 2022-06-06 ENCOUNTER — Encounter: Payer: Self-pay | Admitting: Podiatry

## 2022-06-06 DIAGNOSIS — S99922A Unspecified injury of left foot, initial encounter: Secondary | ICD-10-CM

## 2022-06-06 DIAGNOSIS — M21619 Bunion of unspecified foot: Secondary | ICD-10-CM | POA: Diagnosis not present

## 2022-06-06 DIAGNOSIS — M2041 Other hammer toe(s) (acquired), right foot: Secondary | ICD-10-CM | POA: Diagnosis not present

## 2022-06-06 DIAGNOSIS — M84375A Stress fracture, left foot, initial encounter for fracture: Secondary | ICD-10-CM | POA: Diagnosis not present

## 2022-06-07 NOTE — Progress Notes (Signed)
Subjective:   Patient ID: Tina Murray, female   DOB: 71 y.o.   MRN: [id number]   HPI Patient presents with 4-day history of pain on top of the left foot and states that its been sore and swollen and she is not sure what may have happened.  She did have history of stress fracture in the past   ROS      Objective:  Physical Exam  Neuro vascular status intact negative Bevelyn Buckles' sign was noted with inflammation pain of the third and fourth metatarsal shaft left that is localized with no proximal pathology or other issues occurring with swelling around the area     Assessment:  Probability for stress fracture of the third metatarsal left within the proximal shaft     Plan:  H&P reviewed all different conditions reviewed x-rays and compared them to 3 years ago discussed treatment and at this point due to the intensity of discomfort and inability to bear weight properly I did go ahead I placed her in an air fracture walker to immobilize and prevent the fracture from getting worse.  I did educate her on how to wear it properly and it was fitted properly to her lower leg.  Also discussed her bunion do not recommend treatment but she does understand at 1 point future may need to be corrected.  Reappoint 3 weeks  X-rays indicate suspicion around the third metatarsal shaft it is early so we cannot make complete determination

## 2022-06-10 ENCOUNTER — Ambulatory Visit (INDEPENDENT_AMBULATORY_CARE_PROVIDER_SITE_OTHER): Payer: Medicare Other | Admitting: Rehabilitative and Restorative Service Providers"

## 2022-06-10 ENCOUNTER — Encounter: Payer: Medicare Other | Admitting: Rehabilitative and Restorative Service Providers"

## 2022-06-10 ENCOUNTER — Encounter: Payer: Self-pay | Admitting: Rehabilitative and Restorative Service Providers"

## 2022-06-10 DIAGNOSIS — R293 Abnormal posture: Secondary | ICD-10-CM | POA: Diagnosis not present

## 2022-06-10 DIAGNOSIS — M6281 Muscle weakness (generalized): Secondary | ICD-10-CM | POA: Diagnosis not present

## 2022-06-10 DIAGNOSIS — M542 Cervicalgia: Secondary | ICD-10-CM | POA: Diagnosis not present

## 2022-06-10 NOTE — Therapy (Signed)
OUTPATIENT PHYSICAL THERAPY TREATMENT   Patient Name: Tina Murray MRN: [id number] DOB:29-Sep-1950, 71 y.o., female Today's Date: 06/10/2022  PCP: [name] MD  REFERRING PROVIDER: Lorine Bears, NP  END OF SESSION:    PT End of Session - 06/10/22 1150     Visit Number 2    Number of Visits 20    Date for PT Re-Evaluation 07/29/22    Authorization Type Medicare    Progress Note Due on Visit 10    PT Start Time 1132    PT Stop Time 1210    PT Time Calculation (min) 38 min    Activity Tolerance Patient tolerated treatment well    Behavior During Therapy WFL for tasks assessed/performed              Past Medical History:  Diagnosis Date   DDD (degenerative disc disease), lumbar    GAD (generalized anxiety disorder)    Headache    Hypercholesterolemia    Hyperglycemia    Hyperlipemia    Lumbar radiculopathy    Migraine    Osteoarthritis    Paresthesia of foot    right   Sacroiliac joint pain    Sciatica of right side    Scoliosis    Seasonal allergies    Past Surgical History:  Procedure Laterality Date   OVARIAN CYST REMOVAL Left 1977   Patient Active Problem List   Diagnosis Date Noted   Chronic migraine w/o aura w/o status migrainosus, not intractable 01/10/2021   Anxiety 01/10/2021   Chronic migraine 03/03/2020   Chronic neck pain 03/03/2020   Paresthesia 11/24/2017   Neck pain 11/24/2017   REFERRING DIAG: M54.2,G89.29 (ICD-10-CM) - Chronic neck pain M54.12 (ICD-10-CM) - Radiculopathy, cervical region M79.18 (ICD-10-CM) - Myofascial pain syndrome  THERAPY DIAG:  Cervicalgia  Muscle weakness (generalized)  Abnormal posture  Rationale for Evaluation and Treatment Rehabilitation  ONSET DATE: 05/12/2022 Acute worsening of chronic condition  SUBJECTIVE:                                                                                                                                                                                                          SUBJECTIVE STATEMENT: Pt indicated feeling ready to work on her neck.    PERTINENT HISTORY:  DDD lumbar, hypercholesterolemia, hyperlipidemia, OA, scoliosis.  Cervical MRI imaging from 2019 exhibits multilevel facet hypertrophy. There is also retrolisthesis and moderate spinal canal stenosis at C5-C6. No history of cervical surgery/cervical injections.  PAIN:  NPRS scale: current 10/10 Pain location: Neck pain, shoulder bilaterally Pain description:  tightness, throbbing Aggravating factors: static positioning/less active Relieving factors: medicine, massage gun, volaren gel  PRECAUTIONS: None  WEIGHT BEARING RESTRICTIONS No  FALLS:  Has patient fallen in last 6 months? No  LIVING ENVIRONMENT: Lives in: House/apartment   OCCUPATION: retired  PLOF: Independent, volleyball, workouts  PATIENT GOALS ;  Reduce pain, "feel better."  OBJECTIVE:   PATIENT SURVEYS:  05/20/2022 FOTO intake: 59   predicted:  31  COGNITION: 05/20/2022 Overall cognitive status: Within functional limits for tasks assessed  SENSATION: 05/20/2022 WFL in clinic  POSTURE:  05/20/2022  Rounded shoulders, mild forward head posture  PALPATION: 05/20/2022 Upper trap trigger points c concordant local and head related symptoms.    CERVICAL ROM:   Active ROM AROM (deg) 05/20/2022 AROM 06/10/2022  Flexion 70 no complaints  70  Extension 48 c neck soreness 71  Right lateral flexion    Left lateral flexion    Right rotation 60 62  Left rotation 52 62   (Blank rows = not tested)  UPPER EXTREMITY ROM:  ROM Right 05/20/2022 Left 05/20/2022  Shoulder flexion    Shoulder extension    Shoulder abduction    Shoulder adduction    Shoulder extension    Shoulder internal rotation    Shoulder external rotation    Elbow flexion    Elbow extension    Wrist flexion    Wrist extension    Wrist ulnar deviation    Wrist radial deviation    Wrist pronation    Wrist supination      (Blank rows = not tested)  UPPER EXTREMITY MMT:  MMT Right 05/20/2022 Left 05/20/2022  Shoulder flexion 4/5 5/5  Shoulder extension    Shoulder abduction 5/5 5/5  Shoulder adduction    Shoulder extension    Shoulder internal rotation 5/5 5/5  Shoulder external rotation 5/5 5/5  Middle trapezius    Lower trapezius    Elbow flexion 5/5 5/5  Elbow extension 5/5 5/5  Wrist flexion    Wrist extension    Wrist ulnar deviation    Wrist radial deviation    Wrist pronation    Wrist supination    Grip strength     (Blank rows = not tested)  CERVICAL SPECIAL TESTS:  05/20/2022 Normal accessory movement within cervical spine.   FUNCTIONAL TESTS:  05/20/2022 None specifically tested today   TODAY'S TREATMENT:  06/10/2022 Manual:  compression to bilateral upper trap.  Regional PA mob g5 to mid thoracic x 2 (no cavitation noted)   Trigger Point Dry-Needling  Treatment instructions: Expect mild to moderate muscle soreness. S/S of pneumothorax if dry needled over a lung field, and to seek immediate medical attention should they occur. Patient verbalized understanding of these instructions and education.  Patient Consent Given: Yes Education handout provided: Previously provided Muscles treated: Bilateral upper trap  Treatment response/outcome: No adverse reactions   Therex: Moist heat bilateral neck while performing upper trap stretch Review of existing HEP for knowledge Tband rows green x 15 Tband gh ext green x 15   05/20/2022 Therex:    HEP instruction/performance c cues for techniques, handout provided.  Trial set performed of each for comprehension and symptom assessment.  See below for exercise list  Manual Compression to LT and Rt upper trap c movement  PATIENT EDUCATION:  05/20/2022 Education details: HEP, POC, DN Person educated: Patient Education method: Explanation, Demonstration, Verbal cues, and Handouts Education comprehension: verbalized understanding,  returned demonstration, and verbal cues required   HOME EXERCISE PROGRAM:  Access Code: [email or web address] URL: [email or web address] Date: 05/20/2022 Prepared by: Scot Jun  Exercises - Supine Cervical Retraction with Towel  - 1-2 x daily - 7 x weekly - 1 sets - 10 reps - 5 hold - Supine Scapular Retraction  - 2-3 x daily - 7 x weekly - 1 sets - 10 reps - 5 hold - Seated Scapular Retraction  - 2-3 x daily - 7 x weekly - 1 sets - 10 reps - 3-5 hold - Seated Upper Trapezius Stretch  - 2-3 x daily - 7 x weekly - 1 sets - 5 reps - 15 hold  ASSESSMENT:  CLINICAL IMPRESSION: Positive tolerance to dry needling and manual intervention today.  Cervical range improved, specifically for extension today vs. Evaluation.  Continued skilled PT services warranted at this time.    OBJECTIVE IMPAIRMENTS decreased activity tolerance, decreased coordination, decreased mobility, decreased ROM, decreased strength, increased fascial restrictions, impaired perceived functional ability, increased muscle spasms, impaired flexibility, impaired UE functional use, improper body mechanics, postural dysfunction, and pain.   ACTIVITY LIMITATIONS carrying, lifting, sitting, standing, and reach over head  PARTICIPATION LIMITATIONS: interpersonal relationship, driving, shopping, and community activity  PERSONAL FACTORS  DDD lumbar, hypercholesterolemia, hyperlipidemia, OA, scoliosis  are also affecting patient's functional outcome.   REHAB POTENTIAL: Good  CLINICAL DECISION MAKING: Stable/uncomplicated  EVALUATION COMPLEXITY: Low   GOALS: Goals reviewed with patient? Yes  Short term PT Goals (target date for Short term goals are 3 weeks 06/10/2022) Patient will demonstrate independent use of home exercise program to maintain progress from in clinic treatments. Goal status: Met 06/10/2022   Long term PT goals (target dates for all long term goals are 10 weeks  07/29/2022 )   1. Patient will  demonstrate/report pain at worst less than or equal to 2/10 to facilitate minimal limitation in daily activity secondary to pain symptoms. Goal status: on going - 06/10/2022   2. Patient will demonstrate independent use of home exercise program to facilitate ability to maintain/progress functional gains from skilled physical therapy services. Goal status: on going - 06/10/2022   3. Patient will demonstrate FOTO outcome > or = 67 % to indicate reduced disability due to condition. Goal status: on going - 06/10/2022   4.  Patient will demonstrate cervical AROM WFL s symptoms to facilitate usual head movements for daily activity including driving, self care.   Goal status: on going - 06/10/2022   5.  Patient will demonstrate/report ability to perform usual housework activity, gym workouts at PLO[facility].    Goal status: on going - 06/10/2022   6.  Patient will demonstrate bilateral shoulder strength 5/5 throughout.   Goal status: on going - 06/10/2022      PLAN: PT FREQUENCY: 1-2x/week  PT DURATION: 10 weeks  PLANNED INTERVENTIONS:  Therapeutic exercises, Therapeutic activity, Neuro Muscular re-education, Balance training, Gait training, Patient/Family education, Joint mobilization, Stair training, DME instructions, Dry Needling, Electrical stimulation, Cryotherapy, Traction Moist heat, Taping, Ultrasound, Ionotophoresis 13m/ml Dexamethasone, and Manual therapy.  All included unless contraindicated  PLAN FOR NEXT SESSION:  DN as desired for upper traps and postural strengthening.   MScot Jun PT, DPT, OCS, ATC 06/10/22  12:15 PM

## 2022-06-12 NOTE — Progress Notes (Signed)
Tina Murray - 71 y.o. female MRN [id number]  Date of birth: 04-Aug-1951  Office Visit Note: Visit Date: 06/05/2022 PCP: Lurline Del, DO Referred by: Lurline Del, DO  Subjective: Chief Complaint  Patient presents with   Lower Back - Pain   HPI:  Tina Murray is a 70 y.o. female who comes in todayfor planned radiofrequency ablation of the Right L4-5 and L5-S1 Lumbar facet joints. This would be ablation of the corresponding medial branches and/or dorsal rami.  Patient has had double diagnostic blocks with more than 50% relief.  These are documented on pain diary.  They have had chronic back pain for quite some time, more than 3 months, which has been an ongoing situation with recalcitrant axial back pain.  They have no radicular pain.  Their axial pain is worse with standing and ambulating and on exam today with facet loading.  They have had physical therapy as well as home exercise program.  The imaging noted in the chart below indicated facet pathology. Accordingly they meet all the criteria and qualification for for radiofrequency ablation and we are going to complete this today hopefully for more longer term relief as part of comprehensive management program.   ROS Otherwise per HPI.  Assessment & Plan: Visit Diagnoses:    ICD-10-CM   1. Spondylosis without myelopathy or radiculopathy, lumbar region  M47.816 XR C-ARM NO REPORT    Radiofrequency,Lumbar    bupivacaine (MARCAINE) 0.25 % (with pres) injection 2 mL      Plan: No additional findings.   Meds & Orders:  Meds ordered this encounter  Medications   bupivacaine (MARCAINE) 0.25 % (with pres) injection 2 mL    Orders Placed This Encounter  Procedures   Radiofrequency,Lumbar   XR C-ARM NO REPORT    Follow-up: Return for visit to requesting provider as needed.   Procedures: No procedures performed  Lumbar Facet Joint Nerve Denervation  Patient: Tina Murray      Date of Birth: 12-09-1950 MRN: [id number] PCP:  Lurline Del, DO      Visit Date: 06/05/2022   Universal Protocol:    Date/Time: 11/01/235:17 AM  Consent Given By: the patient  Position: PRONE  Additional Comments: Vital signs were monitored before and after the procedure. Patient was prepped and draped in the usual sterile fashion. The correct patient, procedure, and site was verified.   Injection Procedure Details:   Procedure diagnoses:  1. Spondylosis without myelopathy or radiculopathy, lumbar region      Meds Administered:  Meds ordered this encounter  Medications   bupivacaine (MARCAINE) 0.25 % (with pres) injection 2 mL     Laterality: Right  Location/Site:  L4-L5, L3 and L4 medial branches and L5-S1, L4 medial branch and L5 dorsal ramus  Needle: 18 ga.,  84m active tip, 1067mRF Cannula  Needle Placement: Along juncture of superior articular process and transverse pocess  Findings:  -Comments:  Procedure Details: For each desired target nerve, the corresponding transverse process (sacral ala for the L5 dorsal rami) was identified and the fluoroscope was positioned to square off the endplates of the corresponding vertebral body to achieve a true AP midline view.  The beam was then obliqued 15 to 20 degrees and caudally tilted 15 to 20 degrees to line up a trajectory along the target nerves. The skin over the target of the junction of superior articulating process and transverse process (sacral ala for the L5 dorsal rami) was infiltrated with 20m79mf 1% Lidocaine without Epinephrine.  The 18 gauge 51m active tip outer cannula was advanced in trajectory view to the target.  This procedure was repeated for each target nerve.  Then, for all levels, the outer cannula placement was fine-tuned and the position was then confirmed with bi-planar imaging.    Test stimulation was done both at sensory and motor levels to ensure there was no radicular stimulation. The target tissues were then infiltrated with 1 ml of 1%  Lidocaine without Epinephrine. Subsequently, a percutaneous neurotomy was carried out for 90 seconds at 80 degrees Celsius.  After the completion of the lesion, 1 ml of injectate was delivered. It was then repeated for each facet joint nerve mentioned above. Appropriate radiographs were obtained to verify the probe placement during the neurotomy.   Additional Comments:  The patient tolerated the procedure well Dressing: 2 x 2 sterile gauze and Band-Aid    Post-procedure details: Patient was observed during the procedure. Post-procedure instructions were reviewed.  Patient left the clinic in stable condition.      Clinical History: MRI of the cervical spine  FINDINGS: :  On sagittal images, the spine is imaged from above the cervicomedullary junction to T2.   The spinal cord is of normal caliber and signal.   There is a 3 mm of retrolisthesis of C5 upon C6 associated with severe loss of disc height.  There is 1 to 2 mm of retrolisthesis of C6 upon C7 associated with moderate loss of disc height.  There is also moderate loss of disc height at C4-C5.   The vertebral bodies have normal signal.     The discs and interspaces were further evaluated on axial views from C2 to T1 as follows:   C2-C3: There is mild right facet hypertrophy.  The neural foramen foramina are not narrowed and there is no nerve root compression.   C3-C4: There is right uncovertebral spurring and mild left facet hypertrophy.  There is mild to moderate left foraminal narrowing but no nerve root compression.   C4-C5: The central canal is narrowed but not enough to be considered spinal stenosis.  There is left uncovertebral spurring and left facet hypertrophy.  There is moderately severe left foraminal narrowing with some potential for left C5 nerve root compression   C5-C6: There is mild retrolisthesis of C5 upon C6 and severe right,  moderate left uncovertebral spurring and mild right facet hypertrophy leading to  moderate spinal stenosis and right greater than left foraminal narrowing..   The neural foramina are moderately narrowed.    C6-C7: There is minimal spinal stenosis due to disc protrusion, borderline retrolisthesis and mild uncovertebral spurring.  The neuroforamina are mildly to moderately narrowed but there does not appear to be any nerve root compression.   C7-T1: There is mild disc bulging that does not lead to any spinal stenosis or nerve root compression.  IMPRESSION: This MRI of the cervical spine without contrast shows the following: 1.   The spinal cord appears normal. 2.   At C4-C5, there are degenerative changes more on the left causing moderately severe left foraminal narrowing with some potential for left C5 nerve root compression. 3.   At C5-C6, there is retrolisthesis and other degenerative changes causing moderate spinal stenosis.  Neural foramina are moderately narrowed but there does not appear to be any definite nerve root compression. 4.   At C6-C7, there is minimal spinal stenosis but no nerve root compression. 5.   Milder degenerative changes at C2-C3, C3-C4 and C7-T1 do not  lead to any spinal stenosis or nerve root compression.  Richard A. Felecia Shelling, MD, PhD, [facility]     Objective:  VS:  HT:    WT:   BMI:     BP:125/79  HR:70bpm  TEMP: ( )  RESP:  Physical Exam Vitals and nursing note reviewed.  Constitutional:      General: She is not in acute distress.    Appearance: Normal appearance. She is not ill-appearing.  HENT:     Head: Normocephalic and atraumatic.     Right Ear: External ear normal.     Left Ear: External ear normal.  Eyes:     Extraocular Movements: Extraocular movements intact.  Cardiovascular:     Rate and Rhythm: Normal rate.     Pulses: Normal pulses.  Pulmonary:     Effort: Pulmonary effort is normal. No respiratory distress.  Abdominal:     General: There is no distension.     Palpations: Abdomen is soft.  Musculoskeletal:        General:  Tenderness present.     Cervical back: Neck supple.     Right lower leg: No edema.     Left lower leg: No edema.     Comments: Patient has good distal strength with no pain over the greater trochanters.  No clonus or focal weakness.  Skin:    Findings: No erythema, lesion or rash.  Neurological:     General: No focal deficit present.     Mental Status: She is alert and oriented to person, place, and time.     Sensory: No sensory deficit.     Motor: No weakness or abnormal muscle tone.     Coordination: Coordination normal.  Psychiatric:        Mood and Affect: Mood normal.        Behavior: Behavior normal.      Imaging: No results found.

## 2022-06-12 NOTE — Procedures (Signed)
Lumbar Facet Joint Nerve Denervation  Patient: Tina Murray      Date of Birth: 10-21-1950 MRN: [id number] PCP: Lurline Del, DO      Visit Date: 06/05/2022   Universal Protocol:    Date/Time: 11/01/235:17 AM  Consent Given By: the patient  Position: PRONE  Additional Comments: Vital signs were monitored before and after the procedure. Patient was prepped and draped in the usual sterile fashion. The correct patient, procedure, and site was verified.   Injection Procedure Details:   Procedure diagnoses:  1. Spondylosis without myelopathy or radiculopathy, lumbar region      Meds Administered:  Meds ordered this encounter  Medications   bupivacaine (MARCAINE) 0.25 % (with pres) injection 2 mL     Laterality: Right  Location/Site:  L4-L5, L3 and L4 medial branches and L5-S1, L4 medial branch and L5 dorsal ramus  Needle: 18 ga.,  63m active tip, 1089mRF Cannula  Needle Placement: Along juncture of superior articular process and transverse pocess  Findings:  -Comments:  Procedure Details: For each desired target nerve, the corresponding transverse process (sacral ala for the L5 dorsal rami) was identified and the fluoroscope was positioned to square off the endplates of the corresponding vertebral body to achieve a true AP midline view.  The beam was then obliqued 15 to 20 degrees and caudally tilted 15 to 20 degrees to line up a trajectory along the target nerves. The skin over the target of the junction of superior articulating process and transverse process (sacral ala for the L5 dorsal rami) was infiltrated with 83m34mf 1% Lidocaine without Epinephrine.  The 18 gauge 57m62mtive tip outer cannula was advanced in trajectory view to the target.  This procedure was repeated for each target nerve.  Then, for all levels, the outer cannula placement was fine-tuned and the position was then confirmed with bi-planar imaging.    Test stimulation was done both at sensory and  motor levels to ensure there was no radicular stimulation. The target tissues were then infiltrated with 1 ml of 1% Lidocaine without Epinephrine. Subsequently, a percutaneous neurotomy was carried out for 90 seconds at 80 degrees Celsius.  After the completion of the lesion, 1 ml of injectate was delivered. It was then repeated for each facet joint nerve mentioned above. Appropriate radiographs were obtained to verify the probe placement during the neurotomy.   Additional Comments:  The patient tolerated the procedure well Dressing: 2 x 2 sterile gauze and Band-Aid    Post-procedure details: Patient was observed during the procedure. Post-procedure instructions were reviewed.  Patient left the clinic in stable condition.

## 2022-06-13 DIAGNOSIS — N390 Urinary tract infection, site not specified: Secondary | ICD-10-CM | POA: Diagnosis not present

## 2022-06-13 DIAGNOSIS — R82998 Other abnormal findings in urine: Secondary | ICD-10-CM | POA: Diagnosis not present

## 2022-06-13 DIAGNOSIS — N762 Acute vulvitis: Secondary | ICD-10-CM | POA: Diagnosis not present

## 2022-06-14 DIAGNOSIS — D225 Melanocytic nevi of trunk: Secondary | ICD-10-CM | POA: Diagnosis not present

## 2022-06-14 DIAGNOSIS — L503 Dermatographic urticaria: Secondary | ICD-10-CM | POA: Diagnosis not present

## 2022-06-14 DIAGNOSIS — L821 Other seborrheic keratosis: Secondary | ICD-10-CM | POA: Diagnosis not present

## 2022-06-14 DIAGNOSIS — R208 Other disturbances of skin sensation: Secondary | ICD-10-CM | POA: Diagnosis not present

## 2022-06-14 DIAGNOSIS — L814 Other melanin hyperpigmentation: Secondary | ICD-10-CM | POA: Diagnosis not present

## 2022-06-17 ENCOUNTER — Ambulatory Visit (INDEPENDENT_AMBULATORY_CARE_PROVIDER_SITE_OTHER): Payer: Medicare Other | Admitting: Rehabilitative and Restorative Service Providers"

## 2022-06-17 ENCOUNTER — Encounter: Payer: Self-pay | Admitting: Rehabilitative and Restorative Service Providers"

## 2022-06-17 DIAGNOSIS — M542 Cervicalgia: Secondary | ICD-10-CM

## 2022-06-17 DIAGNOSIS — M6281 Muscle weakness (generalized): Secondary | ICD-10-CM

## 2022-06-17 DIAGNOSIS — R293 Abnormal posture: Secondary | ICD-10-CM | POA: Diagnosis not present

## 2022-06-17 NOTE — Therapy (Addendum)
OUTPATIENT PHYSICAL THERAPY TREATMENT /DISCHARGE   Patient Name: Tina Murray MRN: [id number] DOB:08-08-1951, 71 y.o., female Today's Date: 06/17/2022  PCP: [name] MD  REFERRING PROVIDER: Lorine Bears, NP  END OF SESSION:    PT End of Session - 06/17/22 1348     Visit Number 3    Number of Visits 20    Date for PT Re-Evaluation 07/29/22    Authorization Type Medicare    Progress Note Due on Visit 10    PT Start Time 1345    PT Stop Time 1410    PT Time Calculation (min) 25 min    Activity Tolerance Patient tolerated treatment well    Behavior During Therapy WFL for tasks assessed/performed               Past Medical History:  Diagnosis Date   DDD (degenerative disc disease), lumbar    GAD (generalized anxiety disorder)    Headache    Hypercholesterolemia    Hyperglycemia    Hyperlipemia    Lumbar radiculopathy    Migraine    Osteoarthritis    Paresthesia of foot    right   Sacroiliac joint pain    Sciatica of right side    Scoliosis    Seasonal allergies    Past Surgical History:  Procedure Laterality Date   OVARIAN CYST REMOVAL Left 1977   Patient Active Problem List   Diagnosis Date Noted   Chronic migraine w/o aura w/o status migrainosus, not intractable 01/10/2021   Anxiety 01/10/2021   Chronic migraine 03/03/2020   Chronic neck pain 03/03/2020   Paresthesia 11/24/2017   Neck pain 11/24/2017   REFERRING DIAG: M54.2,G89.29 (ICD-10-CM) - Chronic neck pain M54.12 (ICD-10-CM) - Radiculopathy, cervical region M79.18 (ICD-10-CM) - Myofascial pain syndrome  THERAPY DIAG:  Cervicalgia  Muscle weakness (generalized)  Abnormal posture  Rationale for Evaluation and Treatment Rehabilitation  ONSET DATE: 05/12/2022 Acute worsening of chronic condition  SUBJECTIVE:                                                                                                                                                                                                          SUBJECTIVE STATEMENT: Pt indicated thinking her neck did feel a little better.  Reported not walking properly due to boot.   Pt. Indicated not too much soreness.   PERTINENT HISTORY:  DDD lumbar, hypercholesterolemia, hyperlipidemia, OA, scoliosis.  Cervical MRI imaging from 2019 exhibits multilevel facet hypertrophy. There is also retrolisthesis and moderate spinal canal stenosis at C5-C6. No history of  cervical surgery/cervical injections.  PAIN:  NPRS scale: at current 5/10 Pain location: Neck pain, shoulder bilaterally Pain description: tightness, throbbing Aggravating factors: static positioning/less active Relieving factors: medicine, massage gun, volaren gel  PRECAUTIONS: None  WEIGHT BEARING RESTRICTIONS No  FALLS:  Has patient fallen in last 6 months? No  LIVING ENVIRONMENT: Lives in: House/apartment   OCCUPATION: retired  PLOF: Independent, volleyball, workouts  PATIENT GOALS ;  Reduce pain, "feel better."  OBJECTIVE:   PATIENT SURVEYS:  05/20/2022 FOTO intake: 59   predicted:  77  COGNITION: 05/20/2022 Overall cognitive status: Within functional limits for tasks assessed  SENSATION: 05/20/2022 WFL in clinic  POSTURE:  05/20/2022  Rounded shoulders, mild forward head posture  PALPATION: 05/20/2022 Upper trap trigger points c concordant local and head related symptoms.    CERVICAL ROM:   Active ROM AROM (deg) 05/20/2022 AROM 06/10/2022 AROM 06/17/2022  Flexion 70 no complaints  70   Extension 48 c neck soreness 71   Right lateral flexion     Left lateral flexion     Right rotation 60 62 70  Left rotation 52 62 71   (Blank rows = not tested)  UPPER EXTREMITY ROM:  ROM Right 05/20/2022 Left 05/20/2022  Shoulder flexion    Shoulder extension    Shoulder abduction    Shoulder adduction    Shoulder extension    Shoulder internal rotation    Shoulder external rotation    Elbow flexion    Elbow extension     Wrist flexion    Wrist extension    Wrist ulnar deviation    Wrist radial deviation    Wrist pronation    Wrist supination     (Blank rows = not tested)  UPPER EXTREMITY MMT:  MMT Right 05/20/2022 Left 05/20/2022  Shoulder flexion 4/5 5/5  Shoulder extension    Shoulder abduction 5/5 5/5  Shoulder adduction    Shoulder extension    Shoulder internal rotation 5/5 5/5  Shoulder external rotation 5/5 5/5  Middle trapezius    Lower trapezius    Elbow flexion 5/5 5/5  Elbow extension 5/5 5/5  Wrist flexion    Wrist extension    Wrist ulnar deviation    Wrist radial deviation    Wrist pronation    Wrist supination    Grip strength     (Blank rows = not tested)  CERVICAL SPECIAL TESTS:  05/20/2022 Normal accessory movement within cervical spine.   FUNCTIONAL TESTS:  05/20/2022 None specifically tested today   TODAY'S TREATMENT:  06/17/2022 Manual:  compression to bilateral upper trap.  Regional PA mob g5 to mid thoracic x 2 (no cavitation noted)   Trigger Point Dry-Needling  Treatment instructions: Expect mild to moderate muscle soreness. S/S of pneumothorax if dry needled over a lung field, and to seek immediate medical attention should they occur. Patient verbalized understanding of these instructions and education.  Patient Consent Given: Yes Education handout provided: Previously provided Muscles treated: Bilateral upper trap  Treatment response/outcome: No adverse reactions   Therex: Moist heat bilateral neck while performing upper trap stretch alternating   06/10/2022 Manual:  compression to bilateral upper trap.  Regional PA mob g5 to mid thoracic x 2 (no cavitation noted)   Trigger Point Dry-Needling  Treatment instructions: Expect mild to moderate muscle soreness. S/S of pneumothorax if dry needled over a lung field, and to seek immediate medical attention should they occur. Patient verbalized understanding of these instructions and  education.  Patient Consent Given:  Yes Education handout provided: Previously provided Muscles treated: Bilateral upper trap  Treatment response/outcome: No adverse reactions   Therex: Moist heat bilateral neck while performing upper trap stretch Review of existing HEP for knowledge Tband rows green x 15 Tband gh ext green x 15   05/20/2022 Therex:    HEP instruction/performance c cues for techniques, handout provided.  Trial set performed of each for comprehension and symptom assessment.  See below for exercise list  Manual Compression to LT and Rt upper trap c movement  PATIENT EDUCATION:  05/20/2022 Education details: HEP, POC, DN Person educated: Patient Education method: Explanation, Demonstration, Verbal cues, and Handouts Education comprehension: verbalized understanding, returned demonstration, and verbal cues required   HOME EXERCISE PROGRAM: Access Code: [email or web address] URL: [email or web address] Date: 05/20/2022 Prepared by: Scot Jun  Exercises - Supine Cervical Retraction with Towel  - 1-2 x daily - 7 x weekly - 1 sets - 10 reps - 5 hold - Supine Scapular Retraction  - 2-3 x daily - 7 x weekly - 1 sets - 10 reps - 5 hold - Seated Scapular Retraction  - 2-3 x daily - 7 x weekly - 1 sets - 10 reps - 3-5 hold - Seated Upper Trapezius Stretch  - 2-3 x daily - 7 x weekly - 1 sets - 5 reps - 15 hold  ASSESSMENT:  CLINICAL IMPRESSION: Continued improvement in cervical range noted from last visit to today.  Positive improvement in symptoms reported.  Reduced tenderness to touch and reduced amount of trigger points noted today compared to last visit.    OBJECTIVE IMPAIRMENTS decreased activity tolerance, decreased coordination, decreased mobility, decreased ROM, decreased strength, increased fascial restrictions, impaired perceived functional ability, increased muscle spasms, impaired flexibility, impaired UE functional use, improper body mechanics,  postural dysfunction, and pain.   ACTIVITY LIMITATIONS carrying, lifting, sitting, standing, and reach over head  PARTICIPATION LIMITATIONS: interpersonal relationship, driving, shopping, and community activity  PERSONAL FACTORS  DDD lumbar, hypercholesterolemia, hyperlipidemia, OA, scoliosis  are also affecting patient's functional outcome.   REHAB POTENTIAL: Good  CLINICAL DECISION MAKING: Stable/uncomplicated  EVALUATION COMPLEXITY: Low   GOALS: Goals reviewed with patient? Yes  Short term PT Goals (target date for Short term goals are 3 weeks 06/10/2022) Patient will demonstrate independent use of home exercise program to maintain progress from in clinic treatments. Goal status: Met 06/10/2022   Long term PT goals (target dates for all long term goals are 10 weeks  07/29/2022 )   1. Patient will demonstrate/report pain at worst less than or equal to 2/10 to facilitate minimal limitation in daily activity secondary to pain symptoms. Goal status: on going - 06/10/2022   2. Patient will demonstrate independent use of home exercise program to facilitate ability to maintain/progress functional gains from skilled physical therapy services. Goal status: on going - 06/10/2022   3. Patient will demonstrate FOTO outcome > or = 67 % to indicate reduced disability due to condition. Goal status: on going - 06/10/2022   4.  Patient will demonstrate cervical AROM WFL s symptoms to facilitate usual head movements for daily activity including driving, self care.   Goal status: on going - 06/10/2022   5.  Patient will demonstrate/report ability to perform usual housework activity, gym workouts at PLOF.    Goal status: on going - 06/10/2022   6.  Patient will demonstrate bilateral shoulder strength 5/5 throughout.   Goal status: on going - 06/10/2022      PLAN: PT FREQUENCY: 1-2x/week  PT  DURATION: 10 weeks  PLANNED INTERVENTIONS:  Therapeutic exercises, Therapeutic activity,  Neuro Muscular re-education, Balance training, Gait training, Patient/Family education, Joint mobilization, Stair training, DME instructions, Dry Needling, Electrical stimulation, Cryotherapy, Traction Moist heat, Taping, Ultrasound, Ionotophoresis 50m/ml Dexamethasone, and Manual therapy.  All included unless contraindicated  PLAN FOR NEXT SESSION:  FOTO reassessment. Dry needling as desired.    MScot Jun PT, DPT, OCS, ATC 06/17/22  2:09 PM  PHYSICAL THERAPY DISCHARGE SUMMARY  Visits from Start of Care: 3  Current functional level related to goals / functional outcomes: See note   Remaining deficits: See note   Education / Equipment: HEP  Patient goals were partially met. Patient is being discharged due to not returning since the last visit.  MScot Jun PT, DPT, OCS, ATC 07/17/22  12:04 PM

## 2022-06-21 NOTE — Telephone Encounter (Signed)
Pt requested call back from clinician.  Discussed increased post needling soreness response from last visit.  Detailed normal reaction variances from dry needling as well as cues for increased HEP use , heat/ice, OTC medicine as able to help control and improve residual soreness/tightness complaints.   Pt expressed understanding.   Scot Jun, PT, DPT, OCS, ATC 06/21/22  10:36 AM

## 2022-06-24 ENCOUNTER — Encounter: Payer: Medicare Other | Admitting: Rehabilitative and Restorative Service Providers"

## 2022-06-27 ENCOUNTER — Telehealth: Payer: Self-pay

## 2022-06-27 ENCOUNTER — Ambulatory Visit (INDEPENDENT_AMBULATORY_CARE_PROVIDER_SITE_OTHER): Payer: Medicare Other | Admitting: Podiatry

## 2022-06-27 ENCOUNTER — Encounter: Payer: Self-pay | Admitting: Podiatry

## 2022-06-27 ENCOUNTER — Ambulatory Visit (INDEPENDENT_AMBULATORY_CARE_PROVIDER_SITE_OTHER): Payer: Medicare Other

## 2022-06-27 DIAGNOSIS — M84374G Stress fracture, right foot, subsequent encounter for fracture with delayed healing: Secondary | ICD-10-CM | POA: Diagnosis not present

## 2022-06-27 DIAGNOSIS — M84375A Stress fracture, left foot, initial encounter for fracture: Secondary | ICD-10-CM | POA: Diagnosis not present

## 2022-06-27 NOTE — Progress Notes (Signed)
Subjective:   Patient ID: Tina Murray, female   DOB: 71 y.o.   MRN: [id number]   HPI Patient states she is still getting a lot of pain in her left forefoot and states that its been swollen and it has improved some with the boot but still painful   ROS      Objective:  Physical Exam  Neurovascular status intact with patient's left forefoot continuing to so swelling around the second and third metatarsal shaft with still quite a bit of pain in the area slightly improved from previous     Assessment:  Significant fracture of the left second metatarsal most likely stress fracture in nature with mild reduction of the pain with immobilization     Plan:  H&P reviewed x-ray and discussed the type of fracture she has.  At this point she is going to need to stay completely immobilized we did discuss at 1 point it may need to be fixated but I would like to avoid this if we can add with her showing some improvement I am hopeful.  I did dispense rigid bottom surgical shoe that she can start to wear and I want her to continue the boot for at least part of the time.  Continue ice I dispensed compression stockings to try to help control swelling and patient will be seen back in 3 weeks or earlier if needed  X-ray indicates significant fracture with moderate displacement in the proximal portion of the second metatarsal shaft left that is showing changes in both the transverse and sagittal plane

## 2022-06-28 NOTE — Telephone Encounter (Signed)
Patient cam to the clinic yesterday to get her surgical shoe adjusted.

## 2022-07-18 ENCOUNTER — Ambulatory Visit (INDEPENDENT_AMBULATORY_CARE_PROVIDER_SITE_OTHER): Payer: Medicare Other

## 2022-07-18 ENCOUNTER — Ambulatory Visit (INDEPENDENT_AMBULATORY_CARE_PROVIDER_SITE_OTHER): Payer: Medicare Other | Admitting: Podiatry

## 2022-07-18 VITALS — BP 130/84

## 2022-07-18 DIAGNOSIS — M21619 Bunion of unspecified foot: Secondary | ICD-10-CM | POA: Diagnosis not present

## 2022-07-18 DIAGNOSIS — M84375A Stress fracture, left foot, initial encounter for fracture: Secondary | ICD-10-CM

## 2022-07-18 DIAGNOSIS — M2041 Other hammer toe(s) (acquired), right foot: Secondary | ICD-10-CM | POA: Diagnosis not present

## 2022-07-18 NOTE — Progress Notes (Signed)
Subjective:   Patient ID: Tina Murray, female   DOB: 71 y.o.   MRN: [id number]   HPI Patient presents stating that her left foot is gradually getting better still swollen and she really needs her right bunion and toe fixed as it has been bothering her she had surgery on 8 years ago and its become more aggravating over the last couple years   ROS      Objective:  Physical Exam  Neurovascular status intact with swelling of the left forefoot that is improving still present pain around the second metatarsal shaft with patient found to have structural bunion deformity right with redness and elevated fourth digit right with pain on the head of the proximal phalanx     Assessment:  Fracture of left that is healing with patient having remembered injury with structural bunion deformity right hammertoe deformity fourth right     Plan:  H&P reviewed both conditions at this time.  At this point for the left I do think healing is occurring she may return to rigid bottom shoes and for the right I did discuss structural bunion correction along with hammertoe repair she wants this done is motivated to get it done understands this is revisional surgery which is always more difficult with more chances for not getting good results but I do think she will do well based on x-ray and at this point I went ahead and I allowed her to read consent form going over all possible complications and alternative treatments.  She wants surgery after extensive review signed consent form I explained everything to her and she understands no guarantee as far as success of procedures in the total recovery.  Can take 6 months.  Patient scheduled outpatient surgery all questions answered today will have done in January and will call us if any further questions should come

## 2022-07-25 DIAGNOSIS — E7800 Pure hypercholesterolemia, unspecified: Secondary | ICD-10-CM | POA: Insufficient documentation

## 2022-07-30 ENCOUNTER — Telehealth: Payer: Self-pay

## 2022-07-30 NOTE — Telephone Encounter (Signed)
Tina Murray called to cancel her surgery with Dr. Paulla Dolly on 09/10/2022. She stated she wants to hold off on getting the surgery done at this time, She stated she will call me when she is ready to reschedule. Notified Dr. Paulla Dolly and Caren Griffins with [facility]

## 2022-08-14 DIAGNOSIS — H532 Diplopia: Secondary | ICD-10-CM | POA: Diagnosis not present

## 2022-08-14 DIAGNOSIS — H04123 Dry eye syndrome of bilateral lacrimal glands: Secondary | ICD-10-CM | POA: Diagnosis not present

## 2022-08-14 DIAGNOSIS — H43813 Vitreous degeneration, bilateral: Secondary | ICD-10-CM | POA: Diagnosis not present

## 2022-08-14 DIAGNOSIS — Z961 Presence of intraocular lens: Secondary | ICD-10-CM | POA: Diagnosis not present

## 2022-08-20 NOTE — Progress Notes (Signed)
Office Visit Note  Patient: Tina Murray             Date of Birth: 14-Sep-1950           MRN: [id number]             PCP: Lurline Del, DO Referring: Dian Queen, MD Visit Date: 08/21/2022   Subjective:  New Patient (Initial Visit) and Osteoporosis   History of Present Illness: Tina Murray is a 72 y.o. female here for osteoporosis.  She has previous diagnosis based on low bone density on DEXA scan but is also sustained previous right shoulder fracture also lumbar spine and pelvis fracture from falls.  She has some issue with chronic dysphagia and also severe GERD so cannot tolerate oral bisphosphonate treatment.  She was started on Reclast with an initial infusion in 2010 and she had a second dose in 2012.  There was some kind of issue she thinks she had some throat tightening raise concern for allergic reaction but never progressed into major complication she has noted similar symptoms whether perceived dyspnea or globus sensation related to her anxiety.  Regardless more recently she was started on Evenity in March of last year with Dr. Paulla Fore she received 2 injections of this medication but had significant body and muscle pain following the treatment so did not receive any additional doses. Besides the osteoporosis she also has issues with generalized osteoarthritis affecting multiple areas.  She had a recent radiofrequency ablation procedure with Dr. Ernestina Patches for chronic back pain.  She has had hand surgery multiple procedures with Dr. Jeannie Fend and Dr. Fredna Dow.  She has had conservative treatment with injections with first [facility] joint osteoarthritis and trigger fingers.  She also uses topical diclofenac gel for her fingers which is helpful. She experiences mildly erythematous skin rash on the neck and upper chest without specific trigger and no residual skin discoloration.  04/12/21 DEXA Scan - [facility] [place] [place] L1-L4 spine   -2.4 Left femoral neck -3.3 Right femoral  neck -3.0   Activities of Daily Living:  Patient reports morning stiffness for 10 minutes.   Patient Denies nocturnal pain.  Difficulty dressing/grooming: Denies Difficulty climbing stairs: Denies Difficulty getting out of chair: Denies Difficulty using hands for taps, buttons, cutlery, and/or writing: Denies  Review of Systems  Constitutional:  Negative for fatigue.  HENT:  Negative for mouth sores and mouth dryness.   Eyes:  Positive for dryness.  Respiratory:  Negative for shortness of breath.   Cardiovascular:  Negative for chest pain and palpitations.  Gastrointestinal:  Negative for blood in stool, constipation and diarrhea.  Endocrine: Negative for increased urination.  Genitourinary:  Negative for involuntary urination.  Musculoskeletal:  Positive for joint pain, joint pain, myalgias, morning stiffness, muscle tenderness and myalgias. Negative for gait problem, joint swelling and muscle weakness.  Skin:  Positive for rash, hair loss and sensitivity to sunlight. Negative for color change.  Allergic/Immunologic: Negative for susceptible to infections.  Neurological:  Positive for headaches. Negative for dizziness.  Hematological:  Negative for swollen glands.  Psychiatric/Behavioral:  Negative for depressed mood and sleep disturbance. The patient is nervous/anxious.     PMFS History:  Patient Active Problem List   Diagnosis Date Noted   Age-related osteoporosis without current pathological fracture 08/27/2022   Osteoporosis 08/21/2022   Vitamin D deficiency 08/21/2022   Chronic migraine w/o aura w/o status migrainosus, not intractable 01/10/2021   Anxiety 01/10/2021   Chronic migraine 03/03/2020   Chronic neck pain 03/03/2020  Paresthesia 11/24/2017   Neck pain 11/24/2017    Past Medical History:  Diagnosis Date   DDD (degenerative disc disease), lumbar    GAD (generalized anxiety disorder)    Headache    Hypercholesterolemia    Hyperglycemia    Hyperlipemia     Lumbar radiculopathy    Migraine    Osteoarthritis    Osteoporosis    Paresthesia of foot    right   Sacroiliac joint pain    Sciatica of right side    Scoliosis    Seasonal allergies     Family History  Problem Relation Age of Onset   Stroke Mother        age 90   COPD Mother    Other Father        age 40 - complications from surgery   Hyperlipidemia Brother    Heart attack Brother        age 65   Past Surgical History:  Procedure Laterality Date   OVARIAN CYST REMOVAL Left 1977   Social History   Social History Narrative   Lives at home with husband.   Right-handed.   One cup caffeine daily.   Immunization History  Administered Date(s) Administered   PFIZER(Purple Top)SARS-COV-2 Vaccination 09/02/2019, 09/23/2019, 06/11/2020, 11/08/2020     Objective: Vital Signs: BP 116/77 (BP Location: Right Arm, Patient Position: Sitting, Cuff Size: Normal)   Pulse 80   Resp 16   Ht 5' 3.5" (1.613 m)   Wt 123 lb (55.8 kg)   BMI 21.45 kg/m    Physical Exam Eyes:     Conjunctiva/sclera: Conjunctivae normal.  Cardiovascular:     Rate and Rhythm: Normal rate and regular rhythm.  Pulmonary:     Effort: Pulmonary effort is normal.     Breath sounds: Normal breath sounds.  Lymphadenopathy:     Cervical: No cervical adenopathy.  Skin:    General: Skin is warm and dry.  Neurological:     Mental Status: She is alert.  Psychiatric:        Mood and Affect: Mood normal.      Musculoskeletal Exam:  Severe 1st CMC joint squaring worse on left, MCP hyperextension PIP and DIP joint heberdon's nodes throughout both hands No significant triggering Lumbar scoliosis without significant tenderness to palpation Normal hip internal and external rotation Decreased 1st MTP joints flexion ROM, right 1st MTP with medial bunion  Investigation: No additional findings.  Imaging: No results found.  Recent Labs: Lab Results  Component Value Date   NA 143 08/21/2022   K 4.8  08/21/2022   CL 104 08/21/2022   CO2 28 08/21/2022   GLUCOSE 88 08/21/2022   BUN 19 08/21/2022   CREATININE 0.90 08/21/2022   CALCIUM 9.7 08/21/2022    Speciality Comments: No specialty comments available.  Procedures:  No procedures performed Allergies: Patient has no known allergies.   Assessment / Plan:     Visit Diagnoses: Age-related osteoporosis with current pathological fracture with routine healing, subsequent encounter - Plan: Parathyroid hormone, intact (no Ca), BASIC METABOLIC PANEL WITH GFR  Severe osteoporosis based on her bone density result and the fracture history.  We discussed multiple treatment options such as PTH analogs, resuming Evenity treatment, or resuming antiresorptive drugs such as Prolia shots or Reclast infusion.  Also checking basic metabolic panel for calcium and renal function and the PTH.  Initially inclined to proceed with resuming the Evenity treatment.  However on follow-up telephone conversation later in same clinic day  she had additional concern about the side effects and prefer to try with resuming the Reclast infusion.  Vitamin D deficiency - Plan: VITAMIN D 25 Hydroxy (Vit-D Deficiency, Fractures)  Checking vitamin D for adequacy year if she needs to adjust her dosing anticipating resuming pharmacologic treatment.  Orders: Orders Placed This Encounter  Procedures   Parathyroid hormone, intact (no Ca)   VITAMIN D 25 Hydroxy (Vit-D Deficiency, Fractures)   BASIC METABOLIC PANEL WITH GFR   No orders of the defined types were placed in this encounter.    Follow-Up Instructions: Return in about 3 months (around 11/20/2022).   Collier Salina, MD  Note - This record has been created using [employer].  Chart creation errors have been sought, but may not always  have been located. Such creation errors do not reflect on  the standard of medical care.

## 2022-08-21 ENCOUNTER — Telehealth: Payer: Self-pay | Admitting: Internal Medicine

## 2022-08-21 ENCOUNTER — Encounter: Payer: Self-pay | Admitting: Internal Medicine

## 2022-08-21 ENCOUNTER — Ambulatory Visit: Payer: Medicare Other | Attending: Internal Medicine | Admitting: Internal Medicine

## 2022-08-21 ENCOUNTER — Telehealth: Payer: Self-pay | Admitting: *Deleted

## 2022-08-21 ENCOUNTER — Telehealth: Payer: Self-pay | Admitting: Pharmacist

## 2022-08-21 VITALS — BP 116/77 | HR 80 | Resp 16 | Ht 63.5 in | Wt 123.0 lb

## 2022-08-21 DIAGNOSIS — E559 Vitamin D deficiency, unspecified: Secondary | ICD-10-CM

## 2022-08-21 DIAGNOSIS — M810 Age-related osteoporosis without current pathological fracture: Secondary | ICD-10-CM | POA: Insufficient documentation

## 2022-08-21 DIAGNOSIS — M8000XD Age-related osteoporosis with current pathological fracture, unspecified site, subsequent encounter for fracture with routine healing: Secondary | ICD-10-CM | POA: Diagnosis not present

## 2022-08-21 NOTE — Telephone Encounter (Signed)
Patient left a voicemail stating after reading the handout on the osteoporosis medication, she has decided she does not want to take it.

## 2022-08-21 NOTE — Patient Instructions (Signed)
Romosozumab Injection What is this medication? ROMOSOZUMAB (roe moe SOZ ue mab) prevents and treats osteoporosis. It works by making your bones stronger and less likely to break (fracture). It is a monoclonal antibody. This medicine may be used for other purposes; ask your health care provider or pharmacist if you have questions. COMMON BRAND NAME(S): EVENITY What should I tell my care team before I take this medication? They need to know if you have any of these conditions: Dental disease Heart attack Heart disease Kidney problems Low levels of calcium in the blood On dialysis Stroke Wear dentures An unusual or allergic reaction to romosozumab, other medications, foods, dyes or preservatives Pregnant or trying to get pregnant Breast-feeding How should I use this medication? This medication is injected under the skin. It is given by your care team in a hospital or clinic setting. A special MedGuide will be given to you by the pharmacist with each prescription and refill. Be sure to read this information carefully each time. Talk to your care team about the use of this medication in children. Special care may be needed. Overdosage: If you think you have taken too much of this medicine contact a poison control center or emergency room at once. NOTE: This medicine is only for you. Do not share this medicine with others. What if I miss a dose? Keep appointments for follow-up doses. It is important not to miss your dose. Call your care team if you are unable to keep an appointment. What may interact with this medication? Interactions are not expected. This list may not describe all possible interactions. Give your health care provider a list of all the medicines, herbs, non-prescription drugs, or dietary supplements you use. Also tell them if you smoke, drink alcohol, or use illegal drugs. Some items may interact with your medicine. What should I watch for while using this medication? Your  condition will be monitored carefully while you are receiving this medication. You may need bloodwork while taking this medication. You should make sure you get enough calcium and vitamin D while you are taking this medication. Discuss the foods you eat and the vitamins you take with your care team. Some people who take this medication have severe bone, joint, or muscle pain. This medication may also increase your risk for jaw problems or a broken thigh bone. Tell your care team right away if you have severe pain in your jaw, bones, joints, or muscles. Tell you care team if you have any pain that does not go away or that gets worse. Tell your dentist and dental surgeon that you are taking this medication. You should not have major dental surgery while on this medication. See your dentist to have a dental exam and fix any dental problems before starting this medication. Take good care of your teeth while on this medication. Make sure you see your dentist for regular follow-up appointments. What side effects may I notice from receiving this medication? Side effects that you should report to your care team as soon as possible: Allergic reactions or angioedema--skin rash, itching or hives, swelling of the face, eyes, lips, tongue, arms, or legs, trouble swallowing or breathing Heart attack--pain or tightness in the chest, shoulders, arms, or jaw, nausea, shortness of breath, cold or clammy skin, feeling faint or lightheaded Low calcium level--muscle pain or cramps, confusion, tingling, or numbness in the hands or feet Osteonecrosis of the jaw--pain, swelling, or redness in the mouth, numbness of the jaw, poor healing after dental work,   unusual discharge from the mouth, visible bones in the mouth Severe bone, joint, or muscle pain Stroke--sudden numbness or weakness of the face, arm, or leg, trouble speaking, confusion, trouble walking, loss of balance or coordination, dizziness, severe headache, change in  vision Side effects that usually do not require medical attention (report to your care team if they continue or are bothersome): Headache Joint pain Muscle spasms Pain, redness, or irritation at injection site Swelling of the ankles, hands, or feet This list may not describe all possible side effects. Call your doctor for medical advice about side effects. You may report side effects to FDA at 1-800-FDA-1088. Where should I keep my medication? This medication is given in a hospital or clinic. It will not be stored at home. NOTE: This sheet is a summary. It may not cover all possible information. If you have questions about this medicine, talk to your doctor, pharmacist, or health care provider.  2023 Elsevier/Gold Standard (2021-08-29 00:00:00)  

## 2022-08-21 NOTE — Telephone Encounter (Signed)
Patient called the office stating she was seen today and a medication was discussed for Osteoporosis. Patient states she has gotten home and read through the information for the medication. Patient states she has decided she does not want to do Evenity. Patient states she is not comfortable with the side effects. Patient states she has had 2 doses of Reclast before and would prefer to go back on the Reclast. Please advise.

## 2022-08-21 NOTE — Telephone Encounter (Signed)
Patient will be restarted on Evenity. She only received two doses (March 2023 x 2 doses).  She will only receive 10 doses to complete Evenity series. Will place referral to [facility] once labs from today have resulted.  Knox Saliva, PharmD, MPH, BCPS, CPP Clinical Pharmacist (Rheumatology and Pulmonology)

## 2022-08-22 LAB — BASIC METABOLIC PANEL WITH GFR
BUN: 19 mg/dL (ref 7–25)
CO2: 28 mmol/L (ref 20–32)
Calcium: 9.7 mg/dL (ref 8.6–10.4)
Chloride: 104 mmol/L (ref 98–110)
Creat: 0.9 mg/dL (ref 0.60–1.00)
Glucose, Bld: 88 mg/dL (ref 65–99)
Potassium: 4.8 mmol/L (ref 3.5–5.3)
Sodium: 143 mmol/L (ref 135–146)
eGFR: 68 mL/min/{1.73_m2} (ref >=60)

## 2022-08-22 LAB — PARATHYROID HORMONE, INTACT (NO CA): PTH: 66 pg/mL (ref 16–77)

## 2022-08-22 LAB — VITAMIN D 25 HYDROXY (VIT D DEFICIENCY, FRACTURES): Vit D, 25-Hydroxy: 40 ng/mL (ref 30–100)

## 2022-08-22 NOTE — Telephone Encounter (Signed)
Lab results look fine. I recommend we go ahead with starting Reclast.  FYI- I spoke with Ms. Payson today to discuss this. We reviewed side effects of the medications there is possible increase CV risk for some individuals with romozosumab although she has no specific contraindication. The risk for problems such as ONJ and AFF with long term use may be related with any anabolic or antiresorptive treatment. Also risk for infusion/injection reactions although she has been exposed to multiple doses of both drugs with no anaphylactic event which is reassuring.

## 2022-08-22 NOTE — Telephone Encounter (Signed)
See previous phone note.  

## 2022-08-22 NOTE — Telephone Encounter (Signed)
Spoke with Dr. Benjamine Mola this morning. He plans to call patient when he gets the chance to further discuss as he did discuss in detail. Patient had advised Dr. Benjamine Mola at [facility] that if she read information on Evenity, it would make her anxious and not want to take medication. Will await f/u from Dr. Benjamine Mola. Referral for Evenity has not been placed at infusion center yet since we were waiting on baseline labs anyways.  Knox Saliva, PharmD, MPH, BCPS, CPP Clinical Pharmacist (Rheumatology and Pulmonology)

## 2022-08-23 NOTE — Telephone Encounter (Signed)
Patient to restart Reclast infusions instead of Evenity. Will close this encounter  Knox Saliva, PharmD, MPH, BCPS, CPP Clinical Pharmacist (Rheumatology and Pulmonology)

## 2022-08-27 ENCOUNTER — Telehealth: Payer: Self-pay | Admitting: Pharmacy Technician

## 2022-08-27 ENCOUNTER — Other Ambulatory Visit: Payer: Self-pay | Admitting: Pharmacist

## 2022-08-27 DIAGNOSIS — M810 Age-related osteoporosis without current pathological fracture: Secondary | ICD-10-CM | POA: Insufficient documentation

## 2022-08-27 NOTE — Progress Notes (Signed)
Referral placed to [facility] for Reclast infusion  Dose: '5mg'$  IV every 12 months  Last Clinic Visit: 08/21/2022 Next Clinic Visit: 11/20/2022  Last infusion: 2012 Labs: 1/10/204 - calcium  wnl, Vitamin D wnl, PTH wnl  Orders placed for Reclast IV x 1 dose along with premedication of acetaminophen and diphenhydramine to be administered 30 minutes before medication infusion.  Knox Saliva, PharmD, MPH, BCPS, CPP Clinical Pharmacist (Rheumatology and Pulmonology)

## 2022-08-27 NOTE — Telephone Encounter (Signed)
Referral for Reclast has been placed to infusion center  Knox Saliva, PharmD, MPH, BCPS, CPP Clinical Pharmacist (Rheumatology and Pulmonology)

## 2022-08-27 NOTE — Telephone Encounter (Signed)
Auth Submission: NO AUTH NEEDED Payer: [employer] Medication & CPT/J Code(s) submitted: Reclast (Zolendronic acid) [id number] Route of submission (phone, fax, portal):  Phone # Fax # Auth type: Buy/Bill Units/visits requested: X1 Reference number:  Approval from: 08/27/22 to 08/12/23

## 2022-08-28 DIAGNOSIS — N390 Urinary tract infection, site not specified: Secondary | ICD-10-CM | POA: Diagnosis not present

## 2022-08-28 DIAGNOSIS — Z6821 Body mass index (BMI) 21.0-21.9, adult: Secondary | ICD-10-CM | POA: Diagnosis not present

## 2022-08-28 DIAGNOSIS — Z01419 Encounter for gynecological examination (general) (routine) without abnormal findings: Secondary | ICD-10-CM | POA: Diagnosis not present

## 2022-09-02 ENCOUNTER — Ambulatory Visit (INDEPENDENT_AMBULATORY_CARE_PROVIDER_SITE_OTHER): Payer: Medicare Other

## 2022-09-02 VITALS — BP 124/81 | HR 67 | Temp 97.4°F | Resp 18 | Ht 65.0 in | Wt 125.0 lb

## 2022-09-02 DIAGNOSIS — M810 Age-related osteoporosis without current pathological fracture: Secondary | ICD-10-CM | POA: Diagnosis not present

## 2022-09-02 MED ORDER — DIPHENHYDRAMINE HCL 25 MG PO CAPS
25.0000 mg | ORAL_CAPSULE | Freq: Once | ORAL | Status: AC
  Administered 2022-09-02: 25 mg via ORAL
  Filled 2022-09-02: qty 1

## 2022-09-02 MED ORDER — ACETAMINOPHEN 325 MG PO TABS
650.0000 mg | ORAL_TABLET | Freq: Once | ORAL | Status: AC
  Administered 2022-09-02: 650 mg via ORAL
  Filled 2022-09-02: qty 2

## 2022-09-02 MED ORDER — ZOLEDRONIC ACID 5 MG/100ML IV SOLN
5.0000 mg | Freq: Once | INTRAVENOUS | Status: AC
  Administered 2022-09-02: 5 mg via INTRAVENOUS
  Filled 2022-09-02: qty 100

## 2022-09-02 MED ORDER — SODIUM CHLORIDE 0.9 % IV SOLN: INTRAVENOUS | Status: DC

## 2022-09-02 NOTE — Progress Notes (Signed)
Diagnosis: Osteoporosis  Provider:  Marshell Garfinkel MD  Procedure: Infusion  IV Type: Peripheral, IV Location: L Antecubital  Reclast (Zolendronic Acid), Dose: 5 mg  Infusion Start Time: 6283  Infusion Stop Time: 1225  Post Infusion IV Care: Observation period completed and Peripheral IV Discontinued  Discharge: Condition: Good, Destination: Home . AVS provided to patient.   Performed by:  Cleophus Molt, RN

## 2022-09-02 NOTE — Progress Notes (Signed)
Reclast scheduled for 09/02/2022  Knox Saliva, PharmD, MPH, BCPS, CPP Clinical Pharmacist (Rheumatology and Pulmonology)

## 2022-09-02 NOTE — Patient Instructions (Signed)

## 2022-09-16 ENCOUNTER — Other Ambulatory Visit: Payer: Medicare Other

## 2022-09-18 DIAGNOSIS — I872 Venous insufficiency (chronic) (peripheral): Secondary | ICD-10-CM | POA: Diagnosis not present

## 2022-09-23 DIAGNOSIS — I83891 Varicose veins of right lower extremities with other complications: Secondary | ICD-10-CM | POA: Diagnosis not present

## 2022-09-23 DIAGNOSIS — I83811 Varicose veins of right lower extremities with pain: Secondary | ICD-10-CM | POA: Diagnosis not present

## 2022-09-23 DIAGNOSIS — Z09 Encounter for follow-up examination after completed treatment for conditions other than malignant neoplasm: Secondary | ICD-10-CM | POA: Diagnosis not present

## 2022-09-30 ENCOUNTER — Other Ambulatory Visit: Payer: Medicare Other

## 2022-10-07 DIAGNOSIS — I83811 Varicose veins of right lower extremities with pain: Secondary | ICD-10-CM | POA: Diagnosis not present

## 2022-10-07 DIAGNOSIS — I83891 Varicose veins of right lower extremities with other complications: Secondary | ICD-10-CM | POA: Diagnosis not present

## 2022-10-07 DIAGNOSIS — M7989 Other specified soft tissue disorders: Secondary | ICD-10-CM | POA: Diagnosis not present

## 2022-10-14 ENCOUNTER — Other Ambulatory Visit: Payer: Self-pay | Admitting: Family Medicine

## 2022-10-14 ENCOUNTER — Other Ambulatory Visit (HOSPITAL_COMMUNITY): Payer: Self-pay | Admitting: Family Medicine

## 2022-10-14 ENCOUNTER — Other Ambulatory Visit (HOSPITAL_BASED_OUTPATIENT_CLINIC_OR_DEPARTMENT_OTHER): Payer: Self-pay | Admitting: Family Medicine

## 2022-10-14 DIAGNOSIS — R4781 Slurred speech: Secondary | ICD-10-CM

## 2022-10-14 DIAGNOSIS — Z6821 Body mass index (BMI) 21.0-21.9, adult: Secondary | ICD-10-CM | POA: Diagnosis not present

## 2022-10-14 DIAGNOSIS — G459 Transient cerebral ischemic attack, unspecified: Secondary | ICD-10-CM

## 2022-10-15 ENCOUNTER — Ambulatory Visit: Payer: Medicare Other

## 2022-10-15 ENCOUNTER — Ambulatory Visit (INDEPENDENT_AMBULATORY_CARE_PROVIDER_SITE_OTHER): Payer: Medicare Other

## 2022-10-15 ENCOUNTER — Ambulatory Visit (HOSPITAL_BASED_OUTPATIENT_CLINIC_OR_DEPARTMENT_OTHER)
Admission: RE | Admit: 2022-10-15 | Discharge: 2022-10-15 | Disposition: A | Discharge status: Home / Self Care | Payer: Medicare Other | Source: Ambulatory Visit | Attending: Family Medicine | Admitting: Family Medicine

## 2022-10-15 ENCOUNTER — Encounter (HOSPITAL_BASED_OUTPATIENT_CLINIC_OR_DEPARTMENT_OTHER): Payer: Self-pay

## 2022-10-15 DIAGNOSIS — R4781 Slurred speech: Secondary | ICD-10-CM | POA: Insufficient documentation

## 2022-10-15 DIAGNOSIS — G459 Transient cerebral ischemic attack, unspecified: Secondary | ICD-10-CM

## 2022-10-15 DIAGNOSIS — I6381 Other cerebral infarction due to occlusion or stenosis of small artery: Secondary | ICD-10-CM | POA: Diagnosis not present

## 2022-10-15 DIAGNOSIS — I6523 Occlusion and stenosis of bilateral carotid arteries: Secondary | ICD-10-CM | POA: Diagnosis not present

## 2022-10-15 LAB — ECHOCARDIOGRAM COMPLETE
Area-P 1/2: 5.13 cm2
MV M vel: 5.15 m/s
MV Peak grad: 106.1 mmHg
S' Lateral: 2.36 cm

## 2022-10-16 ENCOUNTER — Other Ambulatory Visit: Payer: Self-pay | Admitting: Family Medicine

## 2022-10-16 DIAGNOSIS — R4781 Slurred speech: Secondary | ICD-10-CM

## 2022-10-17 ENCOUNTER — Encounter: Payer: Self-pay | Admitting: Internal Medicine

## 2022-10-18 ENCOUNTER — Encounter (HOSPITAL_BASED_OUTPATIENT_CLINIC_OR_DEPARTMENT_OTHER): Payer: Self-pay

## 2022-10-18 ENCOUNTER — Ambulatory Visit (HOSPITAL_COMMUNITY)
Admission: RE | Admit: 2022-10-18 | Discharge: 2022-10-18 | Disposition: A | Discharge status: Home / Self Care | Payer: Medicare Other | Source: Ambulatory Visit | Attending: Family Medicine | Admitting: Family Medicine

## 2022-10-18 ENCOUNTER — Ambulatory Visit (HOSPITAL_BASED_OUTPATIENT_CLINIC_OR_DEPARTMENT_OTHER): Admission: RE | Admit: 2022-10-18 | Payer: Medicare Other | Source: Ambulatory Visit

## 2022-10-18 DIAGNOSIS — R4781 Slurred speech: Secondary | ICD-10-CM | POA: Insufficient documentation

## 2022-10-21 DIAGNOSIS — L309 Dermatitis, unspecified: Secondary | ICD-10-CM | POA: Diagnosis not present

## 2022-10-21 DIAGNOSIS — L905 Scar conditions and fibrosis of skin: Secondary | ICD-10-CM | POA: Diagnosis not present

## 2022-10-21 DIAGNOSIS — L820 Inflamed seborrheic keratosis: Secondary | ICD-10-CM | POA: Diagnosis not present

## 2022-10-22 ENCOUNTER — Encounter: Payer: Self-pay | Admitting: Neurology

## 2022-10-22 ENCOUNTER — Ambulatory Visit (INDEPENDENT_AMBULATORY_CARE_PROVIDER_SITE_OTHER): Payer: Medicare Other | Admitting: Neurology

## 2022-10-22 ENCOUNTER — Other Ambulatory Visit: Payer: Self-pay | Admitting: Neurology

## 2022-10-22 VITALS — BP 116/76 | HR 79 | Ht 65.0 in | Wt 116.5 lb

## 2022-10-22 DIAGNOSIS — I4891 Unspecified atrial fibrillation: Secondary | ICD-10-CM

## 2022-10-22 DIAGNOSIS — G43709 Chronic migraine without aura, not intractable, without status migrainosus: Secondary | ICD-10-CM

## 2022-10-22 DIAGNOSIS — I639 Cerebral infarction, unspecified: Secondary | ICD-10-CM | POA: Insufficient documentation

## 2022-10-22 NOTE — Progress Notes (Signed)
Chief Complaint  Patient presents with   New Patient (Initial Visit)    Rm 15. Alone. Tina Murray 2023/Paper referral for Slurred speech, word finding difficulty - ? TIA.      ASSESSMENT AND PLAN  Tina Murray is a 72 y.o. female   Chronic migraine  Giving her Ubrelvy samples high co-pay from insurance Acute stroke involving left MCA territory, presenting with aphasia,  Multiple small vessel disease on MRI of the brain, Keep aspirin 81 mg daily  Referred to 30 days cardiac monitoring to rule out cardiac arrhythmia as etiology for her small cortical stroke    DIAGNOSTIC DATA (LABS, IMAGING, TESTING) - I reviewed patient records, labs, notes, testing and imaging myself where available.   HISTORICAL  Tina Murray is a 72 year old female, seen in request by her primary care physician Dr. Jacelyn Grip, [place] for evaluation of chronic migraine headaches,  I reviewed and summarized the referring note.  Past medical history anxiety Hyperlipidemia  She reported long history of chronic migraine headaches, especially perimenopause period of time, was treated at [facility], but she forgot the medication  Her headache overall has much improved after menopause, but around 2020, she began to have frequent headaches again, now 1-3 times each week, sometimes she woke up with holoacranial pressure headache, was pounding, light noise sensitivity, occasionally nausea, she has tried over-the-counter medication Advil with limited help, her headache can last for hours or whole day, and sometimes interrupt her schedule  She continue complains of neck, low back pain, we personally reviewed MRI cervical spine May 2019, multilevel degenerative changes, most noticeable at C4-5, degenerative changes causing moderate severe left foraminal narrowing, C5-6, retrolisthesis, degenerative changes cause moderate spinal stenosis,   I saw her previously in April 2019 for evaluation of muscle weakness,  low back pain, after she fell on ice,  MRI of lumbar showed multilevel degenerative disc disease, with moderate foraminal stenosis bilaterally L5-S1, facet hypertrophy, vertebral spurring,  EMG nerve conduction study in May 2019 showed no significant abnormality, in specific, there is no evidence of large fiber peripheral neuropathy, right cervical radiculopathy.  She continue complains of significant arthritic pain, recently had finger surgery which has helped her trigger finger and finger pain  Update June/08/2020 She accompanied by her husband at today's visit, complains of significant anxiety with her husband recent diagnosis of left basal ganglion structural abnormality, she is taking clonazepam low-dose as needed, long history of migraine headaches, now complains of bilateral shoulder pain, often radiating up to become mild to moderate headaches  Yesterday Jan 09, 2021, she also complains of 1 episode of seeing flashing light in her visual field lasting for few minutes, followed by moderate holoacranial headaches, Tylenol only provide limited help  Previous evaluation I have given her prescription of Imitrex 25 mg, she tried it, was not sure about the benefit, prefer not to take too much medications,  Many years ago has tried SSRI treatment for a long history of anxiety, does not want to try it again  We personally reviewed MRI of the cervical spine Dec 12, 2017: Multilevel degenerative changes, most obvious at C4-5, C5-6, C6-7, evidence of moderate canal stenosis at C5-6, with moderate bilateral foraminal narrowing  MRI of lumbar spine multilevel mild degenerative changes, no significant canal stenosis, variable degree of foraminal narrowing, most obvious at left L4-5, and bilateral L5-S1  UPDATE January 23 2022: She complains of right foot discomfort mainly at the ball of her right foot, she had a history  of bilateral bunionectomy in the past, left side continues to do well, but over the  past few years, she developed right first toe valgus deformity again, mild tenderness bearing weight,  She had long history of chronic lumbar and cervical degenerative disease, is under the management of orthopedic surgeon, she has occasionally radiating pain from back to right hip, to right lower extremity  She denies significant gait abnormality no bowel or bladder incontinence  In addition, she has intermittent numbness tingling of bilateral foot, more on the right side, worried she might have a neuropathy  We personally reviewed MRI of lumbar spine 12/08/2021, spondylosis and degenerative disc disease, variable degree of foraminal narrowing at L2-S1 level, there was no significant canal stenosis, potential left L5 pars defect with anterolisthesis of L5 and S1  MRI cervical spine 2019: Multilevel degenerative changes, moderate spinal stenosis at C5-6, no cord signal abnormality variable degree of foraminal narrowing  MRI of the pelvis showed old healed fracture of right pubic rami, sacral at the upper S3 level, edema signal surrounding and within the portion of the left obturator internus muscle, no significant hip pathology noticed  Her Migraine is under good control, only happening occasionally, taking Aleve as needed  UPDATE October 22 2022: She had a sudden onset of speech difficulty on October 12, 2022 while she will happen to talk with her neighbor during her routine walking, symptoms only last for few minutes, she later complained to her primary care, leading to MRI of the brain on October 18, 2022  We personally reviewed the film, acute punctuated stroke at the left temporoparietal region region, evidence of moderate small vessel disease Echocardiogram normal ejection fraction no significant abnormality  Ultrasound of carotid artery less than 50% stenosis at bilateral internal carotid artery antegrade bilateral vertebral artery flow  She was started on aspirin 81 mg daily  She is very  anxious, continue to worry about her husband's health,     PHYSICAL EXAM:   Vitals:   10/22/22 1122  BP: 116/76  Pulse: 79  Weight: 116 lb 8 oz (52.8 kg)  Height: '5\' 5"'$  (1.651 m)   PHYSICAL EXAMNIATION:  Gen: NAD, conversant, well nourised, well groomed                     Cardiovascular: Regular rate rhythm, no peripheral edema, warm, nontender. Eyes: Conjunctivae clear without exudates or hemorrhage Neck: Supple, no carotid bruits. Pulmonary: Clear to auscultation bilaterally   NEUROLOGICAL EXAM:  MENTAL STATUS: Anxious looking middle-aged female Speech/cognition: Awake, alert, oriented to history taking and casual conversation    CRANIAL NERVES: CN II: Visual fields are full to confrontation. Pupils are round equal and briskly reactive to light. CN III, IV, VI: extraocular movement are normal. No ptosis. CN V: Facial sensation is intact to light touch CN VII: Face is symmetric with normal eye closure  CN VIII: Hearing is normal to causal conversation. CN IX, X: Phonation is normal. CN XI: Head turning and shoulder shrug are intact  MOTOR: Normal upper and lower extremity proximal and distal strength, thicked right foot ball pad, increased callus compared to the left side no significant tenderness  REFLEXES: Reflexes are 2+ and symmetric at the biceps, triceps, 3/3 knees, and ankles. Plantar responses are flexor.  SENSORY: Intact to light touch, pinprick and vibratory sensation are intact in fingers and toes.  COORDINATION: There is no trunk or limb dysmetria noted.  GAIT/STANCE: Posture is normal. Gait is steady   REVIEW OF  SYSTEMS:  Full 14 system review of systems performed and notable only for as above All other review of systems were negative.   ALLERGIES: No Known Allergies  HOME MEDICATIONS: Current Outpatient Medications  Medication Sig Dispense Refill   clonazePAM (KLONOPIN) 0.5 MG tablet      diclofenac sodium (VOLTAREN) 1 % GEL diclofenac 1  % topical gel  APPLY 2 GRAM TO THE AFFECTED AREA(S) BY TOPICAL ROUTE 4 TIMES PER DAY     famotidine (PEPCID) 10 MG tablet Take 10 mg by mouth 2 (two) times daily.     Rosuvastatin Calcium 10 MG CPSP Take 10 tablets by mouth daily.     celecoxib (CELEBREX) 100 MG capsule Twice a day as needed. (Patient not taking: Reported on 08/21/2022) 60 capsule 3   No current facility-administered medications for this visit.    PAST MEDICAL HISTORY: Past Medical History:  Diagnosis Date   DDD (degenerative disc disease), lumbar    GAD (generalized anxiety disorder)    Headache    Hypercholesterolemia    Hyperglycemia    Hyperlipemia    Lumbar radiculopathy    Migraine    Osteoarthritis    Osteoporosis    Paresthesia of foot    right   Sacroiliac joint pain    Sciatica of right side    Scoliosis    Seasonal allergies     PAST SURGICAL HISTORY: Past Surgical History:  Procedure Laterality Date   HAND SURGERY Right    Right middle finger trigger release   OVARIAN CYST REMOVAL Left 1977    FAMILY HISTORY: Family History  Problem Relation Age of Onset   Stroke Mother        age 46   COPD Mother    Other Father        age 57 - complications from surgery   Hyperlipidemia Brother    Heart attack Brother        age 27    SOCIAL HISTORY: Social History   Socioeconomic History   Marital status: Married    Spouse name: Not on file   Number of children: 0   Years of education: 14   Highest education level: Associate degree: occupational, Hotel manager, or vocational program  Occupational History   Occupation: Retired  Tobacco Use   Smoking status: Former    Packs/day: 1.00    Years: 2.00    Total pack years: 2.00    Types: Cigarettes    Quit date: 03/03/1979    Years since quitting: 43.6    Passive exposure: Past   Smokeless tobacco: Never  Vaping Use   Vaping Use: Never used  Substance and Sexual Activity   Alcohol use: Yes    Comment: social   Drug use: Never   Sexual  activity: Not on file  Other Topics Concern   Not on file  Social History Narrative   Lives at home with husband.   Right-handed.   One cup caffeine daily.   Social Determinants of Health   Financial Resource Strain: Not on file  Food Insecurity: Not on file  Transportation Needs: Not on file  Physical Activity: Not on file  Stress: Not on file  Social Connections: Not on file  Intimate Partner Violence: Not on file      Tina Murray, M.D. Ph.D.  [facility] [address] [place], [place] [address] Ph: [phone] Fax: [phone]  CC:  Jodene Nam, MD [address] [place],   [address]  Lurline Del, DO

## 2022-10-23 ENCOUNTER — Telehealth: Payer: Self-pay | Admitting: *Deleted

## 2022-10-23 ENCOUNTER — Telehealth: Payer: Self-pay | Admitting: Neurology

## 2022-10-23 DIAGNOSIS — I639 Cerebral infarction, unspecified: Secondary | ICD-10-CM

## 2022-10-23 NOTE — Addendum Note (Signed)
Addended by: Marcial Pacas on: 10/23/2022 02:22 PM   Modules accepted: Orders

## 2022-10-23 NOTE — Telephone Encounter (Signed)
Orders Placed This Encounter  Procedures   Ambulatory referral to Cardiology      Please refer patient to Cardiologist  Dr. Johney Frame, previously I also ordered 30 days cardiac monitoring, can be done at her office.

## 2022-10-23 NOTE — Telephone Encounter (Signed)
Dr Krista Blue - will  refer patient to Cardiologist  Dr. Johney Frame, previously I also ordered 30 days cardiac monitoring, can be done at her office. Referral was placed

## 2022-10-23 NOTE — Telephone Encounter (Signed)
Patient called back stating Dr. Johney Frame did have an opening 11/26/21 and would appreciate a referral to be sent asap in order to schedule an appointment.

## 2022-10-23 NOTE — Telephone Encounter (Signed)
Pt called and stated she needs to talk to nurse. Stated she was at her OB appointment to day and they let her know some information she needs to share with Dr. Krista Blue. Pt is requesting a call back from nurse.

## 2022-10-23 NOTE — Telephone Encounter (Signed)
Noted  

## 2022-10-23 NOTE — Telephone Encounter (Addendum)
IF PT CALLS BACK PLEASE ADVISE HER CARDIO HAS REACHED OUT TO DR Krista Blue ALREADY   I Contacted pt back, she stated she only needed a referral to cardiology, she didn't know what OB was about. She stated Dr Krista Blue asked her if she had one and she doesn't and wants to be established. She proceeded to say something else but the line dropped. I attempted to call pt back and phone went straight to VM.

## 2022-10-23 NOTE — Telephone Encounter (Signed)
Patient contacted our office this morning, stating, she did not want to do the cardiac event monitor ordered by Dr. Krista Blue.  She has eczema and cannot use any product that will stick to her skin.  She asked to please cancel the monitor order. She had in the past used a cardiac monitor device that allowed her to call in once a day.  I explained the two monitoring companies we work with both have electrodes that are applied to the skin. A [employer] shipping label had already been created at [employer].  I reached out to our representative to cancel the enrollment and explained to patient, if she did receive a monitor from them, she would not be charged, and the monitor comes with a prepaid UPS return shipping label.  Patient requested to schedule an appointment to see Dr. Gwyndolyn Kaufman at [facility] at our [place] location.  She would be a new patient.  I explained our office requires a referral to see a cardiologist.  Ms. Blan requested we reach out to Dr. Krista Blue for a referral for her to see Dr. Johney Frame.

## 2022-10-28 ENCOUNTER — Other Ambulatory Visit: Payer: Medicare Other

## 2022-10-31 ENCOUNTER — Emergency Department (HOSPITAL_COMMUNITY): Payer: Medicare Other

## 2022-10-31 ENCOUNTER — Other Ambulatory Visit: Payer: Self-pay

## 2022-10-31 ENCOUNTER — Observation Stay (HOSPITAL_COMMUNITY): Payer: Medicare Other

## 2022-10-31 ENCOUNTER — Inpatient Hospital Stay (HOSPITAL_COMMUNITY)
Admission: EM | Admit: 2022-10-31 | Discharge: 2022-11-03 | DRG: 065 | Disposition: A | Discharge status: Home / Self Care | Payer: Medicare Other | Attending: Internal Medicine | Admitting: Internal Medicine

## 2022-10-31 ENCOUNTER — Encounter (HOSPITAL_COMMUNITY): Payer: Self-pay

## 2022-10-31 DIAGNOSIS — R29700 NIHSS score 0: Secondary | ICD-10-CM | POA: Diagnosis present

## 2022-10-31 DIAGNOSIS — R918 Other nonspecific abnormal finding of lung field: Secondary | ICD-10-CM | POA: Diagnosis not present

## 2022-10-31 DIAGNOSIS — M810 Age-related osteoporosis without current pathological fracture: Secondary | ICD-10-CM | POA: Diagnosis present

## 2022-10-31 DIAGNOSIS — Z8249 Family history of ischemic heart disease and other diseases of the circulatory system: Secondary | ICD-10-CM | POA: Diagnosis not present

## 2022-10-31 DIAGNOSIS — M419 Scoliosis, unspecified: Secondary | ICD-10-CM | POA: Diagnosis present

## 2022-10-31 DIAGNOSIS — I471 Supraventricular tachycardia, unspecified: Secondary | ICD-10-CM | POA: Diagnosis not present

## 2022-10-31 DIAGNOSIS — Z83438 Family history of other disorder of lipoprotein metabolism and other lipidemia: Secondary | ICD-10-CM | POA: Diagnosis not present

## 2022-10-31 DIAGNOSIS — E7800 Pure hypercholesterolemia, unspecified: Secondary | ICD-10-CM | POA: Diagnosis not present

## 2022-10-31 DIAGNOSIS — I6340 Cerebral infarction due to embolism of unspecified cerebral artery: Principal | ICD-10-CM | POA: Diagnosis present

## 2022-10-31 DIAGNOSIS — Z8673 Personal history of transient ischemic attack (TIA), and cerebral infarction without residual deficits: Secondary | ICD-10-CM | POA: Diagnosis not present

## 2022-10-31 DIAGNOSIS — R7303 Prediabetes: Secondary | ICD-10-CM | POA: Diagnosis present

## 2022-10-31 DIAGNOSIS — J984 Other disorders of lung: Secondary | ICD-10-CM | POA: Diagnosis present

## 2022-10-31 DIAGNOSIS — F411 Generalized anxiety disorder: Secondary | ICD-10-CM | POA: Diagnosis present

## 2022-10-31 DIAGNOSIS — I10 Essential (primary) hypertension: Secondary | ICD-10-CM | POA: Diagnosis not present

## 2022-10-31 DIAGNOSIS — D1803 Hemangioma of intra-abdominal structures: Secondary | ICD-10-CM | POA: Diagnosis present

## 2022-10-31 DIAGNOSIS — Z87891 Personal history of nicotine dependence: Secondary | ICD-10-CM

## 2022-10-31 DIAGNOSIS — J302 Other seasonal allergic rhinitis: Secondary | ICD-10-CM | POA: Diagnosis present

## 2022-10-31 DIAGNOSIS — R4701 Aphasia: Secondary | ICD-10-CM | POA: Diagnosis not present

## 2022-10-31 DIAGNOSIS — R4182 Altered mental status, unspecified: Secondary | ICD-10-CM | POA: Diagnosis not present

## 2022-10-31 DIAGNOSIS — I639 Cerebral infarction, unspecified: Secondary | ICD-10-CM | POA: Diagnosis not present

## 2022-10-31 DIAGNOSIS — F419 Anxiety disorder, unspecified: Secondary | ICD-10-CM | POA: Diagnosis not present

## 2022-10-31 DIAGNOSIS — M533 Sacrococcygeal disorders, not elsewhere classified: Secondary | ICD-10-CM | POA: Diagnosis present

## 2022-10-31 DIAGNOSIS — I69314 Frontal lobe and executive function deficit following cerebral infarction: Secondary | ICD-10-CM

## 2022-10-31 DIAGNOSIS — Z7982 Long term (current) use of aspirin: Secondary | ICD-10-CM

## 2022-10-31 DIAGNOSIS — M1990 Unspecified osteoarthritis, unspecified site: Secondary | ICD-10-CM | POA: Diagnosis present

## 2022-10-31 DIAGNOSIS — Z823 Family history of stroke: Secondary | ICD-10-CM

## 2022-10-31 DIAGNOSIS — G464 Cerebellar stroke syndrome: Secondary | ICD-10-CM | POA: Diagnosis not present

## 2022-10-31 DIAGNOSIS — K219 Gastro-esophageal reflux disease without esophagitis: Secondary | ICD-10-CM | POA: Diagnosis present

## 2022-10-31 DIAGNOSIS — R000 Tachycardia, unspecified: Secondary | ICD-10-CM | POA: Diagnosis not present

## 2022-10-31 DIAGNOSIS — Z79899 Other long term (current) drug therapy: Secondary | ICD-10-CM

## 2022-10-31 DIAGNOSIS — I6300 Cerebral infarction due to thrombosis of unspecified precerebral artery: Secondary | ICD-10-CM | POA: Diagnosis not present

## 2022-10-31 LAB — APTT: aPTT: 29 seconds (ref 24–36)

## 2022-10-31 LAB — CBG MONITORING, ED: Glucose-Capillary: 95 mg/dL (ref 70–99)

## 2022-10-31 LAB — DIFFERENTIAL
Abs Immature Granulocytes: 0.01 10*3/uL (ref 0.00–0.07)
Basophils Absolute: 0.1 10*3/uL (ref 0.0–0.1)
Basophils Relative: 1 %
Eosinophils Absolute: 0.1 10*3/uL (ref 0.0–0.5)
Eosinophils Relative: 1 %
Immature Granulocytes: 0 %
Lymphocytes Relative: 29 %
Lymphs Abs: 2.2 10*3/uL (ref 0.7–4.0)
Monocytes Absolute: 0.5 10*3/uL (ref 0.1–1.0)
Monocytes Relative: 7 %
Neutro Abs: 4.6 10*3/uL (ref 1.7–7.7)
Neutrophils Relative %: 62 %

## 2022-10-31 LAB — COMPREHENSIVE METABOLIC PANEL
ALT: 17 U/L (ref 0–44)
AST: 23 U/L (ref 15–41)
Albumin: 4.6 g/dL (ref 3.5–5.0)
Alkaline Phosphatase: 55 U/L (ref 38–126)
Anion gap: 10 (ref 5–15)
BUN: 14 mg/dL (ref 8–23)
CO2: 26 mmol/L (ref 22–32)
Calcium: 9.4 mg/dL (ref 8.9–10.3)
Chloride: 104 mmol/L (ref 98–111)
Creatinine, Ser: 0.92 mg/dL (ref 0.44–1.00)
GFR, Estimated: >60 mL/min (ref >60)
Glucose, Bld: 97 mg/dL (ref 70–99)
Potassium: 3.8 mmol/L (ref 3.5–5.1)
Sodium: 140 mmol/L (ref 135–145)
Total Bilirubin: 0.6 mg/dL (ref 0.3–1.2)
Total Protein: 7.2 g/dL (ref 6.5–8.1)

## 2022-10-31 LAB — PROTIME-INR
INR: 1 (ref 0.8–1.2)
Prothrombin Time: 12.7 seconds (ref 11.4–15.2)

## 2022-10-31 LAB — CBC
HCT: 40.1 % (ref 36.0–46.0)
Hemoglobin: 13.2 g/dL (ref 12.0–15.0)
MCH: 30.6 pg (ref 26.0–34.0)
MCHC: 32.9 g/dL (ref 30.0–36.0)
MCV: 93 fL (ref 80.0–100.0)
Platelets: 318 10*3/uL (ref 150–400)
RBC: 4.31 MIL/uL (ref 3.87–5.11)
RDW: 13.4 % (ref 11.5–15.5)
WBC: 7.3 10*3/uL (ref 4.0–10.5)
nRBC: 0 % (ref 0.0–0.2)

## 2022-10-31 LAB — ETHANOL: Alcohol, Ethyl (B): <10 mg/dL (ref <10)

## 2022-10-31 MED ORDER — LORATADINE 10 MG PO TABS
10.0000 mg | ORAL_TABLET | Freq: Every day | ORAL | Status: DC
  Administered 2022-11-01 – 2022-11-03 (×3): 10 mg via ORAL
  Filled 2022-10-31 (×3): qty 1

## 2022-10-31 MED ORDER — ASPIRIN 81 MG PO TBEC
81.0000 mg | DELAYED_RELEASE_TABLET | Freq: Every day | ORAL | Status: DC
  Administered 2022-10-31 – 2022-11-03 (×4): 81 mg via ORAL
  Filled 2022-10-31 (×4): qty 1

## 2022-10-31 MED ORDER — ENOXAPARIN SODIUM 40 MG/0.4ML IJ SOSY
40.0000 mg | PREFILLED_SYRINGE | INTRAMUSCULAR | Status: DC
  Administered 2022-11-01 – 2022-11-03 (×3): 40 mg via SUBCUTANEOUS
  Filled 2022-10-31 (×4): qty 0.4

## 2022-10-31 MED ORDER — ACETAMINOPHEN 325 MG PO TABS
650.0000 mg | ORAL_TABLET | ORAL | Status: DC | PRN
  Administered 2022-11-01: 650 mg via ORAL
  Filled 2022-10-31: qty 2

## 2022-10-31 MED ORDER — SODIUM CHLORIDE 0.9 % IV BOLUS
500.0000 mL | Freq: Once | INTRAVENOUS | Status: AC
  Administered 2022-10-31: 500 mL via INTRAVENOUS

## 2022-10-31 MED ORDER — ROSUVASTATIN CALCIUM 10 MG PO TABS
10.0000 mg | ORAL_TABLET | Freq: Every day | ORAL | Status: DC
  Filled 2022-10-31: qty 1

## 2022-10-31 MED ORDER — SODIUM CHLORIDE 0.9 % IV SOLN
100.0000 mL/h | INTRAVENOUS | Status: DC
  Administered 2022-10-31: 100 mL/h via INTRAVENOUS

## 2022-10-31 MED ORDER — STROKE: EARLY STAGES OF RECOVERY BOOK
Freq: Once | Status: AC
  Filled 2022-10-31: qty 1

## 2022-10-31 MED ORDER — HYDROXYZINE HCL 25 MG PO TABS: 25.0000 mg | ORAL_TABLET | Freq: Once | ORAL | Status: DC

## 2022-10-31 MED ORDER — ACETAMINOPHEN 160 MG/5ML PO SOLN: 650.0000 mg | ORAL | Status: DC | PRN

## 2022-10-31 MED ORDER — IOHEXOL 350 MG/ML SOLN
80.0000 mL | Freq: Once | INTRAVENOUS | Status: AC | PRN
  Administered 2022-10-31: 80 mL via INTRAVENOUS

## 2022-10-31 MED ORDER — ACETAMINOPHEN 650 MG RE SUPP: 650.0000 mg | RECTAL | Status: DC | PRN

## 2022-10-31 NOTE — ED Triage Notes (Signed)
Taken straight back to room 13 for triage, provider notified. Patient reports 10  min PTA she was experiencing expressive asphasia. States she was in [employer] with her husband and could not express herself. Endorses this last for about 5 minutes and then resolved. Denies loss of coordination,headache, visual changes, weakness or any other symptoms. Reports recently having a TIA or stroke. Provider at bedside

## 2022-10-31 NOTE — ED Provider Notes (Signed)
[facility] Provider Note   CSN: [id number] Arrival date & time: 10/31/22  1246     History  Chief Complaint  Patient presents with   Aphasia    Slurred speech 10 min PTA that has now resolved. Denies any loss of balance, weakness or any other symptoms. Reports has been feeling anxious.     Sarsha Hofmann is a 72 y.o. female.  Cindie Daigler is a 72 y.o. female with a history of recent stroke, migraines, hyperlipidemia and anxiety, who presents to the emergency department for evaluation of episode of expressive aphasia.  Patient reports about 10 minutes prior to arrival she was sitting at a restaurant with her husband having lunch when she suddenly started having episode of expressive aphasia where patient describes this as knowing what she wanted to say but the correct words were not coming out.  She reports that this lasted about 2 minutes and then resolved.  No associated vision changes, facial droop, numbness or weakness.  The symptoms are identical to those that she experienced on March 2 when she was later diagnosed with a stroke with a punctate left temporoparietal infarct noted on MRI.  Patient has since followed with neurology and has had echocardiogram and carotid Dopplers completed as well.  Patient was started on 81 mg aspirin with neurology but was not started on Plavix or any other anticoagulation.  Patient reports that about 2 hours prior to symptoms occurring she took 1/2 tablet of her Klonopin (0.5 mg) due to anxiety.  She reports this is the first time she has taken any Klonopin for anxiety since having her stroke.  She does report she has been dealing with significantly increased anxiety over the past week.  The history is provided by the patient, the spouse and medical records.       Home Medications Prior to Admission medications   Medication Sig Start Date End Date Taking? Authorizing Provider  aspirin EC 81 MG tablet Take  81 mg by mouth daily. Swallow whole.    [provider]  celecoxib (CELEBREX) 100 MG capsule Twice a day as needed. Patient not taking: Reported on 08/21/2022 02/07/22   Marcial Pacas, MD  clonazePAM Bobbye [place]) 0.5 MG tablet     [provider]  diclofenac sodium (VOLTAREN) 1 % GEL diclofenac 1 % topical gel  APPLY 2 GRAM TO THE AFFECTED AREA(S) BY TOPICAL ROUTE 4 TIMES PER DAY    [provider]  famotidine (PEPCID) 10 MG tablet Take 10 mg by mouth 2 (two) times daily.    [provider]  Rosuvastatin Calcium 10 MG CPSP Take 10 tablets by mouth daily.    [provider]      Allergies    Patient has no known allergies.    Review of Systems   Review of Systems  Constitutional:  Negative for chills and fever.  HENT: Negative.    Eyes:  Negative for visual disturbance.  Respiratory:  Negative for shortness of breath.   Cardiovascular:  Negative for chest pain.  Gastrointestinal:  Negative for abdominal pain, nausea and vomiting.  Musculoskeletal:  Negative for arthralgias and myalgias.  Neurological:  Positive for speech difficulty. Negative for dizziness, seizures, syncope, facial asymmetry, weakness, light-headedness, numbness and headaches.  All other systems reviewed and are negative.   Physical Exam Updated Vital Signs BP (!) 146/84   Pulse (!) 108   Temp 98 F (36.7 C)   Resp 20   SpO2  100%  Physical Exam Vitals and nursing note reviewed.  Constitutional:      General: She is not in acute distress.    Appearance: Normal appearance. She is well-developed. She is not diaphoretic.     Comments: Patient is alert, very anxious but is in no acute distress, speech is clear.  HENT:     Head: Normocephalic and atraumatic.     Mouth/Throat:     Mouth: Mucous membranes are moist.     Pharynx: Oropharynx is clear.  Eyes:     General:        Right eye: No discharge.        Left eye: No discharge.     Extraocular Movements: Extraocular  movements intact.     Pupils: Pupils are equal, round, and reactive to light.  Cardiovascular:     Rate and Rhythm: Normal rate and regular rhythm.     Pulses: Normal pulses.     Heart sounds: Normal heart sounds.  Pulmonary:     Effort: Pulmonary effort is normal. No respiratory distress.     Breath sounds: Normal breath sounds. No wheezing or rales.     Comments: Respirations equal and unlabored, patient able to speak in full sentences, lungs clear to auscultation bilaterally  Abdominal:     General: Bowel sounds are normal. There is no distension.     Palpations: Abdomen is soft. There is no mass.     Tenderness: There is no abdominal tenderness. There is no guarding.     Comments: Abdomen soft, nondistended, nontender to palpation in all quadrants without guarding or peritoneal signs  Musculoskeletal:        General: No deformity.     Cervical back: Normal range of motion and neck supple. No tenderness.  Skin:    General: Skin is warm and dry.     Capillary Refill: Capillary refill takes less than 2 seconds.  Neurological:     Mental Status: She is alert and oriented to person, place, and time.     Coordination: Coordination normal.     Comments: Mental Status: Alert and oriented to person, place, and time. Attention and concentration normal. Speech clear. Recent memory is intact. Follows commands. Cranial Nerves: Visual fields grossly intact. EOMI and PERRLA. No nystagmus noted. Facial sensation intact at forehead, maxillary cheek, and chin/mandible bilaterally. No facial asymmetry or weakness. Hearing grossly normal. Uvula is midline, and palate elevates symmetrically. Normal SCM and trapezius strength. Tongue midline without fasciculations. Motor: Muscle strength 5/5 in proximal and distal UE and LE bilaterally. No pronator drift. Muscle tone normal. Reflexes: 2+ and symmetrical in all four extremities.  Sensation: Intact to light touch in upper and lower extremities distally  bilaterally.  Psychiatric:        Mood and Affect: Mood is anxious.        Behavior: Behavior normal.     ED Results / Procedures / Treatments   Labs (all labs ordered are listed, but only abnormal results are displayed) Labs Reviewed  PROTIME-INR  APTT  CBC  DIFFERENTIAL  COMPREHENSIVE METABOLIC PANEL  ETHANOL  CBG MONITORING, ED    EKG None  Radiology No results found.  Procedures Procedures    Medications Ordered in ED Medications  sodium chloride 0.9 % bolus 500 mL (500 mLs Intravenous New Bag/Given 10/31/22 1314)    Followed by  0.9 %  sodium chloride infusion (has no administration in time range)    ED Course/ Medical Decision Making/ A&P  Medical Decision Making Amount and/or Complexity of Data Reviewed Labs: ordered. Radiology: ordered.  Risk Prescription drug management.   72 y.o. female presents to the ED with complaints of episode of expressive aphasia, this involves an extensive number of treatment options, and is a complaint that carries with it a high risk of complications and morbidity.  The differential diagnosis includes new stroke, recrudescence of recent stroke, could be in the setting brief tension from taking Klonopin, panic attack  On arrival pt is nontoxic, vitals Significant for mild tachycardia on arrival which I suspect is related to patient's anxiety, blood pressure of 146/84.  Expressive aphasia has resolved prior to arrival, no other neurological deficits noted on exam.  Additional history obtained from husband at bedside. Previous records obtained and reviewed including recent stroke workup and follow-up with Dr. Krista Blue with neuro   Lab Tests:  I Ordered, reviewed, and interpreted labs, which included: No leukocytosis and normal hemoglobin, no electrolyte derangements and normal renal and liver function, negative ethanol and normal coags  Imaging Studies ordered:  I ordered imaging studies which included  MRI brain, imaging studies pending at shift change  ED Course:   I discussed plan of care and different possibilities with patient and husband at bedside at length and answered all questions.  Clarified of prior diagnoses from stroke diagnosed on 3/8.  At shift change care signed out to [place] who will follow-up on pending MRI.  Will need to consult with neurology regarding whether or not patient needs Plavix added even if there is no new stroke.   Portions of this note were generated with [employer]. Dictation errors may occur despite best attempts at proofreading.         Final Clinical Impression(s) / ED Diagnoses Final diagnoses:  Expressive aphasia    Rx / DC Orders ED Discharge Orders     None         Janet Berlin 10/31/22 1531    Carmin Muskrat, MD 10/31/22 1623

## 2022-10-31 NOTE — H&P (Signed)
History and Physical    PatientMarland Murray Tina Murray [id number] DOB: March 17, 1951 DOA: 10/31/2022 DOS: the patient was seen and examined on 10/31/2022 PCP: Lurline Del, DO  Patient coming from: Home  Chief Complaint:  Chief Complaint  Patient presents with   Aphasia    Slurred speech 10 min PTA that has now resolved. Denies any loss of balance, weakness or any other symptoms. Reports has been feeling anxious.    HPI: Tina Murray is a 72 y.o. female with medical history significant of chronic migraine, osteoporosis, HLD, recent CVA on aspirin who presents with difficulty word-finding.   Around 11:30am she was at a restaurant with her husband when she had sudden onset of aphasia. She knew what she wanted to say but could not get words out. Lasted less than 5 minutes. No focal extremity weakness. No worsening blurred vision but sees floaters frequently with cataract. Reports she had been on aspirin for about 5 days from a recent stroke. She is normally very active walking miles and going to boxing class.   Patient has hx of recent stroke with sudden onset of speech difficulty on 3/2 while talking with a neighbor lasting for a few minutes. She later complained to her primary care and had MRI brain on 10/18/22 revealing punctate acute left temporoparietal infarct. She was started on aspirin and referred for cardiac monitor which she cancelled because she felt it was not needed.  Echo on 3/5 with EF of 55-60% and normal diastolic parameters. No significant valvular abnormalities.  Had bilateral carotid ultrasound on 10/15/22 with bilateral carotid bifurcation plaque resulting in less than 50% diameter ICA stenosis.   In the ED, temperature of 98.68F, HR up to 108 but later normalized to 70s, BP of 128/83 on room air.   CBC and CMP unremarkable.   MRI brain revealing for 2 adjacent punctate infarcts in the right frontal lobe.  ED PA discussed with neurology Dr. Rory Percy who wants her to transfer to  [facility] for further stroke workup and consultation.  Review of Systems: As mentioned in the history of present illness. All other systems reviewed and are negative. Past Medical History:  Diagnosis Date   DDD (degenerative disc disease), lumbar    GAD (generalized anxiety disorder)    Headache    Hypercholesterolemia    Hyperglycemia    Hyperlipemia    Lumbar radiculopathy    Migraine    Osteoarthritis    Osteoporosis    Paresthesia of foot    right   Sacroiliac joint pain    Sciatica of right side    Scoliosis    Seasonal allergies    Past Surgical History:  Procedure Laterality Date   HAND SURGERY Right    Right middle finger trigger release   OVARIAN CYST REMOVAL Left 1977   Social History:  reports that she quit smoking about 43 years ago. Her smoking use included cigarettes. She has a 2.00 pack-year smoking history. She has been exposed to tobacco smoke. She has never used smokeless tobacco. She reports current alcohol use. She reports that she does not use drugs.  No Known Allergies  Family History  Problem Relation Age of Onset   Stroke Mother        age 54   COPD Mother    Other Father        age 77 - complications from surgery   Hyperlipidemia Brother    Heart attack Brother        age 63  Prior to Admission medications   Medication Sig Start Date End Date Taking? Authorizing Provider  aspirin EC 81 MG tablet Take 81 mg by mouth daily. Swallow whole.    [provider]  celecoxib (CELEBREX) 100 MG capsule Twice a day as needed. Patient not taking: Reported on 08/21/2022 02/07/22   Marcial Pacas, MD  clonazePAM Bobbye [place]) 0.5 MG tablet     [provider]  diclofenac sodium (VOLTAREN) 1 % GEL diclofenac 1 % topical gel  APPLY 2 GRAM TO THE AFFECTED AREA(S) BY TOPICAL ROUTE 4 TIMES PER DAY    [provider]  famotidine (PEPCID) 10 MG tablet Take 10 mg by mouth 2 (two) times daily.    [provider]  Rosuvastatin Calcium  10 MG CPSP Take 10 tablets by mouth daily.    [provider]    Physical Exam: Vitals:   10/31/22 1600 10/31/22 1748 10/31/22 1938 10/31/22 2138  BP: 134/85 128/78  128/83  Pulse: 70 64  71  Resp: 16 20  16   Temp:   98.6 F (37 C) 98 F (36.7 C)  TempSrc:   Oral Oral  SpO2: (!) 86% 100%  98%   Constitutional: NAD, calm, comfortable, elderly female appearing Eyes: lids and conjunctivae normal ENMT: Mucous membranes are moist.  Neck: normal, supple Respiratory: clear to auscultation bilaterally, no wheezing, no crackles. Normal respiratory effort. No accessory muscle use.  Cardiovascular: Regular rate and rhythm, no murmurs / rubs / gallops. No extremity edema.  Abdomen: no tenderness, Bowel sounds positive.  Musculoskeletal: no clubbing / cyanosis. No joint deformity upper and lower extremities. Good ROM, no contractures. Normal muscle tone.  Skin: no rashes, lesions, ulcers.  Neurologic: CN 2-12 grossly intact. Sensation intact, . Strength 5/5 in all 4.  No facial asymmetry.  Equal bilateral shoulder shrug.  Intact finger-nose. Psychiatric: Normal judgment and insight. Alert and oriented x 3. ANXIOUS mood. Data Reviewed:  See HPI  Assessment and Plan: * Cerebrovascular accident (CVA) ([place]) -Hx of recent CVA with punctate acute left temporoparietal infarct on 10/12/2022.  Has been on aspirin daily. -MRI brain today revealing for 2 adjacent punctate infarcts in the right frontal lobe. -suspect possibly undiagnosed atrial fibrillation - obtain CTA head and neck - -Echo on 10/15/22 with EF of 55-60% and normal diastolic parameters. No significant valvular abnormalities. -follow neurology recommendations once she arrives at [facility] to assess if new echocardiogram is needed.  -continue daily aspirin and atorvastatin -Obtain A1c and lipids -PT/OT/SLT -Frequent neuro checks and keep on telemetry -Allow for permissive hypertension with blood pressure treatment as needed only  if systolic goes above [id number]   Hypercholesterolemia -continue Rosuvastatin 10mg - pt reports higher dose makes her "ill"  Anxiety -pt very anxious regarding her diagnosis as she otherwise is healthy and avoids most medications      Advance Care Planning:   Code Status: Full Code   Consults: neurology Dr. Rory Percy  Family Communication: none at bedside  Severity of Illness: The appropriate patient status for this patient is OBSERVATION. Observation status is judged to be reasonable and necessary in order to provide the required intensity of service to ensure the patient's safety. The patient's presenting symptoms, physical exam findings, and initial radiographic and laboratory data in the context of their medical condition is felt to place them at decreased risk for further clinical deterioration. Furthermore, it is anticipated that the patient will be medically stable for discharge from the hospital within 2 midnights of admission.   Author:  Orene Desanctis, DO 10/31/2022 10:26 PM  For on call review [email or web address].

## 2022-10-31 NOTE — Assessment & Plan Note (Signed)
-  Hx of recent CVA with punctate acute left temporoparietal infarct on 10/12/2022.  Has been on aspirin daily. -MRI brain today revealing for 2 adjacent punctate infarcts in the right frontal lobe. -suspect possibly undiagnosed atrial fibrillation - obtain CTA head and neck - -Echo on 10/15/22 with EF of 55-60% and normal diastolic parameters. No significant valvular abnormalities. -follow neurology recommendations once she arrives at [facility] to assess if new echocardiogram is needed.  -continue daily aspirin and atorvastatin -Obtain A1c and lipids -PT/OT/SLT -Frequent neuro checks and keep on telemetry -Allow for permissive hypertension with blood pressure treatment as needed only if systolic goes above [id number]

## 2022-10-31 NOTE — Assessment & Plan Note (Signed)
-  continue Rosuvastatin 10mg - pt reports higher dose makes her "ill"

## 2022-10-31 NOTE — ED Notes (Signed)
Hospitalist at bedside 

## 2022-10-31 NOTE — ED Provider Notes (Signed)
Care assumed from [facility], PA-C at shift change. Please see their note for further information.   Briefly:   Patient with history of recent stroke on 3/2 with punctate acute left temporoparietal infarct seen on MRI done on 3/8. Patient was started on aspirin without Plavix at that time. Today around noon had 2 minutes of sudden onset 'word salad' which then completely resolved but was exactly the same as her previous stroke and therefore prompted her visit today. She is alert and oriented without focal deficits at this time.    Ddx: TIA, new infarct, recrudescence of recent stroke in the setting of Klonopin use  Plan: Labs reassuring, MRI pending at shift change and will determine dispo. Depending on results, patient could potentially go home with addition of Plavix. Will need neuro consult regardless  MRI resulted and reveals  Two adjacent punctate infarcts in the right frontal lobe.   I have personally reviewed and interpreted this imaging and agree with radiology interpretation.  Discussed patient with neurology on-call Dr. Rory Percy who recommends patient be admitted to medicine at [facility] for completion of stroke work-up with MRA and CTA of the head. Neurology will follow. Discussed with patient who is understanding and amenable with plan.  Discussed with hospitalist who agrees to admit   This is a shared visit with supervising physician Dr. Vanita Panda who has independently evaluated patient & provided guidance in evaluation/management/disposition, in agreement with care      Nestor Lewandowsky 10/31/22 2046    Carmin Muskrat, MD 11/01/22 [id number]    Carmin Muskrat, MD 11/01/22 [phone]

## 2022-10-31 NOTE — Assessment & Plan Note (Signed)
-  pt very anxious regarding her diagnosis as she otherwise is healthy and avoids most medications

## 2022-11-01 ENCOUNTER — Encounter (HOSPITAL_COMMUNITY): Payer: Self-pay | Admitting: Internal Medicine

## 2022-11-01 DIAGNOSIS — I471 Supraventricular tachycardia, unspecified: Secondary | ICD-10-CM | POA: Diagnosis not present

## 2022-11-01 DIAGNOSIS — Z87891 Personal history of nicotine dependence: Secondary | ICD-10-CM | POA: Diagnosis not present

## 2022-11-01 DIAGNOSIS — F419 Anxiety disorder, unspecified: Secondary | ICD-10-CM | POA: Diagnosis not present

## 2022-11-01 DIAGNOSIS — Z8673 Personal history of transient ischemic attack (TIA), and cerebral infarction without residual deficits: Secondary | ICD-10-CM | POA: Diagnosis not present

## 2022-11-01 DIAGNOSIS — M533 Sacrococcygeal disorders, not elsewhere classified: Secondary | ICD-10-CM | POA: Diagnosis present

## 2022-11-01 DIAGNOSIS — Z823 Family history of stroke: Secondary | ICD-10-CM | POA: Diagnosis not present

## 2022-11-01 DIAGNOSIS — Z83438 Family history of other disorder of lipoprotein metabolism and other lipidemia: Secondary | ICD-10-CM | POA: Diagnosis not present

## 2022-11-01 DIAGNOSIS — Z8249 Family history of ischemic heart disease and other diseases of the circulatory system: Secondary | ICD-10-CM | POA: Diagnosis not present

## 2022-11-01 DIAGNOSIS — J984 Other disorders of lung: Secondary | ICD-10-CM | POA: Diagnosis present

## 2022-11-01 DIAGNOSIS — R29700 NIHSS score 0: Secondary | ICD-10-CM | POA: Diagnosis present

## 2022-11-01 DIAGNOSIS — R7303 Prediabetes: Secondary | ICD-10-CM | POA: Diagnosis present

## 2022-11-01 DIAGNOSIS — E7800 Pure hypercholesterolemia, unspecified: Secondary | ICD-10-CM | POA: Diagnosis present

## 2022-11-01 DIAGNOSIS — I6300 Cerebral infarction due to thrombosis of unspecified precerebral artery: Secondary | ICD-10-CM | POA: Diagnosis not present

## 2022-11-01 DIAGNOSIS — I639 Cerebral infarction, unspecified: Secondary | ICD-10-CM | POA: Diagnosis present

## 2022-11-01 DIAGNOSIS — Z7982 Long term (current) use of aspirin: Secondary | ICD-10-CM | POA: Diagnosis not present

## 2022-11-01 DIAGNOSIS — M419 Scoliosis, unspecified: Secondary | ICD-10-CM | POA: Diagnosis present

## 2022-11-01 DIAGNOSIS — K219 Gastro-esophageal reflux disease without esophagitis: Secondary | ICD-10-CM | POA: Diagnosis present

## 2022-11-01 DIAGNOSIS — I6340 Cerebral infarction due to embolism of unspecified cerebral artery: Secondary | ICD-10-CM | POA: Diagnosis present

## 2022-11-01 DIAGNOSIS — Z79899 Other long term (current) drug therapy: Secondary | ICD-10-CM | POA: Diagnosis not present

## 2022-11-01 DIAGNOSIS — D1803 Hemangioma of intra-abdominal structures: Secondary | ICD-10-CM | POA: Diagnosis present

## 2022-11-01 DIAGNOSIS — R4182 Altered mental status, unspecified: Secondary | ICD-10-CM | POA: Diagnosis not present

## 2022-11-01 DIAGNOSIS — G464 Cerebellar stroke syndrome: Secondary | ICD-10-CM | POA: Diagnosis not present

## 2022-11-01 DIAGNOSIS — R4701 Aphasia: Secondary | ICD-10-CM | POA: Diagnosis present

## 2022-11-01 DIAGNOSIS — I10 Essential (primary) hypertension: Secondary | ICD-10-CM | POA: Diagnosis present

## 2022-11-01 DIAGNOSIS — J302 Other seasonal allergic rhinitis: Secondary | ICD-10-CM | POA: Diagnosis present

## 2022-11-01 DIAGNOSIS — F411 Generalized anxiety disorder: Secondary | ICD-10-CM | POA: Diagnosis present

## 2022-11-01 DIAGNOSIS — R918 Other nonspecific abnormal finding of lung field: Secondary | ICD-10-CM | POA: Diagnosis not present

## 2022-11-01 DIAGNOSIS — M810 Age-related osteoporosis without current pathological fracture: Secondary | ICD-10-CM | POA: Diagnosis present

## 2022-11-01 DIAGNOSIS — M1990 Unspecified osteoarthritis, unspecified site: Secondary | ICD-10-CM | POA: Diagnosis present

## 2022-11-01 LAB — CBC WITH DIFFERENTIAL/PLATELET
Abs Immature Granulocytes: 0.02 10*3/uL (ref 0.00–0.07)
Basophils Absolute: 0.1 10*3/uL (ref 0.0–0.1)
Basophils Relative: 1 %
Eosinophils Absolute: 0.1 10*3/uL (ref 0.0–0.5)
Eosinophils Relative: 1 %
HCT: 43.1 % (ref 36.0–46.0)
Hemoglobin: 14.1 g/dL (ref 12.0–15.0)
Immature Granulocytes: 0 %
Lymphocytes Relative: 26 %
Lymphs Abs: 1.9 10*3/uL (ref 0.7–4.0)
MCH: 30.7 pg (ref 26.0–34.0)
MCHC: 32.7 g/dL (ref 30.0–36.0)
MCV: 93.9 fL (ref 80.0–100.0)
Monocytes Absolute: 0.6 10*3/uL (ref 0.1–1.0)
Monocytes Relative: 9 %
Neutro Abs: 4.6 10*3/uL (ref 1.7–7.7)
Neutrophils Relative %: 63 %
Platelets: 308 10*3/uL (ref 150–400)
RBC: 4.59 MIL/uL (ref 3.87–5.11)
RDW: 13.5 % (ref 11.5–15.5)
WBC: 7.2 10*3/uL (ref 4.0–10.5)
nRBC: 0 % (ref 0.0–0.2)

## 2022-11-01 LAB — COMPREHENSIVE METABOLIC PANEL
ALT: 18 U/L (ref 0–44)
AST: 24 U/L (ref 15–41)
Albumin: 4.4 g/dL (ref 3.5–5.0)
Alkaline Phosphatase: 60 U/L (ref 38–126)
Anion gap: 10 (ref 5–15)
BUN: 14 mg/dL (ref 8–23)
CO2: 23 mmol/L (ref 22–32)
Calcium: 9.3 mg/dL (ref 8.9–10.3)
Chloride: 105 mmol/L (ref 98–111)
Creatinine, Ser: 0.99 mg/dL (ref 0.44–1.00)
GFR, Estimated: >60 mL/min (ref >60)
Glucose, Bld: 107 mg/dL — ABNORMAL HIGH (ref 70–99)
Potassium: 3.5 mmol/L (ref 3.5–5.1)
Sodium: 138 mmol/L (ref 135–145)
Total Bilirubin: 0.6 mg/dL (ref 0.3–1.2)
Total Protein: 7.2 g/dL (ref 6.5–8.1)

## 2022-11-01 LAB — LIPID PANEL
Cholesterol: 146 mg/dL (ref 0–200)
HDL: 56 mg/dL (ref >40)
LDL Cholesterol: 74 mg/dL (ref 0–99)
Total CHOL/HDL Ratio: 2.6 RATIO
Triglycerides: 81 mg/dL (ref <150)
VLDL: 16 mg/dL (ref 0–40)

## 2022-11-01 LAB — PHOSPHORUS: Phosphorus: 3.8 mg/dL (ref 2.5–4.6)

## 2022-11-01 LAB — MAGNESIUM: Magnesium: 2.2 mg/dL (ref 1.7–2.4)

## 2022-11-01 MED ORDER — ROSUVASTATIN CALCIUM 5 MG PO TABS: 10.0000 mg | ORAL_TABLET | Freq: Every day | ORAL | Status: DC

## 2022-11-01 MED ORDER — ROSUVASTATIN CALCIUM 5 MG PO TABS
10.0000 mg | ORAL_TABLET | Freq: Every day | ORAL | Status: DC
  Administered 2022-11-01 – 2022-11-02 (×2): 10 mg via ORAL
  Filled 2022-11-01 (×2): qty 2

## 2022-11-01 MED ORDER — CLOPIDOGREL BISULFATE 75 MG PO TABS
75.0000 mg | ORAL_TABLET | Freq: Every day | ORAL | Status: DC
  Administered 2022-11-02 – 2022-11-03 (×2): 75 mg via ORAL
  Filled 2022-11-01 (×3): qty 1

## 2022-11-01 NOTE — Progress Notes (Signed)
OT Cancellation Note  Patient Details Name: Tina Murray MRN: [id number] DOB: [birth year]   Cancelled Treatment:    Reason Eval/Treat Not Completed: Other (comment).  Patient with transfer order to [facility], will defer unless patient stays at [facility].  Phoebe Marter D Clodagh Odenthal 11/01/2022, 8:08 AM 11/01/2022  RP, OTR/L  Acute Rehabilitation Services  Office:  [phone]

## 2022-11-01 NOTE — ED Notes (Signed)
Report given to [place] with carelink

## 2022-11-01 NOTE — Plan of Care (Signed)
Patient still at [facility]. Will see on arrival at [facility].   -- Amie Portland, MD Neurologist

## 2022-11-01 NOTE — Progress Notes (Signed)
PROGRESS NOTE    Tina Murray  [id number] DOB: Jul 10, 1951 DOA: 10/31/2022 PCP: Lurline Del, DO  Outpatient Specialists:     Brief Narrative:  The patient is a 72 year old Caucasian female past medical history significant for chronic migraine, osteoporosis, hyperlipidemia and recent CVA on 10/12/2022.  Patient reported having migraine aura after the reported stroke of 10/12/2022.  Patient was discharged home on aspirin.  According to the patient, she had word finding problems, as well as, "disorderly speech".  Patient may have been discharged with Holter monitor but patient may not have been compliant.  Patient presented with an under brief episode of strokelike activity.  Patient also reported migraine symptoms currently.  CTA head and neck is nonrevealing.  Patient will be transferred to [facility] for further assessment and management.    Assessment & Plan:   Principal Problem:   Cerebrovascular accident (CVA) [facility]) Active Problems:   Anxiety   Hypercholesterolemia   CVA (cerebral vascular accident) ([place])   Cerebrovascular accident (CVA) ([place]) -Hx of recent CVA with punctate acute left temporoparietal infarct on 10/12/2022.  Has been on aspirin daily. -MRI brain revealed 2 adjacent punctate infarcts in the right frontal lobe. -CTA head and neck - nonrevealing. - -Echo on 10/15/22 with EF of 55-60% and normal diastolic parameters. No significant valvular abnormalities. -Patient will be transferred to [facility] for further workup and management by the neurology team. -Query patient may eventually need loop recorder placement i.e. if patient agrees. -continue daily aspirin and atorvastatin -A1c is still pending. -Lipid profile revealed LDL of 74, HDL of 56 and total cholesterol 146.   -PT/OT/SLT -Frequent neuro checks and keep on telemetry -Allow for permissive hypertension with blood pressure treatment as needed only if systolic goes above [id number]      Hypercholesterolemia -continue Rosuvastatin    Anxiety -pt very anxious regarding her diagnosis as she otherwise is healthy and avoids most medications   DVT prophylaxis: Lovenox Code Status: Full code Family Communication:  Disposition Plan: This will depend on hospital course   Consultants:  Neurology  Procedures:  None  Antimicrobials:  None   Subjective: No new complaints.  Objective: Vitals:   11/01/22 0556 11/01/22 0900 11/01/22 0944 11/01/22 1005  BP: 101/70 115/65  100/78  Pulse: 83 89  78  Resp: 18 17  14   Temp: 98.5 F (36.9 C)  97.9 F (36.6 C)   TempSrc: Oral  Oral   SpO2: 100% 100%  100%    Intake/Output Summary (Last 24 hours) at 11/01/2022 1300 Last data filed at 10/31/2022 2138 Gross per 24 hour  Intake 1000 ml  Output --  Net 1000 ml   There were no vitals filed for this visit.  Examination:  General exam: Appears calm and comfortable  Respiratory system: Clear to auscultation. Respiratory effort normal. Cardiovascular system: S1 & S2 heard,  Gastrointestinal system: Abdomen is nondistended, soft and nontender. No organomegaly or masses felt. Normal bowel sounds heard. Central nervous system: Alert and oriented. No focal neurological deficits. Extremities: No leg edema.    Data Reviewed: I have personally reviewed following labs and imaging studies  CBC: Recent Labs  Lab 10/31/22 1314  WBC 7.3  NEUTROABS 4.6  HGB 13.2  HCT 40.1  MCV 93.0  PLT 0000000   Basic Metabolic Panel: Recent Labs  Lab 10/31/22 1314  NA 140  K 3.8  CL 104  CO2 26  GLUCOSE 97  BUN 14  CREATININE 0.92  CALCIUM 9.4   GFR:  Estimated Creatinine Clearance: 46.8 mL/min (by C-G formula based on SCr of 0.92 mg/dL). Liver Function Tests: Recent Labs  Lab 10/31/22 1314  AST 23  ALT 17  ALKPHOS 55  BILITOT 0.6  PROT 7.2  ALBUMIN 4.6   No results for input(s): "LIPASE", "AMYLASE" in the last 168 hours. No results for input(s): "AMMONIA" in the  last 168 hours. Coagulation Profile: Recent Labs  Lab 10/31/22 1314  INR 1.0   Cardiac Enzymes: No results for input(s): "CKTOTAL", "CKMB", "CKMBINDEX", "TROPONINI" in the last 168 hours. BNP (last 3 results) No results for input(s): "PROBNP" in the last 8760 hours. HbA1C: No results for input(s): "HGBA1C" in the last 72 hours. CBG: Recent Labs  Lab 10/31/22 1257  GLUCAP 95   Lipid Profile: Recent Labs    11/01/22 0626  CHOL 146  HDL 56  LDLCALC 74  TRIG 81  CHOLHDL 2.6   Thyroid Function Tests: No results for input(s): "TSH", "T4TOTAL", "FREET4", "T3FREE", "THYROIDAB" in the last 72 hours. Anemia Panel: No results for input(s): "VITAMINB12", "FOLATE", "FERRITIN", "TIBC", "IRON", "RETICCTPCT" in the last 72 hours. Urine analysis: No results found for: "COLORURINE", "APPEARANCEUR", "LABSPEC", "PHURINE", "GLUCOSEU", "HGBUR", "BILIRUBINUR", "KETONESUR", "PROTEINUR", "UROBILINOGEN", "NITRITE", "LEUKOCYTESUR" Sepsis Labs: @LABRCNTIP (procalcitonin:4,lacticidven:4)  )No results found for this or any previous visit (from the past 240 hour(s)).       Radiology Studies: CT ANGIO HEAD NECK W WO CM  Result Date: 11/01/2022 CLINICAL DATA:  Follow-up examination for acute stroke. EXAM: CT ANGIOGRAPHY HEAD AND NECK TECHNIQUE: Multidetector CT imaging of the head and neck was performed using the standard protocol during bolus administration of intravenous contrast. Multiplanar CT image reconstructions and MIPs were obtained to evaluate the vascular anatomy. Carotid stenosis measurements (when applicable) are obtained utilizing NASCET criteria, using the distal internal carotid diameter as the denominator. RADIATION DOSE REDUCTION: This exam was performed according to the departmental dose-optimization program which includes automated exposure control, adjustment of the mA and/or kV according to patient size and/or use of iterative reconstruction technique. CONTRAST:  83mL OMNIPAQUE  IOHEXOL 350 MG/ML SOLN COMPARISON:  Prior MRI from earlier the same day. FINDINGS: CT HEAD FINDINGS Brain: Cerebral volume within normal limits. Moderate chronic microvascular ischemic disease again noted. Previously identified punctate right frontal infarcts not visible by CT. No other acute large vessel territory infarct. No acute intracranial hemorrhage. No mass lesion or midline shift. No hydrocephalus or extra-axial fluid collection. Vascular: No abnormal hyperdense vessel. Scattered vascular calcifications noted within the carotid siphons. Skull: Scalp soft tissues and calvarium demonstrate no acute finding. Sinuses/Orbits: Globes and orbital soft tissues within normal limits. Paranasal sinuses and mastoid air cells are clear. Other: None. Review of the MIP images confirms the above findings CTA NECK FINDINGS Aortic arch: Visualized aortic arch normal caliber with standard 3 vessel morphology. Atheromatous change present about the arch itself. No stenosis about the origin the great vessels. Right carotid system: Right common and internal carotid arteries are patent without stenosis, dissection or occlusion. Left carotid system: Left common and internal carotid arteries are patent without stenosis, dissection or occlusion. Vertebral arteries: Both vertebral arteries arise from subclavian arteries. No significant proximal subclavian artery stenosis. Vertebral arteries are patent without stenosis or dissection. Skeleton: No discrete or worrisome osseous lesions. Moderate cervical spondylosis at C3-4 through C6-7. Other neck: No other acute soft tissue abnormality within the neck. Upper chest: Irregular biapical pleuroparenchymal thickening about the lung apices with a few scattered superimposed nodular densities at the right lung apex, largest of which  measures 1.1 cm (series 9, image 138). Visualized upper chest demonstrates no other acute finding. Review of the MIP images confirms the above findings CTA HEAD  FINDINGS Anterior circulation: Both internal carotid arteries are patent to the termini without stenosis. A1 segments patent bilaterally. Normal anterior compared to complex. Anterior cerebral arteries patent without stenosis. No M1 stenosis or occlusion. Normal MCA bifurcations. No proximal MCA branch occlusion or high-grade stenosis. Distal MCA branches perfused and symmetric. Posterior circulation: Both V4 segments widely patent without stenosis. Both PICA patent. Basilar widely patent without stenosis. Superior cerebellar and posterior cerebral arteries patent bilaterally. Venous sinuses: Patent allowing for timing the contrast bolus. Anatomic variants: None significant.  No aneurysm. Review of the MIP images confirms the above findings IMPRESSION: CT HEAD IMPRESSION: 1. Previously identified punctate right frontal infarcts not visible by CT. No other acute intracranial abnormality. 2. Moderate chronic microvascular ischemic disease. CTA HEAD AND NECK IMPRESSION: 1. Negative CTA for large vessel occlusion or other emergent finding. 2. Mild for age atheromatous change about the major arterial vasculature of the head and neck. No hemodynamically significant or correctable stenosis. 3. Irregular biapical pleuroparenchymal thickening with a few scattered superimposed nodular densities at the right lung apex, largest of which measures 1.1 cm. Findings are incompletely assessed on this exam. Per Fleischner Society Guidelines, recommend prompt non-contrast Chest CT for further evaluation. Electronically Signed   By: Jeannine Boga M.D.   On: 11/01/2022 00:07   MR BRAIN WO CONTRAST  Result Date: 10/31/2022 CLINICAL DATA:  Expressive aphasia EXAM: MRI HEAD WITHOUT CONTRAST TECHNIQUE: Multiplanar, multiecho pulse sequences of the brain and surrounding structures were obtained without intravenous contrast. COMPARISON:  Brain MRI 10/18/2022 FINDINGS: Brain: There are two adjacent punctate infarcts in the right  frontal lobe anteromedially (5-18). The previously seen punctate infarct in the left temporoparietal region is no longer apparent. There is no acute intracranial hemorrhage or extra-axial fluid collection. Parenchymal volume is normal for age. The ventricles are normal in size. Patchy foci of FLAIR signal abnormality in the supratentorial white matter likely reflect sequela of chronic small-vessel ischemic change. The pituitary and suprasellar region are normal. There is no mass lesion. There is no mass effect or midline shift. Vascular: Normal flow voids. Skull and upper cervical spine: Normal marrow signal. Sinuses/Orbits: The paranasal sinuses are clear. Bilateral lens implants are in place. The globes and orbits are otherwise unremarkable. Other: None. IMPRESSION: Two adjacent punctate infarcts in the right frontal lobe. Electronically Signed   By: Valetta Mole M.D.   On: 10/31/2022 15:38        Scheduled Meds:   stroke: early stages of recovery book   Does not apply Once   aspirin EC  81 mg Oral Daily   enoxaparin (LOVENOX) injection  40 mg Subcutaneous Q24H   hydrOXYzine  25 mg Oral Once   loratadine  10 mg Oral Daily   rosuvastatin  10 mg Oral Daily   Continuous Infusions:  sodium chloride Stopped (10/31/22 2138)     LOS: 0 days    Time spent: 55 minutes.    Dana Allan, MD  [facility] Pager #: [phone] 7PM-7AM contact night coverage as above

## 2022-11-01 NOTE — Progress Notes (Signed)
[facility] night cross cover note:   I was notified by RN that this patient would prefer to hold off on initiation of Plavix today until she has had the opportunity to further discuss antiplatelet options with neurology tomorrow.  Consequently, will hold today's dose of Plavix, per patient request.     Babs Bertin, DO Hospitalist

## 2022-11-01 NOTE — Progress Notes (Signed)
  PT Cancellation Note  Patient Details Name: Ayanah Shumard MRN: [id number] DOB: [birth year]   Cancelled Treatment:    Reason Eval/Treat Not Completed: Other (comment)To transfer to [facility] with stroke diagnosis.  [place] Office [phone] Weekend [phone]    Claretha Cooper 11/01/2022, 9:39 AM

## 2022-11-01 NOTE — Consult Note (Addendum)
Neurology Consultation  Reason for Consult: Code Stroke Referring Physician: Tu  CC: Sudden onset of aphasia   History is obtained from: Patient  HPI: Tina Figuero is a 72 y.o. female with a past medical history of chronic migraine, osteoporosis, HLD, anxiety, and recent CVA on ASA 81mg  (seen by Dr. Krista Blue at [facility]) who presented to [facility] after a sudden onset of aphasia that lasted less than 5 minutes. She does describe palpitations. She reports a recent (around 2/28) vascular surgery in her right calf that was done with [facility]. She then reports that she had a sudden onset of speech difficulty and her PCP ordered an MRI brain which showed a an acute punctate stroke at the left temporal parietal region of the brain with evidence of small vessel disease.  She then followed up with [facility] neurology and was started on aspirin 81 mg daily.  Additionally she did have a cardiac monitor ordered by [facility] neurology, however the patient stated that she is not able to tolerate the sticky material on the leads due to her eczema and she mailed it back. On 3/21 she had another episode of expressive aphasia and MRI showed 2 adjacent punctate infarcts in the right frontal lobe.  Additionally she does have a history of chronic migraines with and ocular aura that she describes as colorful spinning.  She additionally has nausea with these.  She reports a migraine after first and second stroke and has been feeling shaky in the morning for the last week.  She denies any previous cardiac history and denies palpitations, dizziness, or recent illness.  She does have increased anxiety regarding her husband's health.   LKW: 1130 3/21 TNK given?: No Premorbid modified Rankin scale (mRS):  1-No significant post stroke disability and can perform usual duties with stroke symptoms    ROS: Full ROS was performed and is negative except as noted in the HPI.   Past Medical History:  Diagnosis Date   DDD  (degenerative disc disease), lumbar    GAD (generalized anxiety disorder)    Headache    Hypercholesterolemia    Hyperglycemia    Hyperlipemia    Lumbar radiculopathy    Migraine    Osteoarthritis    Osteoporosis    Paresthesia of foot    right   Sacroiliac joint pain    Sciatica of right side    Scoliosis    Seasonal allergies     Family History  Problem Relation Age of Onset   Stroke Mother        age 69   COPD Mother    Other Father        age 40 - complications from surgery   Hyperlipidemia Brother    Heart attack Brother        age 66    Social History:   reports that she quit smoking about 43 years ago. Her smoking use included cigarettes. She has a 2.00 pack-year smoking history. She has been exposed to tobacco smoke. She has never used smokeless tobacco. She reports current alcohol use. She reports that she does not use drugs.  Medications  Current Facility-Administered Medications:     stroke: early stages of recovery book, , Does not apply, Once, Tu, Ching T, DO   [COMPLETED] sodium chloride 0.9 % bolus 500 mL, 500 mL, Intravenous, Once, Stopped at 10/31/22 1550 **FOLLOWED BY** 0.9 %  sodium chloride infusion, 100 mL/hr, Intravenous, Continuous, Carmin Muskrat, MD, Stopped at 10/31/22 2138   acetaminophen (  TYLENOL) tablet 650 mg, 650 mg, Oral, Q4H PRN, 650 mg at 11/01/22 1316 **OR** acetaminophen (TYLENOL) 160 MG/5ML solution 650 mg, 650 mg, Per Tube, Q4H PRN **OR** acetaminophen (TYLENOL) suppository 650 mg, 650 mg, Rectal, Q4H PRN, Tu, Ching T, DO   aspirin EC tablet 81 mg, 81 mg, Oral, Daily, Tu, Ching T, DO, 81 mg at 11/01/22 1006   enoxaparin (LOVENOX) injection 40 mg, 40 mg, Subcutaneous, Q24H, Tu, Ching T, DO   hydrOXYzine (ATARAX) tablet 25 mg, 25 mg, Oral, Once, Tu, Ching T, DO   loratadine (CLARITIN) tablet 10 mg, 10 mg, Oral, Daily, Tu, Ching T, DO, 10 mg at 11/01/22 1007   rosuvastatin (CRESTOR) tablet 10 mg, 10 mg, Oral, Daily, Tu, Ching T,  DO   Exam: Current vital signs: BP 102/84 (BP Location: Left Arm)   Pulse 85   Temp 97.8 F (36.6 C) (Oral)   Resp 18   SpO2 100%  Vital signs in last 24 hours: Temp:  [97.8 F (36.6 C)-98.6 F (37 C)] 97.8 F (36.6 C) (03/22 1407) Pulse Rate:  [64-97] 85 (03/22 1407) Resp:  [14-20] 18 (03/22 1407) BP: (100-139)/(65-94) 102/84 (03/22 1407) SpO2:  [86 %-100 %] 100 % (03/22 1407)  GENERAL: Awake, alert in NAD HEENT: - Normocephalic and atraumatic, dry mm, no LN++, no Thyromegally LUNGS - Clear to auscultation bilaterally with no wheezes CV - S1S2 RRR, no m/r/g, equal pulses bilaterally. ABDOMEN - Soft, nontender, nondistended with normoactive BS Ext: warm, well perfused, intact peripheral pulses, no edema  NEURO:  Mental Status: AA&Ox3  Language: speech is clear.  Naming, repetition, fluency, and comprehension intact. Cranial Nerves: PERRL mm/brisk. EOMI, visual fields full, no facial asymmetry, facial sensation intact, hearing intact, tongue/uvula/soft palate midline, normal sternocleidomastoid and trapezius muscle strength. No evidence of tongue atrophy or fasciculations Motor: Moves all extremities antigravity  Tone: is normal and bulk is normal Sensation- Intact to light touch bilaterally Coordination: FTN intact bilaterally, no ataxia in BLE. Gait- deferred    NIHSS components Score: Comment  1a Level of Conscious 0[x]  1[]  2[]  3[]         1b LOC Questions 0[x]  1[]  2[]           1c LOC Commands 0[x]  1[]  2[]           2 Best Gaze 0[x]  1[]  2[]           3 Visual 0[x]  1[]  2[]  3[]         4 Facial Palsy 0[x]  1[]  2[]  3[]         5a Motor Arm - left 0[x]  1[]  2[]  3[]  4[]  UN[]     5b Motor Arm - Right 0[x]  1[]  2[]  3[]  4[]  UN[]     6a Motor Leg - Left 0[x]  1[]  2[]  3[]  4[]  UN[]     6b Motor Leg - Right 0[x]  1[]  2[]  3[]  4[]  UN[]     7 Limb Ataxia 0[x]  1[]  2[]  3[]  UN[]       8 Sensory 0[x]  1[]  2[]  UN[]         9 Best Language 0[x]  1[]  2[]  3[]         10 Dysarthria 0[x]  1[]  2[]  UN[]          11 Extinct. and Inattention 0[x]  1[]  2[]           TOTAL: 0        Labs I have reviewed labs in epic and the results pertinent to this consultation are:  CBC    Component Value Date/Time   WBC 7.3 10/31/2022 1314  RBC 4.31 10/31/2022 1314   HGB 13.2 10/31/2022 1314   HCT 40.1 10/31/2022 1314   PLT 318 10/31/2022 1314   MCV 93.0 10/31/2022 1314   MCH 30.6 10/31/2022 1314   MCHC 32.9 10/31/2022 1314   RDW 13.4 10/31/2022 1314   LYMPHSABS 2.2 10/31/2022 1314   MONOABS 0.5 10/31/2022 1314   EOSABS 0.1 10/31/2022 1314   BASOSABS 0.1 10/31/2022 1314    CMP     Component Value Date/Time   NA 140 10/31/2022 1314   K 3.8 10/31/2022 1314   CL 104 10/31/2022 1314   CO2 26 10/31/2022 1314   GLUCOSE 97 10/31/2022 1314   BUN 14 10/31/2022 1314   CREATININE 0.92 10/31/2022 1314   CREATININE 0.90 08/21/2022 1148   CALCIUM 9.4 10/31/2022 1314   PROT 7.2 10/31/2022 1314   ALBUMIN 4.6 10/31/2022 1314   AST 23 10/31/2022 1314   ALT 17 10/31/2022 1314   ALKPHOS 55 10/31/2022 1314   BILITOT 0.6 10/31/2022 1314   GFRNONAA >60 10/31/2022 1314    Lipid Panel     Component Value Date/Time   CHOL 146 11/01/2022 0626   TRIG 81 11/01/2022 0626   HDL 56 11/01/2022 0626   CHOLHDL 2.6 11/01/2022 0626   VLDL 16 11/01/2022 0626   LDLCALC 74 11/01/2022 0626     Imaging I have reviewed the images obtained:  CT HEAD-  Previously identified punctate right frontal infarcts not visible by CT. No other acute intracranial abnormality. Moderate chronic microvascular ischemic disease.   CTA HEAD AND NECK IMPRESSION- Negative CTA for large vessel occlusion or other emergent finding.Mild for age atheromatous change about the major arterial vasculature of the head and neck. No hemodynamically significant or correctable stenosis.  MRI Brain 10/18/2022 - Punctate acute left temporoparietal infarct.  MRI brain 10/31/2022 - Two adjacent punctate infarcts in the right frontal lobe.   Echocardiogram  10/15/2022 - EF 55 to 60%, bilateral atria normal in size  Assessment:  72 y.o. female with a past medical history of chronic migraine, osteoporosis, HLD, and recent CVA on ASA 81mg  (seen by Dr. Krista Blue at [facility]) who presented to [facility] after a sudden onset of aphasia that lasted less than 5 minutes.  She does not currently have any symptoms.  Prior stroke in the left temporal/parietal area earlier this month, most recent MRI on 10/31/2022 with punctate infarct in the right frontal lobe-suspect cardioembolic etiology.  Impression: Two adjacent punctate infarcts in the right frontal lobe.   Recommendations: - 30 day monitor vs loop recorder - HgbA1c, fasting lipid panel - Frequent neuro checks - Venous duplex - Prophylactic therapy aspirin 81 mg daily and Plavix 75 mg daily - Risk factor modification - Telemetry monitoring - PT consult, OT consult, Speech consult - Stroke team to follow   Patient seen and examined by NP/APP with MD. MD to update note as needed.   Janine Ores, DNP, FNP-BC [facility] Pager: [phone]  Attending Neurohospitalist Addendum Patient seen and examined with APP/Resident. Agree with the history and physical as documented above. Agree with the plan as documented, which I helped formulate. I have independently reviewed the chart, obtained history, review of systems and examined the patient.I have personally reviewed pertinent head/neck/spine imaging (CT/MRI). Please feel free to call with any questions.  -- Amie Portland, MD Neurologist [facility] Pager: [phone]

## 2022-11-01 NOTE — ED Notes (Signed)
[facility] called for transport. 

## 2022-11-01 NOTE — Progress Notes (Addendum)
Patient is currently at [facility] and already seen by my partner and colleague Dr. Dana Allan and I am in agreement with his assessment and plan.  Awaiting transfer to [facility] for Stroke Workup and evaluation. Neurology to evaluate further once patient arrives at [facility].  MRI showed 2 adjacent punctate infarcts in right frontal lobe.  Will repeat blood work and follow-up on neurology recommendations once the patient arrives at [facility].

## 2022-11-02 ENCOUNTER — Inpatient Hospital Stay (HOSPITAL_COMMUNITY): Payer: Medicare Other

## 2022-11-02 DIAGNOSIS — R4182 Altered mental status, unspecified: Secondary | ICD-10-CM | POA: Diagnosis not present

## 2022-11-02 DIAGNOSIS — I6300 Cerebral infarction due to thrombosis of unspecified precerebral artery: Secondary | ICD-10-CM | POA: Diagnosis not present

## 2022-11-02 DIAGNOSIS — G464 Cerebellar stroke syndrome: Secondary | ICD-10-CM

## 2022-11-02 DIAGNOSIS — I639 Cerebral infarction, unspecified: Secondary | ICD-10-CM | POA: Diagnosis not present

## 2022-11-02 DIAGNOSIS — F419 Anxiety disorder, unspecified: Secondary | ICD-10-CM | POA: Diagnosis not present

## 2022-11-02 DIAGNOSIS — E7800 Pure hypercholesterolemia, unspecified: Secondary | ICD-10-CM | POA: Diagnosis not present

## 2022-11-02 LAB — CBC WITH DIFFERENTIAL/PLATELET
Abs Immature Granulocytes: 0.01 10*3/uL (ref 0.00–0.07)
Basophils Absolute: 0.1 10*3/uL (ref 0.0–0.1)
Basophils Relative: 1 %
Eosinophils Absolute: 0.1 10*3/uL (ref 0.0–0.5)
Eosinophils Relative: 2 %
HCT: 37.2 % (ref 36.0–46.0)
Hemoglobin: 12.2 g/dL (ref 12.0–15.0)
Immature Granulocytes: 0 %
Lymphocytes Relative: 47 %
Lymphs Abs: 2.7 10*3/uL (ref 0.7–4.0)
MCH: 30.6 pg (ref 26.0–34.0)
MCHC: 32.8 g/dL (ref 30.0–36.0)
MCV: 93.2 fL (ref 80.0–100.0)
Monocytes Absolute: 0.6 10*3/uL (ref 0.1–1.0)
Monocytes Relative: 10 %
Neutro Abs: 2.4 10*3/uL (ref 1.7–7.7)
Neutrophils Relative %: 40 %
Platelets: 262 10*3/uL (ref 150–400)
RBC: 3.99 MIL/uL (ref 3.87–5.11)
RDW: 13.4 % (ref 11.5–15.5)
WBC: 5.8 10*3/uL (ref 4.0–10.5)
nRBC: 0 % (ref 0.0–0.2)

## 2022-11-02 LAB — COMPREHENSIVE METABOLIC PANEL
ALT: 14 U/L (ref 0–44)
AST: 18 U/L (ref 15–41)
Albumin: 3.5 g/dL (ref 3.5–5.0)
Alkaline Phosphatase: 51 U/L (ref 38–126)
Anion gap: 7 (ref 5–15)
BUN: 13 mg/dL (ref 8–23)
CO2: 23 mmol/L (ref 22–32)
Calcium: 8.4 mg/dL — ABNORMAL LOW (ref 8.9–10.3)
Chloride: 110 mmol/L (ref 98–111)
Creatinine, Ser: 0.79 mg/dL (ref 0.44–1.00)
GFR, Estimated: >60 mL/min (ref >60)
Glucose, Bld: 97 mg/dL (ref 70–99)
Potassium: 3.9 mmol/L (ref 3.5–5.1)
Sodium: 140 mmol/L (ref 135–145)
Total Bilirubin: 0.4 mg/dL (ref 0.3–1.2)
Total Protein: 5.9 g/dL — ABNORMAL LOW (ref 6.5–8.1)

## 2022-11-02 LAB — MAGNESIUM: Magnesium: 2.2 mg/dL (ref 1.7–2.4)

## 2022-11-02 LAB — PHOSPHORUS: Phosphorus: 4.1 mg/dL (ref 2.5–4.6)

## 2022-11-02 LAB — HEMOGLOBIN A1C
Hgb A1c MFr Bld: 5.8 % — ABNORMAL HIGH (ref 4.8–5.6)
Mean Plasma Glucose: 120 mg/dL

## 2022-11-02 NOTE — Progress Notes (Addendum)
STROKE TEAM PROGRESS NOTE   INTERVAL HISTORY Her husband is at the bedside.  She is awake and alert in NAD. She reports having 2 seperate episodes of speaking jibberish lasting about 3-4 minutes.  3 weeks apart EEG normal.  MRI scan shows 2 tiny punctate right medial frontal embolic strokes.  3 weeks ago MRI had shown small left temporal embolic infarct Recommend loop recorder at discharge. ASA 81 mg and Plavix 75mg  daily for 3 weeks than ASA alone  Patient had history of right leg varicose vein treatment procedure done by Dr. Jones Skene and symptoms began about 5 days after this raising concern for possible paradoxical embolism. Vitals:   11/02/22 0354 11/02/22 0817 11/02/22 1050 11/02/22 1204  BP: 99/63 113/79  115/84  Pulse: 66 79  91  Resp: 18 20  20   Temp: 97.9 F (36.6 C) (!) 97.5 F (36.4 C)  97.8 F (36.6 C)  TempSrc: Oral Oral  Oral  SpO2: 99% 100%  100%  Height:   5\' 5"  (1.651 m)    CBC:  Recent Labs  Lab 11/01/22 1513 11/02/22 0159  WBC 7.2 5.8  NEUTROABS 4.6 2.4  HGB 14.1 12.2  HCT 43.1 37.2  MCV 93.9 93.2  PLT 308 99991111   Basic Metabolic Panel:  Recent Labs  Lab 11/01/22 1513 11/02/22 0159  NA 138 140  K 3.5 3.9  CL 105 110  CO2 23 23  GLUCOSE 107* 97  BUN 14 13  CREATININE 0.99 0.79  CALCIUM 9.3 8.4*  MG 2.2 2.2  PHOS 3.8 4.1   Lipid Panel:  Recent Labs  Lab 11/01/22 0626  CHOL 146  TRIG 81  HDL 56  CHOLHDL 2.6  VLDL 16  LDLCALC 74   HgbA1c:  Recent Labs  Lab 11/01/22 0626  HGBA1C 5.8*   Urine Drug Screen: No results for input(s): "LABOPIA", "COCAINSCRNUR", "LABBENZ", "AMPHETMU", "THCU", "LABBARB" in the last 168 hours.  Alcohol Level  Recent Labs  Lab 10/31/22 1314  ETH <10    IMAGING past 24 hours EEG adult  Result Date: 11/02/2022 Lora Havens, MD     11/02/2022 11:46 AM Patient Name: Tina Murray MRN: [id number] Epilepsy Attending: Lora Havens Referring Physician/Provider: August Albino, NP Date: 11/02/2022 Duration:  25.16 mins Patient history: 72yo F with sudden onset aphasia. EEG to evaluate for seizure Level of alertness: Awake AEDs during EEG study: None Technical aspects: This EEG study was done with scalp electrodes positioned according to the 10-20 International system of electrode placement. Electrical activity was reviewed with band pass filter of 1-70Hz , sensitivity of 7 uV/mm, display speed of 47mm/sec with a 60Hz  notched filter applied as appropriate. EEG data were recorded continuously and digitally stored.  Video monitoring was available and reviewed as appropriate. Description: The posterior dominant rhythm consists of 10 Hz activity of moderate voltage (25-35 uV) seen predominantly in posterior head regions, symmetric and reactive to eye opening and eye closing. Hyperventilation and photic stimulation were not performed.   IMPRESSION: This study is within normal limits. No seizures or epileptiform discharges were seen throughout the recording. A normal interictal EEG does not exclude the diagnosis of epilepsy. [place]    PHYSICAL EXAM GENERAL: Awake, alert in NAD anxious middle-aged Caucasian lady HEENT: - Normocephalic and atraumatic, dry mm LUNGS - Clear to auscultation bilaterally with no wheezes CV - S1S2 RRR, no m/r/g, equal pulses bilaterally. ABDOMEN - Soft, nontender, nondistended with normoactive BS Ext: warm, well perfused, intact peripheral pulses, no  edema   NEURO:  Mental Status: AA&Ox3  Language: speech is clear.  Naming, repetition, fluency, and comprehension intact. Cranial Nerves: PERRL mm/brisk. EOMI, visual fields full, no facial asymmetry, facial sensation intact, hearing intact, tongue/uvula/soft palate midline, normal sternocleidomastoid and trapezius muscle strength. No evidence of tongue atrophy or fasciculations Motor: Moves all extremities antigravity  Tone: is normal and bulk is normal Sensation- Intact to light touch bilaterally Coordination: FTN intact  bilaterally, no ataxia in BLE. Gait- deferred  ASSESSMENT/PLAN [name] Current is a 71 y.o. female with history of past medical history of chronic migraine, osteoporosis, HLD, and recent CVA on ASA 81mg  (seen by Dr. Krista Blue at [facility]) who presented to [facility] after a sudden onset of aphasia that lasted less than 5 minutes.  She does not currently have any symptoms.   Stroke:  Two adjacent punctate infarcts in the right frontal lobe.  Etiology:   embolic  Code Stroke CT head Previously identified punctate right frontal infarcts not visible by CT. No other acute intracranial abnormality. Moderate chronic microvascular ischemic disease.  CTA head & neck Negative CTA for large vessel occlusion or other emergent finding.Mild for age atheromatous change about the major arterial vasculature of the head and neck. No hemodynamically significant or correctable stenosis.  MRI  Two adjacent punctate infarcts in the right frontal lobe TCD Bubble Study- pending 2D Echo EF 55 to 60%, bilateral atria normal in size  [place] LLE pending  LDL 74 HgbA1c 5.8 VTE prophylaxis - Lovenox     Diet   Diet regular Room service appropriate? Yes; Fluid consistency: Thin   aspirin 81 mg daily prior to admission, now on aspirin 81 mg daily and clopidogrel 75 mg daily. \ Therapy recommendations:  pending Disposition:  pending  Hypertension Home meds:  none Stable Permissive hypertension (OK if < 220/120) but gradually normalize in 5-7 days Long-term BP goal normotensive  Hyperlipidemia Home meds:  crestor 10mg , resumed in hospital LDL 74, goal < 70 Continue statin at discharge  Other Stroke Risk Factors Advanced Age >/= 76  Hx stroke/TIA  Other Active Problems GERD  Hospital day # 1  Beulah Gandy DNP, ACNPC-AG  [facility]  I have personally obtained history,examined this patient, reviewed notes, independently viewed imaging studies, participated in medical decision making and plan of care.ROS completed  by me personally and pertinent positives fully documented  I have made any additions or clarifications directly to the above note. Agree with note above.  Patient has had 2 transient episodes of gibberish speech and expressive aphasia 3 weeks apart and previous MRI had shown punctate left temporal infarct and the current one shows 2 tiny right medial frontal punctate embolic infarcts as well.  Recommend check TCD bubble study for PFO since symptoms began 5 days after a varicose vein procedure.  If TCD bubble study is negative will need prolonged cardiac monitoring for paroxysmal A-fib at discharge.  Recommend aspirin and Plavix for 3 weeks followed by aspirin alone and aggressive risk factor modification.  Long discussion with the patient and husband at the bedside and answered questions.  Discussed with Dr. Zollie Scale.  Greater than 50% time during this 50-minute visit was spent on counseling and coordination of care about her embolic stroke and discussion about evaluation and treatment and answering questions.  Antony Contras, MD Medical Director [facility] Stroke [facility] Pager: [phone] 11/02/2022 4:36 PM   To contact Stroke Continuity provider, please refer to [email or web address] After hours, contact General Neurology

## 2022-11-02 NOTE — Progress Notes (Signed)
PT Cancellation & Discharge Note  Patient Details Name: Tina Murray MRN: [id number] DOB: [birth year]   Cancelled Treatment:    Reason Eval/Treat Not Completed: (P) PT screened, no needs identified, will sign off. Discussed pt with OT, who reported pt was independent with functional mobility with no apparent balance deficits or lower extremity asymmetry/deficits. No acute PT needs identified, will sign off.   Moishe Spice, PT, DPT Acute Rehabilitation Services  Office: [place] 11/02/2022, 10:21 AM

## 2022-11-02 NOTE — Progress Notes (Signed)
VASCULAR LAB    TCD bubble study has been performed.  See CV proc for preliminary results.   Alaura Schippers, RVT 11/02/2022, 4:56 PM

## 2022-11-02 NOTE — Progress Notes (Signed)
SLP Cancellation Note  Patient Details Name: Tina Murray MRN: [id number] DOB: May 18, 1951   Cancelled treatment:       Reason Eval/Treat Not Completed: SLP screened, no needs identified, will sign off; pt stated her symptoms have resolved and kindly declined SLE.     Pat Josue Falconi,M.S., CCC-SLP 11/02/2022, 3:55 PM

## 2022-11-02 NOTE — Progress Notes (Signed)
EEG complete - results pending 

## 2022-11-02 NOTE — Progress Notes (Signed)
PROGRESS NOTE    Tina Murray  [id number] DOB: Aug 03, 1951 DOA: 10/31/2022 PCP: Lurline Del, DO   Brief Narrative:  The patient is a 72 year old Caucasian female past medical history significant for chronic migraine, osteoporosis, hyperlipidemia and recent CVA on 10/12/2022.  Patient reported having migraine aura after the reported stroke of 10/12/2022.  Patient was discharged home on aspirin.  According to the patient, she had word finding problems, as well as, "disorderly speech".  Patient may have been discharged with Holter monitor but patient may not have been compliant.  Patient presented with an under brief episode of strokelike activity.  Patient also reported migraine symptoms currently.  CTA head and neck is nonrevealing.  Patient will be transferred to [facility] for further assessment and management.   **She was seen by the neurology team who recommends a loop recorder at discharge with the patient does not want as well as aspirin 81 mg and Plavix send 5 mg daily for 3 weeks and then just aspirin alone but patient does not want take the Plavix.  Neurology recommending further workup with a TCD bubble study which was done and negative however there is concern for possible atrial fibrillation so I reached out to the Cards fellow to evaluate to see if she has true atrial fibrillation and if she does a formal consult will be placed.[name]  EEG was done which showed that the study was within normal limits with no seizures or epileptiform discharges seen throughout the reading.  PT OT and SLP recommending no follow-up currently.  Patient was found to have an abnormality on her CT scan of the head and neck until radiologist had recommended a CT scan of the chest to evaluate without contrast and this is being ordered.  Assessment and Plan:  Acute Right Frontal Lobe adjacent punctate infarcts  -Hx of recent CVA with punctate acute left temporoparietal infarct on 10/12/2022.  Has been on  aspirin daily. -MRI brain revealed 2 adjacent punctate infarcts in the right frontal lobe. -CTA head and neck done and showed "CT HEAD IMPRESSION:   1. Previously identified punctate right frontal infarcts not visible by CT. No other acute intracranial abnormality. 2. Moderate chronic microvascular ischemic disease.   CTA HEAD AND NECK IMPRESSION:   1. Negative CTA for large vessel occlusion or other emergent finding. 2. Mild for age atheromatous change about the major arterial vasculature of the head and neck. No hemodynamically significant or correctable stenosis. 3. Irregular biapical pleuroparenchymal thickening with a few scattered superimposed nodular densities at the right lung apex, largest of which measures 1.1 cm. Findings are incompletely assessed on this exam. Per Fleischner Society Guidelines, recommend prompt non-contrast Chest CT for further evaluation." -Echo on 10/15/22 with EF of 55-60% and normal diastolic parameters. No significant valvular abnormalities. -Patient will be transferred to [facility] for further workup and management by the neurology team. -Neurology recommending a loop recorder however patient is declining this and now they feel that she will need prolonged cardiac monitoring if TCD bubble study is negative to evaluate for proximal atrial fibrillation at discharge and will need a 30-day monitor at least -TCD bubble study was negative however neurology felt that she may have had a short run of A-fib so asked to see if EP could evaluate the rhythm strip or cardiology evaluate.  Have notified the cards fellow Dr. Nira Retort and will evaluate to see if formal consult is needed or not: If A-fib is confirmed she will need anticoagulation; Dr. Nira Retort  does not feel that this is atrial fibrillation and agrees with the 30-day monitor -Neurology is recommending Aspirin and Plavix daily for 3 weeks and then just followed by aspirin alone as well as aggressive risk  factor modification however patient is refusing the Plavix currently. -A1c was 5.8 -Lower Extremity venous duplex was negative for DVT -EEG done and showed that the study was within normal limits with no seizures or epileptiform discharges seen throughout the recording  -Lipid profile revealed LDL of 74, HDL of 56 and total cholesterol 146.   -PT/OT/SLP recommending no follow-up -Frequent neuro checks and keep on telemetry for now -Allow for permissive hypertension with blood pressure treatment as needed only if systolic goes above [id number]  Abnormal Right Lung Density/Thickening -CTA of the head and neck showed "Irregular biapical pleuroparenchymal thickening with a few scattered superimposed nodular densities at the right lung apex, largest of which measures 1.1 cm. Findings are incompletely assessed on this exam." -Will obtain a CT scan of the chest without contrast for further evaluation  Hypercholesterolemia -Lipid Panel done and showed a total cholesterol/HDL ratio 2.6, cholesterol level 146, HDL 56, LDL 74, triglycerides of 81, VLDL 16 -Continue Rosuvastatin 10 mg- pt reports higher dose makes her "ill"  Anxiety -Pt very anxious regarding her diagnosis as she otherwise is healthy and avoids most medications  Prediabetes  -HbA1c is now 5.8 -Continue to Monitor CBGs per protocol and if necessary will place on Sensitive Noviolog SSI AC Recent Labs  Lab 10/31/22 1257  GLUCAP 95   DVT prophylaxis: enoxaparin (LOVENOX) injection 40 mg Start: 11/01/22 1000    Code Status: Full Code Family Communication: Discussed with husband at bedside  Disposition Plan:  Level of care: Telemetry Medical Status is: Inpatient Remains inpatient appropriate because: His further clinical improvement and workup and anticipating discharge in the next 24 to 48 hours   Consultants:  Neurology Discussed the case with the cardiology fellow  Procedures:  As delineated as above  Antimicrobials:   Anti-infectives (From admission, onward)    None       Subjective: Seen and examined at bedside and the patient was doing okay.  She is very anxious and did not really want to take Plavix.  Undergoing stroke workup and stroke workup was completed however she had an abnormality on her CT head and neck scan so we will be getting a CT scan of her chest.  Objective: Vitals:   11/01/22 2035 11/01/22 2354 11/02/22 0354 11/02/22 0817  BP: 123/75 100/71 99/63 113/79  Pulse: 68 (!) 59 66 79  Resp: 18 16 18 20   Temp: 97.6 F (36.4 C) 98 F (36.7 C) 97.9 F (36.6 C) (!) 97.5 F (36.4 C)  TempSrc: Oral  Oral Oral  SpO2: 100% 97% 99% 100%   No intake or output data in the 24 hours ending 11/02/22 0823 There were no vitals filed for this visit.  Examination: Physical Exam:  Constitutional: WN/WD Caucasian female in no acute distress but appears anxious Respiratory: Diminished to auscultation bilaterally, no wheezing, rales, rhonchi or crackles. Normal respiratory effort and patient is not tachypenic. No accessory muscle use.  Unlabored breathing Cardiovascular: RRR, no murmurs / rubs / gallops. S1 and S2 auscultated. No extremity edema. Abdomen: Soft, non-tender, nondistended. Bowel sounds positive.  GU: Deferred. Musculoskeletal: No clubbing / cyanosis of digits/nails. No joint deformity upper and lower extremities Skin: No rashes, lesions, ulcers on a limited skin evaluation. No induration; Warm and dry.  Neurologic: CN 2-12 grossly intact  with no focal deficits. Romberg sign and cerebellar reflexes not assessed.  Psychiatric: Normal judgment and insight. Alert and oriented x 3. Normal mood and appropriate affect.   Data Reviewed: I have personally reviewed following labs and imaging studies  CBC: Recent Labs  Lab 10/31/22 1314 11/01/22 1513 11/02/22 0159  WBC 7.3 7.2 5.8  NEUTROABS 4.6 4.6 2.4  HGB 13.2 14.1 12.2  HCT 40.1 43.1 37.2  MCV 93.0 93.9 93.2  PLT 318 308 [id number]    Basic Metabolic Panel: Recent Labs  Lab 10/31/22 1314 11/01/22 1513 11/02/22 0159  NA 140 138 140  K 3.8 3.5 3.9  CL 104 105 110  CO2 26 23 23   GLUCOSE 97 107* 97  BUN 14 14 13   CREATININE 0.92 0.99 0.79  CALCIUM 9.4 9.3 8.4*  MG  --  2.2 2.2  PHOS  --  3.8 4.1   GFR: Estimated Creatinine Clearance: 53.8 mL/min (by C-G formula based on SCr of 0.79 mg/dL). Liver Function Tests: Recent Labs  Lab 10/31/22 1314 11/01/22 1513 11/02/22 0159  AST 23 24 18   ALT 17 18 14   ALKPHOS 55 60 51  BILITOT 0.6 0.6 0.4  PROT 7.2 7.2 5.9*  ALBUMIN 4.6 4.4 3.5   No results for input(s): "LIPASE", "AMYLASE" in the last 168 hours. No results for input(s): "AMMONIA" in the last 168 hours. Coagulation Profile: Recent Labs  Lab 10/31/22 1314  INR 1.0   Cardiac Enzymes: No results for input(s): "CKTOTAL", "CKMB", "CKMBINDEX", "TROPONINI" in the last 168 hours. BNP (last 3 results) No results for input(s): "PROBNP" in the last 8760 hours. HbA1C: Recent Labs    11/01/22 0626  HGBA1C 5.8*   CBG: Recent Labs  Lab 10/31/22 1257  GLUCAP 95   Lipid Profile: Recent Labs    11/01/22 0626  CHOL 146  HDL 56  LDLCALC 74  TRIG 81  CHOLHDL 2.6   Thyroid Function Tests: No results for input(s): "TSH", "T4TOTAL", "FREET4", "T3FREE", "THYROIDAB" in the last 72 hours. Anemia Panel: No results for input(s): "VITAMINB12", "FOLATE", "FERRITIN", "TIBC", "IRON", "RETICCTPCT" in the last 72 hours. Sepsis Labs: No results for input(s): "PROCALCITON", "LATICACIDVEN" in the last 168 hours.  No results found for this or any previous visit (from the past 240 hour(s)).   Radiology Studies: CT ANGIO HEAD NECK W WO CM  Result Date: 11/01/2022 CLINICAL DATA:  Follow-up examination for acute stroke. EXAM: CT ANGIOGRAPHY HEAD AND NECK TECHNIQUE: Multidetector CT imaging of the head and neck was performed using the standard protocol during bolus administration of intravenous contrast. Multiplanar  CT image reconstructions and MIPs were obtained to evaluate the vascular anatomy. Carotid stenosis measurements (when applicable) are obtained utilizing NASCET criteria, using the distal internal carotid diameter as the denominator. RADIATION DOSE REDUCTION: This exam was performed according to the departmental dose-optimization program which includes automated exposure control, adjustment of the mA and/or kV according to patient size and/or use of iterative reconstruction technique. CONTRAST:  59mL OMNIPAQUE IOHEXOL 350 MG/ML SOLN COMPARISON:  Prior MRI from earlier the same day. FINDINGS: CT HEAD FINDINGS Brain: Cerebral volume within normal limits. Moderate chronic microvascular ischemic disease again noted. Previously identified punctate right frontal infarcts not visible by CT. No other acute large vessel territory infarct. No acute intracranial hemorrhage. No mass lesion or midline shift. No hydrocephalus or extra-axial fluid collection. Vascular: No abnormal hyperdense vessel. Scattered vascular calcifications noted within the carotid siphons. Skull: Scalp soft tissues and calvarium demonstrate no acute finding. Sinuses/Orbits: Globes and  orbital soft tissues within normal limits. Paranasal sinuses and mastoid air cells are clear. Other: None. Review of the MIP images confirms the above findings CTA NECK FINDINGS Aortic arch: Visualized aortic arch normal caliber with standard 3 vessel morphology. Atheromatous change present about the arch itself. No stenosis about the origin the great vessels. Right carotid system: Right common and internal carotid arteries are patent without stenosis, dissection or occlusion. Left carotid system: Left common and internal carotid arteries are patent without stenosis, dissection or occlusion. Vertebral arteries: Both vertebral arteries arise from subclavian arteries. No significant proximal subclavian artery stenosis. Vertebral arteries are patent without stenosis or  dissection. Skeleton: No discrete or worrisome osseous lesions. Moderate cervical spondylosis at C3-4 through C6-7. Other neck: No other acute soft tissue abnormality within the neck. Upper chest: Irregular biapical pleuroparenchymal thickening about the lung apices with a few scattered superimposed nodular densities at the right lung apex, largest of which measures 1.1 cm (series 9, image 138). Visualized upper chest demonstrates no other acute finding. Review of the MIP images confirms the above findings CTA HEAD FINDINGS Anterior circulation: Both internal carotid arteries are patent to the termini without stenosis. A1 segments patent bilaterally. Normal anterior compared to complex. Anterior cerebral arteries patent without stenosis. No M1 stenosis or occlusion. Normal MCA bifurcations. No proximal MCA branch occlusion or high-grade stenosis. Distal MCA branches perfused and symmetric. Posterior circulation: Both V4 segments widely patent without stenosis. Both PICA patent. Basilar widely patent without stenosis. Superior cerebellar and posterior cerebral arteries patent bilaterally. Venous sinuses: Patent allowing for timing the contrast bolus. Anatomic variants: None significant.  No aneurysm. Review of the MIP images confirms the above findings IMPRESSION: CT HEAD IMPRESSION: 1. Previously identified punctate right frontal infarcts not visible by CT. No other acute intracranial abnormality. 2. Moderate chronic microvascular ischemic disease. CTA HEAD AND NECK IMPRESSION: 1. Negative CTA for large vessel occlusion or other emergent finding. 2. Mild for age atheromatous change about the major arterial vasculature of the head and neck. No hemodynamically significant or correctable stenosis. 3. Irregular biapical pleuroparenchymal thickening with a few scattered superimposed nodular densities at the right lung apex, largest of which measures 1.1 cm. Findings are incompletely assessed on this exam. Per Fleischner  Society Guidelines, recommend prompt non-contrast Chest CT for further evaluation. Electronically Signed   By: Jeannine Boga M.D.   On: 11/01/2022 00:07   MR BRAIN WO CONTRAST  Result Date: 10/31/2022 CLINICAL DATA:  Expressive aphasia EXAM: MRI HEAD WITHOUT CONTRAST TECHNIQUE: Multiplanar, multiecho pulse sequences of the brain and surrounding structures were obtained without intravenous contrast. COMPARISON:  Brain MRI 10/18/2022 FINDINGS: Brain: There are two adjacent punctate infarcts in the right frontal lobe anteromedially (5-18). The previously seen punctate infarct in the left temporoparietal region is no longer apparent. There is no acute intracranial hemorrhage or extra-axial fluid collection. Parenchymal volume is normal for age. The ventricles are normal in size. Patchy foci of FLAIR signal abnormality in the supratentorial white matter likely reflect sequela of chronic small-vessel ischemic change. The pituitary and suprasellar region are normal. There is no mass lesion. There is no mass effect or midline shift. Vascular: Normal flow voids. Skull and upper cervical spine: Normal marrow signal. Sinuses/Orbits: The paranasal sinuses are clear. Bilateral lens implants are in place. The globes and orbits are otherwise unremarkable. Other: None. IMPRESSION: Two adjacent punctate infarcts in the right frontal lobe. Electronically Signed   By: Valetta Mole M.D.   On: 10/31/2022 15:38  Scheduled Meds:  aspirin EC  81 mg Oral Daily   clopidogrel  75 mg Oral Daily   enoxaparin (LOVENOX) injection  40 mg Subcutaneous Q24H   hydrOXYzine  25 mg Oral Once   loratadine  10 mg Oral Daily   rosuvastatin  10 mg Oral QHS   Continuous Infusions:  sodium chloride Stopped (10/31/22 2138)    LOS: 1 day   Raiford Noble, DO [facility] Available via Epic secure chat 7am-7pm After these hours, please refer to coverage provider listed on [email or web address] 11/02/2022, 8:23 AM

## 2022-11-02 NOTE — Progress Notes (Signed)
VASCULAR LAB    Bilateral lower extremity venous duplex has been performed.  See CV proc for preliminary results.   Kaydee Magel, RVT 11/02/2022, 4:56 PM

## 2022-11-02 NOTE — Procedures (Signed)
Patient Name: Tina Murray  MRN: [id number]  Epilepsy Attending: Lora Havens  Referring Physician/Provider: August Albino, NP  Date: 11/02/2022 Duration: 25.16 mins  Patient history: 72yo F with sudden onset aphasia. EEG to evaluate for seizure  Level of alertness: Awake  AEDs during EEG study: None  Technical aspects: This EEG study was done with scalp electrodes positioned according to the 10-20 International system of electrode placement. Electrical activity was reviewed with band pass filter of 1-70Hz , sensitivity of 7 uV/mm, display speed of 42mm/sec with a 60Hz  notched filter applied as appropriate. EEG data were recorded continuously and digitally stored.  Video monitoring was available and reviewed as appropriate.  Description: The posterior dominant rhythm consists of 10 Hz activity of moderate voltage (25-35 uV) seen predominantly in posterior head regions, symmetric and reactive to eye opening and eye closing. Hyperventilation and photic stimulation were not performed.     IMPRESSION: This study is within normal limits. No seizures or epileptiform discharges were seen throughout the recording.  A normal interictal EEG does not exclude the diagnosis of epilepsy.  Carston Riedl Barbra Sarks

## 2022-11-02 NOTE — Evaluation (Signed)
Occupational Therapy Evaluation Patient Details Name: Tina Murray MRN: [id number] DOB: [birth year] Today's Date: 11/02/2022   History of Present Illness Pt is a 72 y.o. female who presented to [facility] 10/31/22 with an episode of expressive aphasia, transferred to [facility] 3/22. MRI revealed two adjacent punctate infarcts in the right frontal lobe. Of note, pt had a prior stroke in the left temporal/parietal area earlier this month. PMH: DDD, GAD, HLD, lumbar radiculopathy, migraine, OA, osteoporosis, paresthesia of foot, R side sciatica, scoliosis   Clinical Impression   Prior to this admission, patient living in [place] with her husband, and fully independent with regard to ADLs and functional mobility and still driving. Currently, patient appears to be back at her baseline from an OT standpoint, and independent with ADLs, transfers, and no apparent deficits with regard to vision and cognition. Patient restless, anxious, and impulsive throughout evaluation, with patient endorsing she is an intense and anxious person at baseline. Patient also upset at the potential that she should not drive until cleared by neurology. Patient also with a number of questions of having to potentially take medications or blood thinnners, with OT stating that medication questions were questions for her RN and medical team. OT will sign off at this time, as patient has no further acute OT needs. Please re-consult if further acute needs arise.      Recommendations for follow up therapy are one component of a multi-disciplinary discharge planning process, led by the attending physician.  Recommendations may be updated based on patient status, additional functional criteria and insurance authorization.   Follow Up Recommendations  No OT follow up     Assistance Recommended at Discharge None  Patient can return home with the following Assist for transportation    Functional Status Assessment  Patient has had a recent  decline in their functional status and demonstrates the ability to make significant improvements in function in a reasonable and predictable amount of time.  Equipment Recommendations  None recommended by OT    Recommendations for Other Services       Precautions / Restrictions Precautions Precautions: None Restrictions Weight Bearing Restrictions: No      Mobility Bed Mobility Overal bed mobility: Independent                  Transfers Overall transfer level: Independent Equipment used: None               General transfer comment: Patient up and walking around the room upon OT entry      Balance Overall balance assessment: Independent                                         ADL either performed or assessed with clinical judgement   ADL Overall ADL's : At baseline                                       General ADL Comments: Patient is at her baseline per demonstration and report in OT evaluation     Vision Baseline Vision/History: 0 No visual deficits (Cataract surgery) Ability to See in Adequate Light: 0 Adequate Patient Visual Report: No change from baseline Vision Assessment?: Yes Eye Alignment: Within Functional Limits Ocular Range of Motion: Within Functional Limits Alignment/Gaze Preference: Within Defined Limits Tracking/Visual Pursuits: Able  to track stimulus in all quads without difficulty Convergence: Within functional limits Visual Fields: No apparent deficits     Perception     Praxis      Pertinent Vitals/Pain Pain Assessment Pain Assessment: No/denies pain     Hand Dominance     Extremity/Trunk Assessment Upper Extremity Assessment Upper Extremity Assessment: Overall WFL for tasks assessed   Lower Extremity Assessment Lower Extremity Assessment: Overall WFL for tasks assessed   Cervical / Trunk Assessment Cervical / Trunk Assessment: Normal   Communication Communication Communication:  No difficulties   Cognition Arousal/Alertness: Awake/alert Behavior During Therapy: Restless, Anxious, Impulsive Overall Cognitive Status: Within Functional Limits for tasks assessed                                 General Comments: Patient admits she is a very anxious and intense person at baseline.     General Comments  Patient very anxious, upset at the potential that she should not drive until cleared by neurology. Upset about potentially having to take medications or blood thinnners, with OT stating that medication questions were questions for her RN and medical team.    Exercises     Shoulder Instructions      Home Living Family/patient expects to be discharged to:: Private residence Living Arrangements: Spouse/significant other Available Help at Discharge: Available 24 hours/day Type of Home: Independent living facility Home Access: [place]: One level     Bathroom Shower/Tub: Occupational psychologist: Handicapped height Bathroom Accessibility: Yes How Accessible: Accessible via wheelchair [place]: Grab bars - tub/shower;Grab bars - toilet;Shower seat;Cane - single point          Prior Functioning/Environment Prior Level of Function : Driving;Independent/Modified Independent             Mobility Comments: independent ADLs Comments: independent        OT Problem List:        OT Treatment/Interventions:      OT Goals(Current goals can be found in the care plan section) Acute Rehab OT Goals Patient Stated Goal: to get back to my life OT Goal Formulation: With patient Time For Goal Achievement: 11/16/22 Potential to Achieve Goals: Good  OT Frequency:      Co-evaluation              AM-PAC OT "6 Clicks" Daily Activity     Outcome Measure Help from another person eating meals?: None Help from another person taking care of personal grooming?: None Help from another person toileting, which includes  using toliet, bedpan, or urinal?: None Help from another person bathing (including washing, rinsing, drying)?: None Help from another person to put on and taking off regular upper body clothing?: None Help from another person to put on and taking off regular lower body clothing?: None 6 Click Score: 24   End of Session Nurse Communication: Mobility status  Activity Tolerance: Patient tolerated treatment well Patient left: in bed;with call bell/phone within reach  OT Visit Diagnosis: Unsteadiness on feet (R26.81)                Time: [id number] OT Time Calculation (min): 16 min Charges:  OT General Charges $OT Visit: 1 Visit OT Evaluation $OT Eval Moderate Complexity: 1 Mod  Corinne Ports E. Metta Koranda, OTR/L Acute Rehabilitation Services [phone]   Ascencion Dike 11/02/2022, 10:15 AM

## 2022-11-03 ENCOUNTER — Other Ambulatory Visit: Payer: Self-pay | Admitting: Physician Assistant

## 2022-11-03 ENCOUNTER — Telehealth: Payer: Self-pay | Admitting: Nurse Practitioner

## 2022-11-03 DIAGNOSIS — I639 Cerebral infarction, unspecified: Secondary | ICD-10-CM

## 2022-11-03 DIAGNOSIS — I6300 Cerebral infarction due to thrombosis of unspecified precerebral artery: Secondary | ICD-10-CM | POA: Diagnosis not present

## 2022-11-03 DIAGNOSIS — F419 Anxiety disorder, unspecified: Secondary | ICD-10-CM | POA: Diagnosis not present

## 2022-11-03 DIAGNOSIS — E7800 Pure hypercholesterolemia, unspecified: Secondary | ICD-10-CM | POA: Diagnosis not present

## 2022-11-03 MED ORDER — CLOPIDOGREL BISULFATE 75 MG PO TABS: 75.0000 mg | ORAL_TABLET | Freq: Every day | ORAL | 0 refills | Status: AC

## 2022-11-03 NOTE — Plan of Care (Signed)

## 2022-11-03 NOTE — Progress Notes (Signed)
Dr was at bedside and reviewed all discharge instructions with pt then this nurse did the same thing.  All questions answered.  No complaints.  Waiting for husband to come so she may be taken to the front entrance to his vehicle.

## 2022-11-03 NOTE — Progress Notes (Signed)
Appears the first monitor was not completed, unclear why. She presented back with recurrent CVA. 30 day event monitor was again recommended. Will send order to [facility].

## 2022-11-03 NOTE — Progress Notes (Signed)
STROKE TEAM PROGRESS NOTE   INTERVAL HISTORY Patient is doing fine.  She is ready to go home.  Telemetry monitoring yesterday showed brief run of what was initially felt to be A-fib but turned out to be SVT.  She does have prior history of SVT years ago.  She has been scheduled to see Dr. Gwyndolyn Kaufman cardiologist as an outpatient.  She is agreed to a 30-day heart monitor. Vitals:   11/02/22 2115 11/02/22 2341 11/03/22 0330 11/03/22 0758  BP: 102/64 98/66 95/63  101/69  Pulse: 72 65 66 72  Resp:  20 20 17   Temp: (!) 97.4 F (36.3 C) 97.6 F (36.4 C) 97.6 F (36.4 C) (!) 97.5 F (36.4 C)  TempSrc: Axillary Oral Oral Oral  SpO2: 97% 96% 99% 100%  Height:       CBC:  Recent Labs  Lab 11/01/22 1513 11/02/22 0159  WBC 7.2 5.8  NEUTROABS 4.6 2.4  HGB 14.1 12.2  HCT 43.1 37.2  MCV 93.9 93.2  PLT 308 [id number]   Basic Metabolic Panel:  Recent Labs  Lab 11/01/22 1513 11/02/22 0159  NA 138 140  K 3.5 3.9  CL 105 110  CO2 23 23  GLUCOSE 107* 97  BUN 14 13  CREATININE 0.99 0.79  CALCIUM 9.3 8.4*  MG 2.2 2.2  PHOS 3.8 4.1   Lipid Panel:  Recent Labs  Lab 11/01/22 0626  CHOL 146  TRIG 81  HDL 56  CHOLHDL 2.6  VLDL 16  LDLCALC 74   HgbA1c:  Recent Labs  Lab 11/01/22 0626  HGBA1C 5.8*   Urine Drug Screen: No results for input(s): "LABOPIA", "COCAINSCRNUR", "LABBENZ", "AMPHETMU", "THCU", "LABBARB" in the last 168 hours.  Alcohol Level  Recent Labs  Lab 10/31/22 1314  ETH <10    IMAGING past 24 hours CT CHEST WO CONTRAST  Result Date: 11/02/2022 CLINICAL DATA:  Follow-up lung nodule greater than 8 mm. EXAM: CT CHEST WITHOUT CONTRAST TECHNIQUE: Multidetector CT imaging of the chest was performed following the standard protocol without IV contrast. RADIATION DOSE REDUCTION: This exam was performed according to the departmental dose-optimization program which includes automated exposure control, adjustment of the mA and/or kV according to patient size and/or use of  iterative reconstruction technique. COMPARISON:  No prior comparison studies are available. FINDINGS: Cardiovascular: Normal heart size. No pericardial effusions. Normal caliber thoracic aorta. Scattered calcification in the aorta and coronary arteries. Mediastinum/Nodes: Thyroid gland is unremarkable. Esophagus is decompressed. No significant lymphadenopathy. Small Bochdalek's hernias containing fat. Lungs/Pleura: Nodular scarring in the lung apices bilaterally. Additional parenchymal and subpleural nodules in the right upper lung, with largest measuring 8 mm. No consolidation or airspace disease. Airways are patent. No pleural effusions. No pneumothorax. Upper Abdomen: Circumscribed low-attenuation lesions in the liver, largest measuring up to 10 mm diameter. Characterization is limited on noncontrast imaging but these likely represent small cysts or hemangiomas. Increased density in the gallbladder may represent sludge, small stones, or milk of calcium. No inflammatory changes suggested. Musculoskeletal: Degenerative changes in the spine. Degenerative changes in the shoulders. IMPRESSION: 1. Multiple pulmonary nodules in the right upper lung. Most significant: 8 mm right solid pulmonary nodule within the upper lobe. Per Fleischner Society Guidelines, recommend a non-contrast Chest CT at 3-6 months, then consider another non-contrast Chest CT at 18-24 months. If patient is low risk for malignancy, non-contrast Chest CT at 18-24 months is optional. Comparison with any old outside studies would be useful if made available. These guidelines do not apply to  immunocompromised patients and patients with cancer. Follow up in patients with significant comorbidities as clinically warranted. For lung cancer screening, adhere to Lung-RADS guidelines. Reference: Radiology. 2017; 284(1):228-43. 2. Bilateral apical pleural thickening, likely postinflammatory. 3. No evidence of active pulmonary disease. 4. Low-attenuation liver  lesions measuring up to 10 mm diameter are likely small cysts or hemangiomas. 5. Increased density in the gallbladder may represent sludge, small stones, or milk of calcium. No inflammatory changes. Electronically Signed   By: Lucienne Capers M.D.   On: 11/02/2022 21:48   VAS [place] TRANSCRANIAL DOPPLER W BUBBLES  Result Date: 11/02/2022  Transcranial Doppler with Bubble Patient Name:  Tina Murray  Date of Exam:   11/02/2022 Medical Rec #: [id number]      Accession #:    [id number] Date of Birth: August 23, 1950     Patient Gender: F Patient Age:   72 years Exam Location:  [facility] Procedure:      VAS [place] TRANSCRANIAL DOPPLER W BUBBLES Referring Phys: Onis Markoff --------------------------------------------------------------------------------  Indications: Stroke. History: Embolic infarcts. History of migraines. Limitations: Patient talking and movement Comparison Study: No prior study on file Performing Technologist: Sharion Dove RVS  Examination Guidelines: A complete evaluation includes B-mode imaging, spectral Doppler, color Doppler, and power Doppler as needed of all accessible portions of each vessel. Bilateral testing is considered an integral part of a complete examination. Limited examinations for reoccurring indications may be performed as noted.  Summary: No HITS at rest or during Valsalva. Negative transcranial Doppler Bubble study with no evidence of right to left intracardiac communication.  *See table(s) above for TCD measurements and observations.    Preliminary    VAS [place] LOWER EXTREMITY VENOUS (DVT)  Result Date: 11/02/2022  Lower Venous DVT Study Patient Name:  Tina Murray  Date of Exam:   11/02/2022 Medical Rec #: [id number]      Accession #:    [id number] Date of Birth: [birth year]     Patient Gender: F Patient Age:   76 years Exam Location:  [facility] Procedure:      VAS [place] LOWER EXTREMITY VENOUS (DVT) Referring Phys: Janine Ores  --------------------------------------------------------------------------------  Indications: Stroke.  Comparison Study: No prior study on file Performing Technologist: Sharion Dove RVS  Examination Guidelines: A complete evaluation includes B-mode imaging, spectral Doppler, color Doppler, and power Doppler as needed of all accessible portions of each vessel. Bilateral testing is considered an integral part of a complete examination. Limited examinations for reoccurring indications may be performed as noted. The reflux portion of the exam is performed with the patient in reverse Trendelenburg.  +---------+---------------+---------+-----------+----------+--------------+ RIGHT    CompressibilityPhasicitySpontaneityPropertiesThrombus Aging +---------+---------------+---------+-----------+----------+--------------+ CFV      Full           Yes      Yes                                 +---------+---------------+---------+-----------+----------+--------------+ SFJ      Full                                                        +---------+---------------+---------+-----------+----------+--------------+ FV Prox  Full                                                        +---------+---------------+---------+-----------+----------+--------------+  FV Mid   Full           Yes      Yes                                 +---------+---------------+---------+-----------+----------+--------------+ FV DistalFull                                                        +---------+---------------+---------+-----------+----------+--------------+ PFV      Full                                                        +---------+---------------+---------+-----------+----------+--------------+ POP      Full           Yes      Yes                                 +---------+---------------+---------+-----------+----------+--------------+ PTV      Full                                                         +---------+---------------+---------+-----------+----------+--------------+ PERO     Full                                                        +---------+---------------+---------+-----------+----------+--------------+   +---------+---------------+---------+-----------+----------+--------------+ LEFT     CompressibilityPhasicitySpontaneityPropertiesThrombus Aging +---------+---------------+---------+-----------+----------+--------------+ CFV      Full           Yes      Yes                                 +---------+---------------+---------+-----------+----------+--------------+ SFJ      Full                                                        +---------+---------------+---------+-----------+----------+--------------+ FV Prox  Full                                                        +---------+---------------+---------+-----------+----------+--------------+ FV Mid   Full           Yes      Yes                                 +---------+---------------+---------+-----------+----------+--------------+  FV DistalFull                                                        +---------+---------------+---------+-----------+----------+--------------+ PFV      Full                                                        +---------+---------------+---------+-----------+----------+--------------+ POP      Full           Yes      Yes                                 +---------+---------------+---------+-----------+----------+--------------+ PTV      Full                                                        +---------+---------------+---------+-----------+----------+--------------+ PERO     Full                                                        +---------+---------------+---------+-----------+----------+--------------+     Summary: BILATERAL: - No evidence of deep vein thrombosis seen in the lower extremities,  bilaterally. -No evidence of popliteal cyst, bilaterally.   *See table(s) above for measurements and observations.    Preliminary     PHYSICAL EXAM GENERAL: Awake, alert in NAD anxious middle-aged Caucasian lady HEENT: - Normocephalic and atraumatic, dry mm LUNGS - Clear to auscultation bilaterally with no wheezes CV - S1S2 RRR, no m/r/g, equal pulses bilaterally. ABDOMEN - Soft, nontender, nondistended with normoactive BS Ext: warm, well perfused, intact peripheral pulses, no edema   NEURO:  Mental Status: AA&Ox3  Language: speech is clear.  Naming, repetition, fluency, and comprehension intact. Cranial Nerves: PERRL mm/brisk. EOMI, visual fields full, no facial asymmetry, facial sensation intact, hearing intact, tongue/uvula/soft palate midline, normal sternocleidomastoid and trapezius muscle strength. No evidence of tongue atrophy or fasciculations Motor: Moves all extremities antigravity  Tone: is normal and bulk is normal Sensation- Intact to light touch bilaterally Coordination: FTN intact bilaterally, no ataxia in BLE. Gait- deferred  ASSESSMENT/PLAN Ms. Samyukta Orren is a 72 y.o. female with history of past medical history of chronic migraine, osteoporosis, HLD, and recent CVA on ASA 81mg  (seen by Dr. Krista Blue at [facility]) who presented to [facility] after a sudden onset of aphasia that lasted less than 5 minutes.  She does not currently have any symptoms.   Stroke:  Two adjacent punctate infarcts in the right frontal lobe.  With left temporal MCA branch embolic infarct 3 weeks ago Etiology:   embolic  Code Stroke CT head Previously identified punctate right frontal infarcts not visible by CT. No other acute intracranial abnormality. Moderate chronic microvascular ischemic disease.  CTA head & neck Negative CTA for large vessel occlusion  or other emergent finding.Mild for age atheromatous change about the major arterial vasculature of the head and neck. No hemodynamically significant or correctable  stenosis.  MRI  Two adjacent punctate infarcts in the right frontal lobe TCD Bubble Study- pending 2D Echo EF 55 to 60%, bilateral atria normal in size  [place] LLE pending  LDL 74 HgbA1c 5.8 VTE prophylaxis - Lovenox     Diet   Diet regular Room service appropriate? Yes; Fluid consistency: Thin   aspirin 81 mg daily prior to admission, now on aspirin 81 mg daily and clopidogrel 75 mg daily. \ Therapy recommendations:  pending Disposition:  pending  Hypertension Home meds:  none Stable Permissive hypertension (OK if < 220/120) but gradually normalize in 5-7 days Long-term BP goal normotensive  Hyperlipidemia Home meds:  crestor 10mg , resumed in hospital LDL 74, goal < 70 Continue statin at discharge  Other Stroke Risk Factors Advanced Age >/= 47  Hx stroke/TIA  Other Active Problems GERD  Hospital day # 2  Patient has had 2 transient episodes of gibberish speech and expressive aphasia 3 weeks apart and previous MRI had shown punctate left temporal infarct and the current one shows 2 tiny right medial frontal punctate embolic infarcts as well.  Recommend 30-day cardiac monitoring for paroxysmal A-fib at discharge.  Recommend aspirin and Plavix for 3 weeks followed by aspirin alone and aggressive risk factor modification.  Long discussion with the patient and  answered questions.  Discussed with Dr. Zollie Scale.  Greater than 50% time during this 63minute visit was spent on counseling and coordination of care about her embolic stroke and discussion about evaluation and treatment and answering questions.  Follow-up with cardiology as an outpatient for SVT.  Follow-up as an outpatient stroke clinic in 2 months  Antony Contras, [place] Pager: [phone] 11/03/2022 3:46 PM   To contact Stroke Continuity provider, please refer to [email or web address] After hours, contact General Neurology

## 2022-11-04 ENCOUNTER — Ambulatory Visit: Payer: Medicare Other | Attending: Physician Assistant

## 2022-11-04 ENCOUNTER — Other Ambulatory Visit: Payer: Self-pay | Admitting: Physician Assistant

## 2022-11-04 ENCOUNTER — Telehealth: Payer: Self-pay

## 2022-11-04 DIAGNOSIS — I4891 Unspecified atrial fibrillation: Secondary | ICD-10-CM

## 2022-11-04 DIAGNOSIS — I639 Cerebral infarction, unspecified: Secondary | ICD-10-CM

## 2022-11-04 NOTE — Telephone Encounter (Signed)
We received a new referral for this patient for a hospital follow up for a new CVA. She has seen Dr Krista Blue in the past for a prior CVA, LS 10/22/2022 for sudden onset speech difficulty,  TIA. She was admitted 3/21 for expressive aphasia and MRI showed two adjacent punctate infarcts in the right frontal lobe.   She is requesting a transfer of care to from Dr Krista Blue to Dr Leonie Man per discharge instructions. I did let her know Dr Krista Blue has seen her for something similar in the past but she advised that she was told by Dr Leonie Man to follow up with him.   Please advise.

## 2022-11-04 NOTE — Progress Notes (Unsigned)
[id number] from office inventory applied to patient. Patient also given Hydrocolloid electrodes.

## 2022-11-04 NOTE — Telephone Encounter (Signed)
Left voicemail for patient to call back to schedule consult. 

## 2022-11-04 NOTE — Discharge Summary (Signed)
Physician Discharge Summary   Patient: Tina Murray MRN: [id number] DOB: 1951-04-19  Admit date:     10/31/2022  Discharge date: 11/03/2022  Discharge Physician: Raiford Noble, DO   PCP: Lurline Del, DO   Recommendations at discharge:   Follow-up with PCP within 1 to 2 weeks and repeat CBC, CMP, mag, Phos within 1 week Follow-up with cardiology and has an appointment already scheduled with Dr. Johney Frame and continue with cardiac monitoring in outpatient setting with 30-day monitor Follow-up with pulmonary in outpatient setting for lung nodules noted and repeat CT scan in outpatient setting Follow-up with Neurology Dr. Leonie Man in 6 weeks in outpatient setting continue dual antiplatelet therapy for 3 weeks and then just aspirin alone Follow-up in outpatient setting with PCP for further evaluation of the small liver lesions noted to be likely hemangiomas  Discharge Diagnoses: Principal Problem:   Cerebrovascular accident (CVA) [facility]) Active Problems:   Anxiety   Hypercholesterolemia   CVA (cerebral vascular accident) ([place])   Acute CVA (cerebrovascular accident) ([place])  Resolved Problems:   * No resolved hospital problems. [facility] Course: The patient is a 72 year old Caucasian female past medical history significant for chronic migraine, osteoporosis, hyperlipidemia and recent CVA on 10/12/2022.  Patient reported having migraine aura after the reported stroke of 10/12/2022.  Patient was discharged home on aspirin.  According to the patient, she had word finding problems, as well as, "disorderly speech".  Patient may have been discharged with Holter monitor but patient may not have been compliant.  Patient presented with an under brief episode of strokelike activity.  Patient also reported migraine symptoms currently.  CTA head and neck is nonrevealing.  Patient will be transferred to [facility] for further assessment and management.    **She was seen by the neurology team who  recommends a loop recorder at discharge with the patient does not want as well as aspirin 81 mg and Plavix send 5 mg daily for 3 weeks and then just aspirin alone but patient does not want take the Plavix.  Neurology recommending further workup with a TCD bubble study which was done and negative however there is concern for possible atrial fibrillation so I reached out to the Cards fellow to evaluate to see if she has true atrial fibrillation and if she does a formal consult will be placed.[name]  EEG was done which showed that the study was within normal limits with no seizures or epileptiform discharges seen throughout the reading.  PT OT and SLP recommending no follow-up currently.   Patient was found to have an abnormality on her CT scan of the head and neck until radiologist had recommended a CT scan of the chest to evaluate without contrast and this is being ordered.  Assessment and Plan: Acute Right Frontal Lobe adjacent punctate infarcts  -Hx of recent CVA with punctate acute left temporoparietal infarct on 10/12/2022.  Has been on aspirin daily. -MRI brain revealed 2 adjacent punctate infarcts in the right frontal lobe. -CTA head and neck done and showed "CT HEAD IMPRESSION:   1. Previously identified punctate right frontal infarcts not visible by CT. No other acute intracranial abnormality. 2. Moderate chronic microvascular ischemic disease.   CTA HEAD AND NECK IMPRESSION:   1. Negative CTA for large vessel occlusion or other emergent finding. 2. Mild for age atheromatous change about the major arterial vasculature of the head and neck. No hemodynamically significant or correctable stenosis. 3. Irregular biapical pleuroparenchymal thickening with a few scattered superimposed nodular  densities at the right lung apex, largest of which measures 1.1 cm. Findings are incompletely assessed on this exam. Per Fleischner Society Guidelines, recommend prompt non-contrast Chest CT for further  evaluation." -Echo on 10/15/22 with EF of 55-60% and normal diastolic parameters. No significant valvular abnormalities. -Patient will be transferred to [facility] for further workup and management by the neurology team. -Neurology recommending a loop recorder however patient is declining this and now they feel that she will need prolonged cardiac monitoring if TCD bubble study is negative to evaluate for proximal atrial fibrillation at discharge and will need a 30-day monitor at least -TCD bubble study was negative however neurology felt that she may have had a short run of A-fib so asked to see if EP could evaluate the rhythm strip or cardiology evaluate.  Have notified the cards fellow Dr. Nira Retort and will evaluate to see if formal consult is needed or not: If A-fib is confirmed she will need anticoagulation; Dr. Nira Retort does not feel that this is atrial fibrillation and agrees with the 30-day monitor and 30-day monitoring has been arranged after I spoke with the cardiac PA Fabian Sharp -Neurology is recommending Aspirin and Plavix daily for 3 weeks and then just followed by aspirin alone as well as aggressive risk factor modification and patient is now agreeable -A1c was 5.8 -Lower Extremity venous duplex was negative for DVT -EEG done and showed that the study was within normal limits with no seizures or epileptiform discharges seen throughout the recording  -Lipid profile revealed LDL of 74, HDL of 56 and total cholesterol 146.   -PT/OT/SLP recommending no follow-up -Frequent neuro checks and keep on telemetry for now -Allow for permissive hypertension with blood pressure treatment as needed only if systolic goes above [id number] -Follow-up in the outpatient setting with neurology in 6 to 8 weeks   Abnormal Right Lung Density/Thickening -CTA of the head and neck showed "Irregular biapical pleuroparenchymal thickening with a few scattered superimposed nodular densities at the right lung apex, largest  of which measures 1.1 cm. Findings are incompletely assessed on this exam."  -Will obtain a CT scan of the chest without contrast for further evaluation -CT scan of the chest done and showed "Multiple pulmonary nodules in the right upper lung. Most significant: 8 mm right solid pulmonary nodule within the upper recommend a non-contrast Chest CT at 3-6 months, then consider another non-contrast Chest CT at 18-24 months. If patient is low risk for malignancy, non-contrast Chest CT at 18-24 months is optional. Comparison with any old outside studies would be useful if made available.. Bilateral apical pleural thickening, likely postinflammatory. No evidence of active pulmonary disease.  Low-attenuation liver lesions measuring up to 10 mm diameter are likely small cysts or hemangiomas.  Increased density in the gallbladder may represent sludge, small stones, or milk of calcium. No inflammatory changes."   Hypercholesterolemia -Lipid Panel done and showed a total cholesterol/HDL ratio 2.6, cholesterol level 146, HDL 56, LDL 74, triglycerides of 81, VLDL 16 -Continue Rosuvastatin 10 mg- pt reports higher dose makes her "ill"   Anxiety -Pt very anxious regarding her diagnosis as she otherwise is healthy and avoids most medications   Prediabetes  -HbA1c is now 5.8 -Continue to Monitor CBGs per protocol and if necessary will place on Sensitive Noviolog SSI AC Recent Labs  Lab 10/31/22 1257  Lynn    Consultants: Neurology Procedures performed: As delineated as above  Disposition: Home Diet recommendation:  Discharge Diet Orders (From admission, onward)  Start     Ordered   11/03/22 0000  Diet - low sodium heart healthy        11/03/22 1036           Cardiac and Carb modified diet DISCHARGE MEDICATION: Allergies as of 11/03/2022   No Known Allergies      Medication List     STOP taking these medications    celecoxib 100 MG capsule Commonly known as: CeleBREX       TAKE  these medications    aspirin EC 81 MG tablet Take 81 mg by mouth daily. Swallow whole.   clonazePAM 0.5 MG tablet Commonly known as: KLONOPIN Take 0.5 mg by mouth as needed for anxiety.   clopidogrel 75 MG tablet Commonly known as: PLAVIX Take 1 tablet (75 mg total) by mouth daily for 21 days.   diclofenac sodium 1 % Gel Commonly known as: VOLTAREN Apply 2 g topically as needed (pain).   DRY EYE RELIEF DROPS OP Apply 1 drop to eye every 4 (four) hours.   famotidine 10 MG tablet Commonly known as: PEPCID Take 10 mg by mouth as needed for heartburn or indigestion.   hydrocortisone 2.5 % cream Apply 1 Application topically as needed.   Rosuvastatin Calcium 10 MG Cpsp Take 10 mg by mouth every evening.        Follow-up Information     Lurline Del, DO Follow up.   Specialty: Family Medicine Why: Call for follow up within 1 week Contact information: [place] [address] [phone]         Garvin Fila, MD Follow up.   Specialties: Neurology, Radiology Why: Follow up in 6-8 weeks for Stroke Follow up Contact information: [address] [place] [address] [phone]         Freada Bergeron, MD Follow up.   Specialties: Cardiology, Radiology Why: Follow up as scheduled Contact information: [address] [place] [address] [phone]         [facility] Follow up.   Specialty: Pulmonology Why: Follow up for Lung Nodules Contact information: [place] [id number] [phone]               Discharge Exam: There were no vitals filed for this visit. Vitals:   11/03/22 0330 11/03/22 0758  BP: 95/63 101/69  Pulse: 66 72  Resp: 20 17  Temp: 97.6 F (36.4 C) (!) 97.5 F (36.4 C)  SpO2: 99% 100%   Examination: Physical Exam:  Constitutional: Thin Caucasian female in no acute distress but  appears little anxious Respiratory: Diminished to auscultation bilaterally, no wheezing, rales, rhonchi or crackles. Normal respiratory effort and patient is not tachypenic. No accessory muscle use.  Unlabored breathing Cardiovascular: RRR, no murmurs / rubs / gallops. S1 and S2 auscultated. No extremity edema.  Abdomen: Soft, non-tender, non-distended. Bowel sounds positive.  GU: Deferred. Musculoskeletal: No clubbing / cyanosis of digits/nails. No joint deformity upper and lower extremities. Skin: No rashes, lesions, ulcers limited skin evaluation. No induration; Warm and dry.  Neurologic: CN 2-12 grossly intact with no focal deficits. Romberg sign and cerebellar reflexes not assessed.  Psychiatric: Normal judgment and insight. Alert and oriented x 3.  She has an anxious mood  Condition at discharge: stable  The results of significant diagnostics from this hospitalization (including imaging, microbiology, ancillary and laboratory) are listed below for reference.  Imaging Studies: VAS [place] LOWER EXTREMITY VENOUS (DVT)  Result Date: 11/04/2022  Lower Venous DVT Study Patient Name:  RICKEY POSTLE  Date of Exam:   11/02/2022 Medical Rec #: [id number]      Accession #:    [id number] Date of Birth: [birth year]     Patient Gender: F Patient Age:   36 years Exam Location:  [facility] Procedure:      VAS [place] LOWER EXTREMITY VENOUS (DVT) Referring Phys: Janine Ores --------------------------------------------------------------------------------  Indications: Stroke.  Comparison Study: No prior study on file Performing Technologist: Sharion Dove RVS  Examination Guidelines: A complete evaluation includes B-mode imaging, spectral Doppler, color Doppler, and power Doppler as needed of all accessible portions of each vessel. Bilateral testing is considered an integral part of a complete examination. Limited examinations for reoccurring indications may be performed as noted. The reflux portion of the exam  is performed with the patient in reverse Trendelenburg.  +---------+---------------+---------+-----------+----------+--------------+ RIGHT    CompressibilityPhasicitySpontaneityPropertiesThrombus Aging +---------+---------------+---------+-----------+----------+--------------+ CFV      Full           Yes      Yes                                 +---------+---------------+---------+-----------+----------+--------------+ SFJ      Full                                                        +---------+---------------+---------+-----------+----------+--------------+ FV Prox  Full                                                        +---------+---------------+---------+-----------+----------+--------------+ FV Mid   Full           Yes      Yes                                 +---------+---------------+---------+-----------+----------+--------------+ FV DistalFull                                                        +---------+---------------+---------+-----------+----------+--------------+ PFV      Full                                                        +---------+---------------+---------+-----------+----------+--------------+ POP      Full           Yes      Yes                                 +---------+---------------+---------+-----------+----------+--------------+ PTV      Full                                                        +---------+---------------+---------+-----------+----------+--------------+  PERO     Full                                                        +---------+---------------+---------+-----------+----------+--------------+   +---------+---------------+---------+-----------+----------+--------------+ LEFT     CompressibilityPhasicitySpontaneityPropertiesThrombus Aging +---------+---------------+---------+-----------+----------+--------------+ CFV      Full           Yes      Yes                                  +---------+---------------+---------+-----------+----------+--------------+ SFJ      Full                                                        +---------+---------------+---------+-----------+----------+--------------+ FV Prox  Full                                                        +---------+---------------+---------+-----------+----------+--------------+ FV Mid   Full           Yes      Yes                                 +---------+---------------+---------+-----------+----------+--------------+ FV DistalFull                                                        +---------+---------------+---------+-----------+----------+--------------+ PFV      Full                                                        +---------+---------------+---------+-----------+----------+--------------+ POP      Full           Yes      Yes                                 +---------+---------------+---------+-----------+----------+--------------+ PTV      Full                                                        +---------+---------------+---------+-----------+----------+--------------+ PERO     Full                                                        +---------+---------------+---------+-----------+----------+--------------+  Summary: BILATERAL: - No evidence of deep vein thrombosis seen in the lower extremities, bilaterally. -No evidence of popliteal cyst, bilaterally.   *See table(s) above for measurements and observations. Electronically signed by Orlie Pollen on 11/04/2022 at 3:42:09 AM.    Final    CT CHEST WO CONTRAST  Result Date: 11/02/2022 CLINICAL DATA:  Follow-up lung nodule greater than 8 mm. EXAM: CT CHEST WITHOUT CONTRAST TECHNIQUE: Multidetector CT imaging of the chest was performed following the standard protocol without IV contrast. RADIATION DOSE REDUCTION: This exam was performed according to the departmental dose-optimization program  which includes automated exposure control, adjustment of the mA and/or kV according to patient size and/or use of iterative reconstruction technique. COMPARISON:  No prior comparison studies are available. FINDINGS: Cardiovascular: Normal heart size. No pericardial effusions. Normal caliber thoracic aorta. Scattered calcification in the aorta and coronary arteries. Mediastinum/Nodes: Thyroid gland is unremarkable. Esophagus is decompressed. No significant lymphadenopathy. Small Bochdalek's hernias containing fat. Lungs/Pleura: Nodular scarring in the lung apices bilaterally. Additional parenchymal and subpleural nodules in the right upper lung, with largest measuring 8 mm. No consolidation or airspace disease. Airways are patent. No pleural effusions. No pneumothorax. Upper Abdomen: Circumscribed low-attenuation lesions in the liver, largest measuring up to 10 mm diameter. Characterization is limited on noncontrast imaging but these likely represent small cysts or hemangiomas. Increased density in the gallbladder may represent sludge, small stones, or milk of calcium. No inflammatory changes suggested. Musculoskeletal: Degenerative changes in the spine. Degenerative changes in the shoulders. IMPRESSION: 1. Multiple pulmonary nodules in the right upper lung. Most significant: 8 mm right solid pulmonary nodule within the upper lobe. Per Fleischner Society Guidelines, recommend a non-contrast Chest CT at 3-6 months, then consider another non-contrast Chest CT at 18-24 months. If patient is low risk for malignancy, non-contrast Chest CT at 18-24 months is optional. Comparison with any old outside studies would be useful if made available. These guidelines do not apply to immunocompromised patients and patients with cancer. Follow up in patients with significant comorbidities as clinically warranted. For lung cancer screening, adhere to Lung-RADS guidelines. Reference: Radiology. 2017; 284(1):228-43. 2. Bilateral apical  pleural thickening, likely postinflammatory. 3. No evidence of active pulmonary disease. 4. Low-attenuation liver lesions measuring up to 10 mm diameter are likely small cysts or hemangiomas. 5. Increased density in the gallbladder may represent sludge, small stones, or milk of calcium. No inflammatory changes. Electronically Signed   By: Lucienne Capers M.D.   On: 11/02/2022 21:48   VAS [place] TRANSCRANIAL DOPPLER W BUBBLES  Result Date: 11/02/2022  Transcranial Doppler with Bubble Patient Name:  NORALYN ANDERBERG  Date of Exam:   11/02/2022 Medical Rec #: [id number]      Accession #:    [id number] Date of Birth: 1951/02/26     Patient Gender: F Patient Age:   72 years Exam Location:  [facility] Procedure:      VAS [place] TRANSCRANIAL DOPPLER W BUBBLES Referring Phys: PRAMOD SETHI --------------------------------------------------------------------------------  Indications: Stroke. History: Embolic infarcts. History of migraines. Limitations: Patient talking and movement Comparison Study: No prior study on file Performing Technologist: Sharion Dove RVS  Examination Guidelines: A complete evaluation includes B-mode imaging, spectral Doppler, color Doppler, and power Doppler as needed of all accessible portions of each vessel. Bilateral testing is considered an integral part of a complete examination. Limited examinations for reoccurring indications may be performed as noted.  Summary: No HITS at rest or during Valsalva. Negative transcranial Doppler Bubble study with no evidence of  right to left intracardiac communication.  *See table(s) above for TCD measurements and observations.    Preliminary    EEG adult  Result Date: 11/02/2022 Lora Havens, MD     11/02/2022 11:46 AM Patient Name: Ruya Woodhall MRN: [id number] Epilepsy Attending: Lora Havens Referring Physician/Provider: August Albino, NP Date: 11/02/2022 Duration: 25.16 mins Patient history: 72yo F with sudden onset aphasia. EEG to evaluate  for seizure Level of alertness: Awake AEDs during EEG study: None Technical aspects: This EEG study was done with scalp electrodes positioned according to the 10-20 International system of electrode placement. Electrical activity was reviewed with band pass filter of 1-70Hz , sensitivity of 7 uV/mm, display speed of 81mm/sec with a 60Hz  notched filter applied as appropriate. EEG data were recorded continuously and digitally stored.  Video monitoring was available and reviewed as appropriate. Description: The posterior dominant rhythm consists of 10 Hz activity of moderate voltage (25-35 uV) seen predominantly in posterior head regions, symmetric and reactive to eye opening and eye closing. Hyperventilation and photic stimulation were not performed.   IMPRESSION: This study is within normal limits. No seizures or epileptiform discharges were seen throughout the recording. A normal interictal EEG does not exclude the diagnosis of epilepsy. Lora Havens   CT ANGIO HEAD NECK W WO CM  Result Date: 11/01/2022 CLINICAL DATA:  Follow-up examination for acute stroke. EXAM: CT ANGIOGRAPHY HEAD AND NECK TECHNIQUE: Multidetector CT imaging of the head and neck was performed using the standard protocol during bolus administration of intravenous contrast. Multiplanar CT image reconstructions and MIPs were obtained to evaluate the vascular anatomy. Carotid stenosis measurements (when applicable) are obtained utilizing NASCET criteria, using the distal internal carotid diameter as the denominator. RADIATION DOSE REDUCTION: This exam was performed according to the departmental dose-optimization program which includes automated exposure control, adjustment of the mA and/or kV according to patient size and/or use of iterative reconstruction technique. CONTRAST:  42mL OMNIPAQUE IOHEXOL 350 MG/ML SOLN COMPARISON:  Prior MRI from earlier the same day. FINDINGS: CT HEAD FINDINGS Brain: Cerebral volume within normal limits. Moderate  chronic microvascular ischemic disease again noted. Previously identified punctate right frontal infarcts not visible by CT. No other acute large vessel territory infarct. No acute intracranial hemorrhage. No mass lesion or midline shift. No hydrocephalus or extra-axial fluid collection. Vascular: No abnormal hyperdense vessel. Scattered vascular calcifications noted within the carotid siphons. Skull: Scalp soft tissues and calvarium demonstrate no acute finding. Sinuses/Orbits: Globes and orbital soft tissues within normal limits. Paranasal sinuses and mastoid air cells are clear. Other: None. Review of the MIP images confirms the above findings CTA NECK FINDINGS Aortic arch: Visualized aortic arch normal caliber with standard 3 vessel morphology. Atheromatous change present about the arch itself. No stenosis about the origin the great vessels. Right carotid system: Right common and internal carotid arteries are patent without stenosis, dissection or occlusion. Left carotid system: Left common and internal carotid arteries are patent without stenosis, dissection or occlusion. Vertebral arteries: Both vertebral arteries arise from subclavian arteries. No significant proximal subclavian artery stenosis. Vertebral arteries are patent without stenosis or dissection. Skeleton: No discrete or worrisome osseous lesions. Moderate cervical spondylosis at C3-4 through C6-7. Other neck: No other acute soft tissue abnormality within the neck. Upper chest: Irregular biapical pleuroparenchymal thickening about the lung apices with a few scattered superimposed nodular densities at the right lung apex, largest of which measures 1.1 cm (series 9, image 138). Visualized upper chest demonstrates no other acute finding. Review  of the MIP images confirms the above findings CTA HEAD FINDINGS Anterior circulation: Both internal carotid arteries are patent to the termini without stenosis. A1 segments patent bilaterally. Normal anterior  compared to complex. Anterior cerebral arteries patent without stenosis. No M1 stenosis or occlusion. Normal MCA bifurcations. No proximal MCA branch occlusion or high-grade stenosis. Distal MCA branches perfused and symmetric. Posterior circulation: Both V4 segments widely patent without stenosis. Both PICA patent. Basilar widely patent without stenosis. Superior cerebellar and posterior cerebral arteries patent bilaterally. Venous sinuses: Patent allowing for timing the contrast bolus. Anatomic variants: None significant.  No aneurysm. Review of the MIP images confirms the above findings IMPRESSION: CT HEAD IMPRESSION: 1. Previously identified punctate right frontal infarcts not visible by CT. No other acute intracranial abnormality. 2. Moderate chronic microvascular ischemic disease. CTA HEAD AND NECK IMPRESSION: 1. Negative CTA for large vessel occlusion or other emergent finding. 2. Mild for age atheromatous change about the major arterial vasculature of the head and neck. No hemodynamically significant or correctable stenosis. 3. Irregular biapical pleuroparenchymal thickening with a few scattered superimposed nodular densities at the right lung apex, largest of which measures 1.1 cm. Findings are incompletely assessed on this exam. Per Fleischner Society Guidelines, recommend prompt non-contrast Chest CT for further evaluation. Electronically Signed   By: Jeannine Boga M.D.   On: 11/01/2022 00:07   MR BRAIN WO CONTRAST  Result Date: 10/31/2022 CLINICAL DATA:  Expressive aphasia EXAM: MRI HEAD WITHOUT CONTRAST TECHNIQUE: Multiplanar, multiecho pulse sequences of the brain and surrounding structures were obtained without intravenous contrast. COMPARISON:  Brain MRI 10/18/2022 FINDINGS: Brain: There are two adjacent punctate infarcts in the right frontal lobe anteromedially (5-18). The previously seen punctate infarct in the left temporoparietal region is no longer apparent. There is no acute  intracranial hemorrhage or extra-axial fluid collection. Parenchymal volume is normal for age. The ventricles are normal in size. Patchy foci of FLAIR signal abnormality in the supratentorial white matter likely reflect sequela of chronic small-vessel ischemic change. The pituitary and suprasellar region are normal. There is no mass lesion. There is no mass effect or midline shift. Vascular: Normal flow voids. Skull and upper cervical spine: Normal marrow signal. Sinuses/Orbits: The paranasal sinuses are clear. Bilateral lens implants are in place. The globes and orbits are otherwise unremarkable. Other: None. IMPRESSION: Two adjacent punctate infarcts in the right frontal lobe. Electronically Signed   By: Valetta Mole M.D.   On: 10/31/2022 15:38   MR BRAIN WO CONTRAST  Result Date: 10/18/2022 CLINICAL DATA:  Deficit in communication due to slurred speech. EXAM: MRI HEAD WITHOUT CONTRAST TECHNIQUE: Multiplanar, multiecho pulse sequences of the brain and surrounding structures were obtained without intravenous contrast. COMPARISON:  Head CT 10/15/2022 FINDINGS: Brain: There is a punctate acute subcortical white matter infarct at the left temporoparietal junction. Patchy T2 hyperintensities in the cerebral white matter bilaterally are nonspecific but compatible with moderate chronic small vessel ischemic disease. Dilated perivascular spaces are noted in the cerebral white matter and basal ganglia. The ventricles and sulci are within normal limits for age. Vascular: Major intracranial vascular flow voids are preserved. Skull and upper cervical spine: Unremarkable bone marrow signal. Sinuses/Orbits: Bilateral cataract extraction. Paranasal sinuses and mastoid air cells are clear. Other: None. IMPRESSION: 1. Punctate acute left temporoparietal infarct. 2. Moderate chronic small vessel ischemic disease. These results will be called to the ordering clinician or representative by the Radiologist Assistant, and  communication documented in the PACS or [employer]. Electronically Signed  By: Logan Bores M.D.   On: 10/18/2022 17:56   US Carotid Bilateral  Result Date: 10/15/2022 CLINICAL DATA:  Slurred speech, visual migraines EXAM: BILATERAL CAROTID DUPLEX ULTRASOUND TECHNIQUE: Pearline Cables scale imaging, color Doppler and duplex ultrasound were performed of bilateral carotid and vertebral arteries in the neck. COMPARISON:  None Available. FINDINGS: Criteria: Quantification of carotid stenosis is based on velocity parameters that correlate the residual internal carotid diameter with NASCET-based stenosis levels, using the diameter of the distal internal carotid lumen as the denominator for stenosis measurement. The following velocity measurements were obtained: RIGHT ICA: 77/36 cm/sec CCA: 99991111 cm/sec SYSTOLIC ICA/CCA RATIO:  1.5 ECA: 41 cm/sec LEFT ICA: 64/25 cm/sec CCA: 0000000 cm/sec SYSTOLIC ICA/CCA RATIO:  1.3 ECA: 42 cm/sec RIGHT CAROTID ARTERY: Mild intimal thickening through the common carotid artery. Smooth noncalcified plaque in the bulb without significant stenosis. Normal waveforms and color Doppler signal throughout. RIGHT VERTEBRAL ARTERY:  Normal flow direction and waveform. LEFT CAROTID ARTERY: Intimal thickening through the common carotid artery. Smooth noncalcified mild plaque in the bulb. No significant stenosis. Normal waveforms and color Doppler signal throughout. LEFT VERTEBRAL ARTERY:  Normal flow direction and waveform. IMPRESSION: 1. Bilateral carotid bifurcation plaque resulting in less than 50% diameter ICA stenosis. 2. Antegrade bilateral vertebral arterial flow. Electronically Signed   By: Lucrezia Europe M.D.   On: 10/15/2022 15:29   ECHOCARDIOGRAM COMPLETE  Result Date: 10/15/2022    ECHOCARDIOGRAM REPORT   Patient Name:   TABER Advincula Date of Exam: 10/15/2022 Medical Rec #:  [id number]     Height:       65.0 in Accession #:    [id number]    Weight:       125.0 lb Date of Birth:  06-28-1951     BSA:          1.620 m Patient Age:    8 years      BP:           120/70 mmHg Patient Gender: F             HR:           86 bpm. Exam Location:  Outpatient Procedure: 2D Echo, 3D Echo, Color Doppler, Cardiac Doppler and Strain Analysis Indications:    TIA  History:        Patient has no prior history of Echocardiogram examinations.                 TIA; Risk Factors:Former Smoker. Chronic Migraine.  Sonographer:    Leavy Cella RDCS Referring Phys: [id number] [place]  1. Left ventricular ejection fraction, by estimation, is 55 to 60%. The left ventricle has normal function. The left ventricle has no regional wall motion abnormalities. Left ventricular diastolic parameters were normal.  2. Right ventricular systolic function is normal. The right ventricular size is normal.  3. The mitral valve is normal in structure. Mild mitral valve regurgitation. No evidence of mitral stenosis.  4. The aortic valve is tricuspid. Aortic valve regurgitation is trivial. Aortic valve sclerosis/calcification is present, without any evidence of aortic stenosis.  5. The inferior vena cava is normal in size with greater than 50% respiratory variability, suggesting right atrial pressure of 3 mmHg. Comparison(s): No prior Echocardiogram. FINDINGS  Left Ventricle: Left ventricular ejection fraction, by estimation, is 55 to 60%. The left ventricle has normal function. The left ventricle has no regional wall motion abnormalities. The left ventricular internal cavity size was normal in size. There is  no left ventricular hypertrophy. Left ventricular diastolic parameters were normal. Right Ventricle: The right ventricular size is normal. Right ventricular systolic function is normal. Left Atrium: Left atrial size was normal in size. Right Atrium: Right atrial size was normal in size. Pericardium: There is no evidence of pericardial effusion. Mitral Valve: The mitral valve is normal in structure. There is mild thickening of  the mitral valve leaflet(s). Mild mitral valve regurgitation. No evidence of mitral valve stenosis. Tricuspid Valve: The tricuspid valve is normal in structure. Tricuspid valve regurgitation is trivial. No evidence of tricuspid stenosis. Aortic Valve: The aortic valve is tricuspid. Aortic valve regurgitation is trivial. Aortic valve sclerosis/calcification is present, without any evidence of aortic stenosis. Pulmonic Valve: The pulmonic valve was normal in structure. Pulmonic valve regurgitation is trivial. No evidence of pulmonic stenosis. Aorta: The aortic root is normal in size and structure. Venous: The inferior vena cava is normal in size with greater than 50% respiratory variability, suggesting right atrial pressure of 3 mmHg. IAS/Shunts: The interatrial septum is aneurysmal. No atrial level shunt detected by color flow Doppler.  LEFT VENTRICLE PLAX 2D LVIDd:         3.46 cm   Diastology LVIDs:         2.36 cm   LV e' medial:    8.49 cm/s LV PW:         0.99 cm   LV E/e' medial:  8.3 LV IVS:        0.76 cm   LV e' lateral:   15.10 cm/s LVOT diam:     2.00 cm   LV E/e' lateral: 4.7 LV SV:         53 LV SV Index:   33 LVOT Area:     3.14 cm  RIGHT VENTRICLE RV Basal diam:  3.97 cm RV Mid diam:    2.85 cm RV S prime:     14.30 cm/s TAPSE (M-mode): 2.3 cm LEFT ATRIUM             Index        RIGHT ATRIUM           Index LA diam:        2.90 cm 1.79 cm/m   RA Area:     11.10 cm LA Vol (A2C):   26.3 ml 16.23 ml/m  RA Volume:   25.60 ml  15.80 ml/m LA Vol (A4C):   36.7 ml 22.65 ml/m LA Biplane Vol: 33.4 ml 20.62 ml/m  AORTIC VALVE LVOT Vmax:   86.50 cm/s LVOT Vmean:  55.000 cm/s LVOT VTI:    0.169 m  AORTA Ao Root diam: 2.80 cm Ao Asc diam:  2.80 cm MITRAL VALVE               TRICUSPID VALVE MV Area (PHT): 5.13 cm    TR Peak grad:   18.8 mmHg MV Decel Time: 148 msec    TR Vmax:        217.00 cm/s MR Peak grad: 106.1 mmHg MR Mean grad: 84.0 mmHg    SHUNTS MR Vmax:      515.00 cm/s  Systemic VTI:  0.17 m MR  Vmean:     452.0 cm/s   Systemic Diam: 2.00 cm MV E velocity: 70.70 cm/s MV A velocity: 75.50 cm/s MV E/A ratio:  0.94 Kirk Ruths MD Electronically signed by Kirk Ruths MD Signature Date/Time: 10/15/2022/11:39:23 AM    Final    CT HEAD WO CONTRAST (5MM)  Result  Date: 10/15/2022 CLINICAL DATA:  Slurred speech 3-4 days ago.  History of migraines. EXAM: CT HEAD WITHOUT CONTRAST TECHNIQUE: Contiguous axial images were obtained from the base of the skull through the vertex without intravenous contrast. RADIATION DOSE REDUCTION: This exam was performed according to the departmental dose-optimization program which includes automated exposure control, adjustment of the mA and/or kV according to patient size and/or use of iterative reconstruction technique. COMPARISON:  None Available. FINDINGS: Brain: No hydrocephalus. Chronic small vessel ischemic changes within the bilateral periventricular and subcortical white matter regions, mild to moderate in degree. Old lacunar infarcts within the bilateral basal ganglia regions. No mass, hemorrhage, edema or other evidence of acute parenchymal abnormality. No extra-axial hemorrhage. Vascular: Chronic calcified atherosclerotic changes of the large vessels at the skull base. No unexpected hyperdense vessel. Skull: Normal. Negative for fracture or focal lesion. Sinuses/Orbits: No acute finding. Other: None. IMPRESSION: 1. No acute findings. No intracranial mass, hemorrhage or edema. 2. Chronic small vessel ischemic changes in the white matter, mild to moderate in degree, and bilateral basal ganglia regions. Electronically Signed   By: Franki Cabot M.D.   On: 10/15/2022 10:37    Microbiology: No results found for this or any previous visit.  Labs: CBC: Recent Labs  Lab 10/31/22 1314 11/01/22 1513 11/02/22 0159  WBC 7.3 7.2 5.8  NEUTROABS 4.6 4.6 2.4  HGB 13.2 14.1 12.2  HCT 40.1 43.1 37.2  MCV 93.0 93.9 93.2  PLT 318 308 99991111   Basic Metabolic Panel: Recent  Labs  Lab 10/31/22 1314 11/01/22 1513 11/02/22 0159  NA 140 138 140  K 3.8 3.5 3.9  CL 104 105 110  CO2 26 23 23   GLUCOSE 97 107* 97  BUN 14 14 13   CREATININE 0.92 0.99 0.79  CALCIUM 9.4 9.3 8.4*  MG  --  2.2 2.2  PHOS  --  3.8 4.1   Liver Function Tests: Recent Labs  Lab 10/31/22 1314 11/01/22 1513 11/02/22 0159  AST 23 24 18   ALT 17 18 14   ALKPHOS 55 60 51  BILITOT 0.6 0.6 0.4  PROT 7.2 7.2 5.9*  ALBUMIN 4.6 4.4 3.5   CBG: Recent Labs  Lab 10/31/22 [place]    Discharge time spent: greater than 30 minutes.  Signed: Raiford Noble, DO [facility] 11/04/2022

## 2022-11-06 ENCOUNTER — Encounter: Payer: Self-pay | Admitting: Emergency Medicine

## 2022-11-06 ENCOUNTER — Ambulatory Visit (INDEPENDENT_AMBULATORY_CARE_PROVIDER_SITE_OTHER): Payer: Medicare Other | Admitting: Emergency Medicine

## 2022-11-06 VITALS — BP 124/70 | HR 71 | Temp 98.6°F | Ht 65.0 in | Wt 120.6 lb

## 2022-11-06 DIAGNOSIS — R7303 Prediabetes: Secondary | ICD-10-CM | POA: Diagnosis not present

## 2022-11-06 DIAGNOSIS — E7800 Pure hypercholesterolemia, unspecified: Secondary | ICD-10-CM | POA: Diagnosis not present

## 2022-11-06 DIAGNOSIS — Z8673 Personal history of transient ischemic attack (TIA), and cerebral infarction without residual deficits: Secondary | ICD-10-CM | POA: Diagnosis not present

## 2022-11-06 DIAGNOSIS — I639 Cerebral infarction, unspecified: Secondary | ICD-10-CM | POA: Diagnosis not present

## 2022-11-06 DIAGNOSIS — R9389 Abnormal findings on diagnostic imaging of other specified body structures: Secondary | ICD-10-CM | POA: Insufficient documentation

## 2022-11-06 DIAGNOSIS — D1803 Hemangioma of intra-abdominal structures: Secondary | ICD-10-CM | POA: Diagnosis not present

## 2022-11-06 DIAGNOSIS — Z6820 Body mass index (BMI) 20.0-20.9, adult: Secondary | ICD-10-CM | POA: Diagnosis not present

## 2022-11-06 NOTE — Assessment & Plan Note (Signed)
Some biapical scarring especially on the right with a pleural nodular appearance, largest nodule 8 mm.  Etiology unclear but she is a low risk patient for malignancy, does not have any other specific risk factors.  She is asymptomatic.  We will plan to repeat in 6 months, followed for clearance, persistence, interval change.  If there is progression or if she develops symptoms then we will consider diagnostics including possible navigational bronchoscopy.

## 2022-11-06 NOTE — Patient Instructions (Addendum)
We will plan to repeat your CT scan of the chest in 6 months, September 2024 Follow Dr. Lamonte Sakai in September after your CT so we can review the results together.

## 2022-11-06 NOTE — Addendum Note (Signed)
Addended by: Gavin Potters R on: 11/06/2022 01:30 PM   Modules accepted: Orders

## 2022-11-06 NOTE — Progress Notes (Signed)
Subjective:    Patient ID: Tina Murray, female    DOB: [birth year], 72 y.o.   MRN: [id number]  HPI  72 year old woman never smoker, with a history of hyperlipidemia, hypercholesterolemia, osteoporosis, migraines.  She was admitted 10/31/2022 through 11/03/2022 for an acute CVA in the left temporoparietal region.  As part of that evaluation she had had a CT angio of the head and neck.  This showed some irregular biapical pleural-parenchymal thickening and some scattered associated nodular disease prompting CT chest as below.  She is referred today for further evaluation of the pulmonary nodules. She reports She has no breathing difficulty, no cough. Good functional capacity before the CVA.  She has had bronchitis before, with a prolonged cough x 2 months.  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  Also noted was a circumscribed low-attenuation hepatic lesion 10 mm that likely represent small cysts or hemangiomas  Review of Systems As per HPI  Past Medical History:  Diagnosis Date   DDD (degenerative disc disease), lumbar    GAD (generalized anxiety disorder)    Headache    Hypercholesterolemia    Hyperglycemia    Hyperlipemia    Lumbar radiculopathy    Migraine    Osteoarthritis    Osteoporosis    Paresthesia of foot    right   Sacroiliac joint pain    Sciatica of right side    Scoliosis    Seasonal allergies      Family History  Problem Relation Age of Onset   Stroke Mother        age 16   COPD Mother    Other Father        age 35 - complications from surgery   Hyperlipidemia Brother    Heart attack Brother         age 23     Social History   Socioeconomic History   Marital status: Married    Spouse name: Not on file   Number of children: 0   Years of education: 14   Highest education level: Associate degree: occupational, Hotel manager, or vocational program  Occupational History   Occupation: Retired  Tobacco Use   Smoking status: Former    Packs/day: 1.00    Years: 2.00    Additional pack years: 0.00    Total pack years: 2.00    Types: Cigarettes    Quit date: 03/03/1979    Years since quitting: 43.7    Passive exposure: Past   Smokeless tobacco: Never  Vaping Use   Vaping Use: Never used  Substance and Sexual Activity   Alcohol use: Yes    Comment: social   Drug use: Never   Sexual activity: Not on file  Other Topics Concern   Not on file  Social History Narrative   Lives at home with husband.   Right-handed.   One cup caffeine daily.   Social Determinants of Health   Financial Resource Strain: Not on file  Food Insecurity: No Food Insecurity (11/01/2022)   Hunger Vital Sign    Worried About Running Out of Food in the Last Year:  Never true    Ran Out of Food in the Last Year: Never true  Transportation Needs: No Transportation Needs (11/01/2022)   PRAPARE - Hydrologist (Medical): No    Lack of Transportation (Non-Medical): No  Physical Activity: Not on file  Stress: Not on file  Social Connections: Not on file  Intimate Partner Violence: Not At Risk (11/01/2022)   Humiliation, Afraid, Rape, and Kick questionnaire    Fear of Current or Ex-Partner: No    Emotionally Abused: No    Physically Abused: No    Sexually Abused: No     No Known Allergies   Outpatient Medications Prior to Visit  Medication Sig Dispense Refill   aspirin EC 81 MG tablet Take 81 mg by mouth daily. Swallow whole.     clonazePAM (KLONOPIN) 0.5 MG tablet Take 0.5 mg by mouth as needed for anxiety.     clopidogrel (PLAVIX) 75 MG tablet Take 1 tablet (75 mg total)  by mouth daily for 21 days. 21 tablet 0   diclofenac sodium (VOLTAREN) 1 % GEL Apply 2 g topically as needed (pain).     famotidine (PEPCID) 10 MG tablet Take 10 mg by mouth as needed for heartburn or indigestion.     Glycerin-Hypromellose-PEG 400 (DRY EYE RELIEF DROPS OP) Apply 1 drop to eye every 4 (four) hours.     hydrocortisone 2.5 % cream Apply 1 Application topically as needed.     Rosuvastatin Calcium 10 MG CPSP Take 10 mg by mouth every evening.     No facility-administered medications prior to visit.         Objective:   Physical Exam Vitals:   11/06/22 1253  BP: 124/70  Pulse: 71  Temp: 98.6 F (37 C)  TempSrc: Oral  SpO2: 100%  Weight: 120 lb 9.6 oz (54.7 kg)  Height: 5\' 5"  (1.651 m)    Gen: Pleasant, well-nourished, in no distress,  normal affect  ENT: No lesions,  mouth clear,  oropharynx clear, no postnasal drip  Neck: No JVD, no stridor  Lungs: No use of accessory muscles, no crackles or wheezing on normal respiration, no wheeze on forced expiration  Cardiovascular: RRR, heart sounds normal, no murmur or gallops, no peripheral edema  Musculoskeletal: No deformities, no cyanosis or clubbing  Neuro: alert, awake, non focal  Skin: Warm, no lesions or rash      Assessment & Plan:   Abnormal CT of the chest Some biapical scarring especially on the right with a pleural nodular appearance, largest nodule 8 mm.  Etiology unclear but she is a low risk patient for malignancy, does not have any other specific risk factors.  She is asymptomatic.  We will plan to repeat in 6 months, followed for clearance, persistence, interval change.  If there is progression or if she develops symptoms then we will consider diagnostics including possible navigational bronchoscopy.    Baltazar Apo, MD, PhD 11/06/2022, 1:27 PM [facility] [phone] or if no answer before 7:00PM call [phone] For any issues after 7:00PM please call eLink  [phone]

## 2022-11-12 ENCOUNTER — Institutional Professional Consult (permissible substitution): Payer: Medicare Other | Admitting: Student

## 2022-11-13 NOTE — Telephone Encounter (Signed)
Ok to see Dr. Leonie Man

## 2022-11-18 ENCOUNTER — Telehealth: Payer: Self-pay

## 2022-11-18 DIAGNOSIS — L309 Dermatitis, unspecified: Secondary | ICD-10-CM | POA: Diagnosis not present

## 2022-11-18 DIAGNOSIS — L820 Inflamed seborrheic keratosis: Secondary | ICD-10-CM | POA: Diagnosis not present

## 2022-11-18 NOTE — Patient Outreach (Signed)
  Emmi Stroke Care Coordination Follow Up  11/18/2022 Name:  Tina Murray MRN:  [id number] DOB:  June 02, 1951  Subjective: Tina Murray is a 72 y.o. year old female who is a primary care patient of Jackelyn Poling, DO   An Emmi alert was on 11/17/22 received indicating patient responded to questions: Went to follow-up appointment?. I reached out by phone to follow up on the alert and spoke to Patient. She reports things are going well. She is wearing heart monitor for a few weeks to evaluate for any abnormal heart rhythms. Reviewed and addressed red alerts. Patient confirms she has seen PCP and lung MD. She has appt with cardiology on 11/28/22 and neuro on 12/24/22. Denies any issues with transportation. Patient confirms she has all her meds and taking them as directed. She denies any RN CM needs or concerns at this time.   Care Coordination Interventions:  Yes, provided   Follow up plan: No further intervention required. Patient has completed post-discharge automated calls.  Encounter Outcome:  Pt. Visit Completed   Alessandra Grout [facility] Care Management Care Management Community Coordinator Direct Phone: [phone] Toll Free: [phone] Fax: [phone]

## 2022-11-18 NOTE — Patient Outreach (Signed)
Received a red flag Emmi stroke notification for Tina Murray. I have assigned Antionette Fairy, RN to call for follow up and determine if there are any Case Management needs.    Iverson Alamin, Donivan Scull [facility] Care Management Assistant [facility] Care Management [phone]

## 2022-11-20 ENCOUNTER — Encounter: Payer: Self-pay | Admitting: Internal Medicine

## 2022-11-20 ENCOUNTER — Ambulatory Visit: Payer: Medicare Other | Attending: Internal Medicine | Admitting: Internal Medicine

## 2022-11-20 VITALS — BP 119/76 | HR 80 | Wt 125.0 lb

## 2022-11-20 DIAGNOSIS — I639 Cerebral infarction, unspecified: Secondary | ICD-10-CM | POA: Diagnosis not present

## 2022-11-20 DIAGNOSIS — E559 Vitamin D deficiency, unspecified: Secondary | ICD-10-CM

## 2022-11-20 DIAGNOSIS — M8000XD Age-related osteoporosis with current pathological fracture, unspecified site, subsequent encounter for fracture with routine healing: Secondary | ICD-10-CM | POA: Insufficient documentation

## 2022-11-20 DIAGNOSIS — H8111 Benign paroxysmal vertigo, right ear: Secondary | ICD-10-CM | POA: Insufficient documentation

## 2022-11-20 DIAGNOSIS — M19049 Primary osteoarthritis, unspecified hand: Secondary | ICD-10-CM | POA: Diagnosis not present

## 2022-11-20 NOTE — Patient Instructions (Signed)
For osteoarthritis several treatments may be beneficial:  - Topical antiinflammatory medicine such as diclofenac or Voltaren can be applied to  affected area as needed. Topical analgesics containing CBD, menthol, or lidocaine can be tried.  - Compressive gloves or sleeve can be helpful to support the joint especially if hurting or swelling with certain activities.  - Physical therapy referral can discuss exercises or activity modification to improve symptoms or strength if needed.  - Local steroid injection is an option if symptoms become worse and not controlled by the above options.

## 2022-11-20 NOTE — Progress Notes (Signed)
Office Visit Note  Patient: Tina Murray             Date of Birth: [birth year]           MRN: [id number]             PCP: Jackelyn Poling, DO Referring: Jackelyn Poling, DO Visit Date: 11/20/2022   Subjective:  Follow-up (Had a stroke, vascular surgery, has a heart monitor )   History of Present Illness: Tina Murray is a 72 y.o. female here for follow up for osteoporosis on Reclast infusion treatment restarted with infusion on January 22 did not have major side effects associated.  Unfortunately she sustained a new stroke and March 2 with left temporal parietal infarcts.  The onset of symptoms were sudden development of speaking gibberish for 3 to 4 minutes suddenly that led her to get this evaluation.  However she was then admitted back to the hospital on March 21 with recurrent episode of strokelike activity and imaging finding to punctate infarcts in the right frontal lobe.  She had a 30-day heart monitor that did not show arrhythmia.  Transthoracic echocardiogram without source of embolism.  She was started on dual antiplatelet therapy.  Previous HPI 08/21/22 Tina Murray is a 72 y.o. female here for osteoporosis.  She has previous diagnosis based on low bone density on DEXA scan but is also sustained previous right shoulder fracture also lumbar spine and pelvis fracture from falls.  She has some issue with chronic dysphagia and also severe GERD so cannot tolerate oral bisphosphonate treatment.  She was started on Reclast with an initial infusion in 2010 and she had a second dose in 2012.  There was some kind of issue she thinks she had some throat tightening raise concern for allergic reaction but never progressed into major complication she has noted similar symptoms whether perceived dyspnea or globus sensation related to her anxiety.  Regardless more recently she was started on Evenity in March of last year with Dr. Berline Chough she received 2 injections of this medication but had significant  body and muscle pain following the treatment so did not receive any additional doses. Besides the osteoporosis she also has issues with generalized osteoarthritis affecting multiple areas.  She had a recent radiofrequency ablation procedure with Dr. Alvester Morin for chronic back pain.  She has had hand surgery multiple procedures with Dr. Nelva Nay and Dr. Merlyn Lot.  She has had conservative treatment with injections with first [facility] joint osteoarthritis and trigger fingers.  She also uses topical diclofenac gel for her fingers which is helpful. She experiences mildly erythematous skin rash on the neck and upper chest without specific trigger and no residual skin discoloration.   04/12/21 DEXA Scan - [facility] L1-L4 spine                 -2.4 Left femoral neck        -3.3 Right femoral neck      -3.0     Review of Systems  Constitutional:  Negative for fatigue.  HENT:  Negative for mouth sores and mouth dryness.   Eyes:  Negative for dryness.  Respiratory:  Negative for shortness of breath.   Cardiovascular:  Negative for chest pain and palpitations.  Gastrointestinal:  Negative for blood in stool, constipation and diarrhea.  Endocrine: Negative for increased urination.  Genitourinary:  Negative for involuntary urination.  Musculoskeletal:  Positive for joint pain, joint pain, joint swelling and morning stiffness. Negative for gait problem, myalgias,  muscle weakness, muscle tenderness and myalgias.  Skin:  Negative for color change, rash, hair loss and sensitivity to sunlight.  Allergic/Immunologic: Negative for susceptible to infections.  Neurological:  Positive for dizziness. Negative for headaches.  Hematological:  Negative for swollen glands.  Psychiatric/Behavioral:  Negative for depressed mood and sleep disturbance. The patient is nervous/anxious.     PMFS History:  Patient Active Problem List   Diagnosis Date Noted  . Abnormal CT of the chest 11/06/2022  . Acute CVA  (cerebrovascular accident) 11/01/2022  . CVA (cerebral vascular accident) 10/31/2022  . Cerebrovascular accident (CVA) 10/22/2022  . Age-related osteoporosis without current pathological fracture 08/27/2022  . Osteoporosis 08/21/2022  . Vitamin D deficiency 08/21/2022  . Hypercholesterolemia 07/25/2022  . Chronic migraine w/o aura w/o status migrainosus, not intractable 01/10/2021  . Anxiety 01/10/2021  . Chronic migraine 03/03/2020  . Chronic neck pain 03/03/2020  . BPPV (benign paroxysmal positional vertigo), right 12/14/2019  . Acute recurrent pansinusitis 03/10/2018  . Arthritis of hand 02/20/2018  . Paresthesia 11/24/2017  . Neck pain 11/24/2017    Past Medical History:  Diagnosis Date  . DDD (degenerative disc disease), lumbar   . GAD (generalized anxiety disorder)   . Headache   . Hypercholesterolemia   . Hyperglycemia   . Hyperlipemia   . Lumbar radiculopathy   . Migraine   . Osteoarthritis   . Osteoporosis   . Paresthesia of foot    right  . Sacroiliac joint pain   . Sciatica of right side   . Scoliosis   . Seasonal allergies   . Stroke     Family History  Problem Relation Age of Onset  . Stroke Mother        age 40  . COPD Mother   . Other Father        age 21 - complications from surgery  . Hyperlipidemia Brother   . Heart attack Brother        age 4   Past Surgical History:  Procedure Laterality Date  . HAND SURGERY Right    Right middle finger trigger release  . OVARIAN CYST REMOVAL Left 1977   Social History   Social History Narrative   Lives at home with husband.   Right-handed.   One cup caffeine daily.   Immunization History  Administered Date(s) Administered  . Influenza, High Dose Seasonal PF 04/16/2017  . Influenza,inj,quad, With Preservative 05/12/2017  . Influenza-Unspecified 05/22/2022  . PFIZER(Purple Top)SARS-COV-2 Vaccination 09/02/2019, 09/23/2019, 06/11/2020, 11/08/2020  . Zoster Recombinat (Shingrix) 07/28/2017      Objective: Vital Signs: BP 119/76 (BP Location: Left Arm, Patient Position: Sitting, Cuff Size: Normal)   Pulse 80   Wt 125 lb (56.7 kg)   BMI 20.80 kg/m    Physical Exam   Musculoskeletal Exam: ***  CDAI Exam: CDAI Score: -- Patient Global: --; Provider Global: -- Swollen: --; Tender: -- Joint Exam 11/20/2022   No joint exam has been documented for this visit   There is currently no information documented on the homunculus. Go to the Rheumatology activity and complete the homunculus joint exam.  Investigation: No additional findings.  Imaging: VAS [place] TRANSCRANIAL DOPPLER W BUBBLES  Result Date: 11/06/2022  Transcranial Doppler with Bubble Patient Name:  Tina Murray  Date of Exam:   11/02/2022 Medical Rec #: [id number]      Accession #:    [id number] Date of Birth: 02-Jan-1951     Patient Gender: F Patient Age:   65 years Exam Location:  [facility] Procedure:      VAS [place] TRANSCRANIAL DOPPLER W BUBBLES Referring Phys: PRAMOD SETHI --------------------------------------------------------------------------------  Indications: Stroke. History: Embolic infarcts. History of migraines. Limitations: Patient talking and movement Comparison Study: No prior study on file Performing Technologist: Sherren Kerns RVS  Examination Guidelines: A complete evaluation includes B-mode imaging, spectral Doppler, color Doppler, and power Doppler as needed of all accessible portions of each vessel. Bilateral testing is considered an integral part of a complete examination. Limited examinations for reoccurring indications may be performed as noted.  Summary: No HITS at rest or during Valsalva. Negative transcranial Doppler Bubble study with no evidence of right to left intracardiac communication.  NEGATIVE TCD Bubble study with no evidence of right to left shunt noted *See table(s) above for TCD measurements and observations.  Diagnosing physician: Delia Heady MD Electronically signed by Delia Heady  MD on 11/06/2022 at 12:14:55 PM.    Final    VAS [place] LOWER EXTREMITY VENOUS (DVT)  Result Date: 11/04/2022  Lower Venous DVT Study Patient Name:  Tina Murray  Date of Exam:   11/02/2022 Medical Rec #: [id number]      Accession #:    [id number] Date of Birth: 08-21-1950     Patient Gender: F Patient Age:   6 years Exam Location:  [facility] Procedure:      VAS [place] LOWER EXTREMITY VENOUS (DVT) Referring Phys: Elmer Picker --------------------------------------------------------------------------------  Indications: Stroke.  Comparison Study: No prior study on file Performing Technologist: Sherren Kerns RVS  Examination Guidelines: A complete evaluation includes B-mode imaging, spectral Doppler, color Doppler, and power Doppler as needed of all accessible portions of each vessel. Bilateral testing is considered an integral part of a complete examination. Limited examinations for reoccurring indications may be performed as noted. The reflux portion of the exam is performed with the patient in reverse Trendelenburg.  +---------+---------------+---------+-----------+----------+--------------+ RIGHT    CompressibilityPhasicitySpontaneityPropertiesThrombus Aging +---------+---------------+---------+-----------+----------+--------------+ CFV      Full           Yes      Yes                                 +---------+---------------+---------+-----------+----------+--------------+ SFJ      Full                                                        +---------+---------------+---------+-----------+----------+--------------+ FV Prox  Full                                                        +---------+---------------+---------+-----------+----------+--------------+ FV Mid   Full           Yes      Yes                                 +---------+---------------+---------+-----------+----------+--------------+ FV DistalFull                                                         +---------+---------------+---------+-----------+----------+--------------+  PFV      Full                                                        +---------+---------------+---------+-----------+----------+--------------+ POP      Full           Yes      Yes                                 +---------+---------------+---------+-----------+----------+--------------+ PTV      Full                                                        +---------+---------------+---------+-----------+----------+--------------+ PERO     Full                                                        +---------+---------------+---------+-----------+----------+--------------+   +---------+---------------+---------+-----------+----------+--------------+ LEFT     CompressibilityPhasicitySpontaneityPropertiesThrombus Aging +---------+---------------+---------+-----------+----------+--------------+ CFV      Full           Yes      Yes                                 +---------+---------------+---------+-----------+----------+--------------+ SFJ      Full                                                        +---------+---------------+---------+-----------+----------+--------------+ FV Prox  Full                                                        +---------+---------------+---------+-----------+----------+--------------+ FV Mid   Full           Yes      Yes                                 +---------+---------------+---------+-----------+----------+--------------+ FV DistalFull                                                        +---------+---------------+---------+-----------+----------+--------------+ PFV      Full                                                        +---------+---------------+---------+-----------+----------+--------------+  POP      Full           Yes      Yes                                  +---------+---------------+---------+-----------+----------+--------------+ PTV      Full                                                        +---------+---------------+---------+-----------+----------+--------------+ PERO     Full                                                        +---------+---------------+---------+-----------+----------+--------------+     Summary: BILATERAL: - No evidence of deep vein thrombosis seen in the lower extremities, bilaterally. -No evidence of popliteal cyst, bilaterally.   *See table(s) above for measurements and observations. Electronically signed by Gerarda Fraction on 11/04/2022 at 3:42:09 AM.    Final    CT CHEST WO CONTRAST  Result Date: 11/02/2022 CLINICAL DATA:  Follow-up lung nodule greater than 8 mm. EXAM: CT CHEST WITHOUT CONTRAST TECHNIQUE: Multidetector CT imaging of the chest was performed following the standard protocol without IV contrast. RADIATION DOSE REDUCTION: This exam was performed according to the departmental dose-optimization program which includes automated exposure control, adjustment of the mA and/or kV according to patient size and/or use of iterative reconstruction technique. COMPARISON:  No prior comparison studies are available. FINDINGS: Cardiovascular: Normal heart size. No pericardial effusions. Normal caliber thoracic aorta. Scattered calcification in the aorta and coronary arteries. Mediastinum/Nodes: Thyroid gland is unremarkable. Esophagus is decompressed. No significant lymphadenopathy. Small Bochdalek's hernias containing fat. Lungs/Pleura: Nodular scarring in the lung apices bilaterally. Additional parenchymal and subpleural nodules in the right upper lung, with largest measuring 8 mm. No consolidation or airspace disease. Airways are patent. No pleural effusions. No pneumothorax. Upper Abdomen: Circumscribed low-attenuation lesions in the liver, largest measuring up to 10 mm diameter. Characterization is limited on  noncontrast imaging but these likely represent small cysts or hemangiomas. Increased density in the gallbladder may represent sludge, small stones, or milk of calcium. No inflammatory changes suggested. Musculoskeletal: Degenerative changes in the spine. Degenerative changes in the shoulders. IMPRESSION: 1. Multiple pulmonary nodules in the right upper lung. Most significant: 8 mm right solid pulmonary nodule within the upper lobe. Per Fleischner Society Guidelines, recommend a non-contrast Chest CT at 3-6 months, then consider another non-contrast Chest CT at 18-24 months. If patient is low risk for malignancy, non-contrast Chest CT at 18-24 months is optional. Comparison with any old outside studies would be useful if made available. These guidelines do not apply to immunocompromised patients and patients with cancer. Follow up in patients with significant comorbidities as clinically warranted. For lung cancer screening, adhere to Lung-RADS guidelines. Reference: Radiology. 2017; 284(1):228-43. 2. Bilateral apical pleural thickening, likely postinflammatory. 3. No evidence of active pulmonary disease. 4. Low-attenuation liver lesions measuring up to 10 mm diameter are likely small cysts or hemangiomas. 5. Increased density in the gallbladder may represent sludge, small stones, or milk of calcium. No inflammatory changes. Electronically Signed  By: Burman Nieves M.D.   On: 11/02/2022 21:48   EEG adult  Result Date: 11/02/2022 Charlsie Quest, MD     11/02/2022 11:46 AM Patient Name: Tina Murray MRN: [id number] Epilepsy Attending: Charlsie Quest Referring Physician/Provider: Mathews Argyle, NP Date: 11/02/2022 Duration: 25.16 mins Patient history: 72yo F with sudden onset aphasia. EEG to evaluate for seizure Level of alertness: Awake AEDs during EEG study: None Technical aspects: This EEG study was done with scalp electrodes positioned according to the 10-20 International system of electrode placement.  Electrical activity was reviewed with band pass filter of 1-70Hz , sensitivity of 7 uV/mm, display speed of 30mm/sec with a 60Hz  notched filter applied as appropriate. EEG data were recorded continuously and digitally stored.  Video monitoring was available and reviewed as appropriate. Description: The posterior dominant rhythm consists of 10 Hz activity of moderate voltage (25-35 uV) seen predominantly in posterior head regions, symmetric and reactive to eye opening and eye closing. Hyperventilation and photic stimulation were not performed.   IMPRESSION: This study is within normal limits. No seizures or epileptiform discharges were seen throughout the recording. A normal interictal EEG does not exclude the diagnosis of epilepsy. Charlsie Quest   CT ANGIO HEAD NECK W WO CM  Result Date: 11/01/2022 CLINICAL DATA:  Follow-up examination for acute stroke. EXAM: CT ANGIOGRAPHY HEAD AND NECK TECHNIQUE: Multidetector CT imaging of the head and neck was performed using the standard protocol during bolus administration of intravenous contrast. Multiplanar CT image reconstructions and MIPs were obtained to evaluate the vascular anatomy. Carotid stenosis measurements (when applicable) are obtained utilizing NASCET criteria, using the distal internal carotid diameter as the denominator. RADIATION DOSE REDUCTION: This exam was performed according to the departmental dose-optimization program which includes automated exposure control, adjustment of the mA and/or kV according to patient size and/or use of iterative reconstruction technique. CONTRAST:  80mL OMNIPAQUE IOHEXOL 350 MG/ML SOLN COMPARISON:  Prior MRI from earlier the same day. FINDINGS: CT HEAD FINDINGS Brain: Cerebral volume within normal limits. Moderate chronic microvascular ischemic disease again noted. Previously identified punctate right frontal infarcts not visible by CT. No other acute large vessel territory infarct. No acute intracranial hemorrhage. No  mass lesion or midline shift. No hydrocephalus or extra-axial fluid collection. Vascular: No abnormal hyperdense vessel. Scattered vascular calcifications noted within the carotid siphons. Skull: Scalp soft tissues and calvarium demonstrate no acute finding. Sinuses/Orbits: Globes and orbital soft tissues within normal limits. Paranasal sinuses and mastoid air cells are clear. Other: None. Review of the MIP images confirms the above findings CTA NECK FINDINGS Aortic arch: Visualized aortic arch normal caliber with standard 3 vessel morphology. Atheromatous change present about the arch itself. No stenosis about the origin the great vessels. Right carotid system: Right common and internal carotid arteries are patent without stenosis, dissection or occlusion. Left carotid system: Left common and internal carotid arteries are patent without stenosis, dissection or occlusion. Vertebral arteries: Both vertebral arteries arise from subclavian arteries. No significant proximal subclavian artery stenosis. Vertebral arteries are patent without stenosis or dissection. Skeleton: No discrete or worrisome osseous lesions. Moderate cervical spondylosis at C3-4 through C6-7. Other neck: No other acute soft tissue abnormality within the neck. Upper chest: Irregular biapical pleuroparenchymal thickening about the lung apices with a few scattered superimposed nodular densities at the right lung apex, largest of which measures 1.1 cm (series 9, image 138). Visualized upper chest demonstrates no other acute finding. Review of the MIP images confirms the above findings CTA  HEAD FINDINGS Anterior circulation: Both internal carotid arteries are patent to the termini without stenosis. A1 segments patent bilaterally. Normal anterior compared to complex. Anterior cerebral arteries patent without stenosis. No M1 stenosis or occlusion. Normal MCA bifurcations. No proximal MCA branch occlusion or high-grade stenosis. Distal MCA branches  perfused and symmetric. Posterior circulation: Both V4 segments widely patent without stenosis. Both PICA patent. Basilar widely patent without stenosis. Superior cerebellar and posterior cerebral arteries patent bilaterally. Venous sinuses: Patent allowing for timing the contrast bolus. Anatomic variants: None significant.  No aneurysm. Review of the MIP images confirms the above findings IMPRESSION: CT HEAD IMPRESSION: 1. Previously identified punctate right frontal infarcts not visible by CT. No other acute intracranial abnormality. 2. Moderate chronic microvascular ischemic disease. CTA HEAD AND NECK IMPRESSION: 1. Negative CTA for large vessel occlusion or other emergent finding. 2. Mild for age atheromatous change about the major arterial vasculature of the head and neck. No hemodynamically significant or correctable stenosis. 3. Irregular biapical pleuroparenchymal thickening with a few scattered superimposed nodular densities at the right lung apex, largest of which measures 1.1 cm. Findings are incompletely assessed on this exam. Per Fleischner Society Guidelines, recommend prompt non-contrast Chest CT for further evaluation. Electronically Signed   By: Rise Mu M.D.   On: 11/01/2022 00:07   MR BRAIN WO CONTRAST  Result Date: 10/31/2022 CLINICAL DATA:  Expressive aphasia EXAM: MRI HEAD WITHOUT CONTRAST TECHNIQUE: Multiplanar, multiecho pulse sequences of the brain and surrounding structures were obtained without intravenous contrast. COMPARISON:  Brain MRI 10/18/2022 FINDINGS: Brain: There are two adjacent punctate infarcts in the right frontal lobe anteromedially (5-18). The previously seen punctate infarct in the left temporoparietal region is no longer apparent. There is no acute intracranial hemorrhage or extra-axial fluid collection. Parenchymal volume is normal for age. The ventricles are normal in size. Patchy foci of FLAIR signal abnormality in the supratentorial white matter likely  reflect sequela of chronic small-vessel ischemic change. The pituitary and suprasellar region are normal. There is no mass lesion. There is no mass effect or midline shift. Vascular: Normal flow voids. Skull and upper cervical spine: Normal marrow signal. Sinuses/Orbits: The paranasal sinuses are clear. Bilateral lens implants are in place. The globes and orbits are otherwise unremarkable. Other: None. IMPRESSION: Two adjacent punctate infarcts in the right frontal lobe. Electronically Signed   By: Lesia Hausen M.D.   On: 10/31/2022 15:38    Recent Labs: Lab Results  Component Value Date   WBC 5.8 11/02/2022   HGB 12.2 11/02/2022   PLT 262 11/02/2022   NA 140 11/02/2022   K 3.9 11/02/2022   CL 110 11/02/2022   CO2 23 11/02/2022   GLUCOSE 97 11/02/2022   BUN 13 11/02/2022   CREATININE 0.79 11/02/2022   BILITOT 0.4 11/02/2022   ALKPHOS 51 11/02/2022   AST 18 11/02/2022   ALT 14 11/02/2022   PROT 5.9 (L) 11/02/2022   ALBUMIN 3.5 11/02/2022   CALCIUM 8.4 (L) 11/02/2022    Speciality Comments: No specialty comments available.  Procedures:  No procedures performed Allergies: Patient has no known allergies.   Assessment / Plan:     Visit Diagnoses: No diagnosis found.  ***  Orders: No orders of the defined types were placed in this encounter.  No orders of the defined types were placed in this encounter.    Follow-Up Instructions: No follow-ups on file.   Fuller Plan, MD  Note - This record has been created using [employer].  Chart  creation errors have been sought, but may not always  have been located. Such creation errors do not reflect on  the standard of medical care.

## 2022-11-26 ENCOUNTER — Telehealth: Payer: Self-pay | Admitting: Cardiology

## 2022-11-26 DIAGNOSIS — Z8673 Personal history of transient ischemic attack (TIA), and cerebral infarction without residual deficits: Secondary | ICD-10-CM | POA: Diagnosis not present

## 2022-11-26 DIAGNOSIS — E7800 Pure hypercholesterolemia, unspecified: Secondary | ICD-10-CM | POA: Diagnosis not present

## 2022-11-26 DIAGNOSIS — Z6820 Body mass index (BMI) 20.0-20.9, adult: Secondary | ICD-10-CM | POA: Diagnosis not present

## 2022-11-26 NOTE — Telephone Encounter (Signed)
Patient is calling because she has an appt at a with Dr. Shari Prows on 04/18. Patient stated that she is wearing a heart monitor and didn't know if she should be seen after she has her heart monitor taken off. The heart monitor comes off on 04/23. Please advise

## 2022-11-26 NOTE — Telephone Encounter (Signed)
Patient new appointment is 01/23/23 @ 0900, patient voiced understanding.

## 2022-11-26 NOTE — Telephone Encounter (Signed)
Patient stated she is currently wearing 30 heart monitor , it is scheduled to be removed on 4/23. Pt will like to know if  Dr. Shari Prows like for the pt to reschedule the appointment. Due to MD will not have the results for the monitor prior to appointment. Will forward to MD and nurse for advise.

## 2022-11-28 ENCOUNTER — Ambulatory Visit: Payer: Medicare Other | Admitting: Cardiology

## 2022-12-03 ENCOUNTER — Encounter (HOSPITAL_COMMUNITY): Payer: Self-pay

## 2022-12-03 ENCOUNTER — Observation Stay (HOSPITAL_COMMUNITY)
Admission: EM | Admit: 2022-12-03 | Discharge: 2022-12-04 | Disposition: A | Discharge status: Home / Self Care | Payer: Medicare Other | Attending: Student | Admitting: Student

## 2022-12-03 ENCOUNTER — Emergency Department (HOSPITAL_COMMUNITY): Payer: Medicare Other

## 2022-12-03 ENCOUNTER — Other Ambulatory Visit: Payer: Self-pay

## 2022-12-03 DIAGNOSIS — N179 Acute kidney failure, unspecified: Secondary | ICD-10-CM | POA: Diagnosis not present

## 2022-12-03 DIAGNOSIS — R471 Dysarthria and anarthria: Secondary | ICD-10-CM

## 2022-12-03 DIAGNOSIS — Z79899 Other long term (current) drug therapy: Secondary | ICD-10-CM | POA: Diagnosis not present

## 2022-12-03 DIAGNOSIS — Z0389 Encounter for observation for other suspected diseases and conditions ruled out: Secondary | ICD-10-CM | POA: Diagnosis not present

## 2022-12-03 DIAGNOSIS — Z7982 Long term (current) use of aspirin: Secondary | ICD-10-CM | POA: Diagnosis not present

## 2022-12-03 DIAGNOSIS — Z8673 Personal history of transient ischemic attack (TIA), and cerebral infarction without residual deficits: Secondary | ICD-10-CM

## 2022-12-03 DIAGNOSIS — E7800 Pure hypercholesterolemia, unspecified: Secondary | ICD-10-CM | POA: Diagnosis not present

## 2022-12-03 DIAGNOSIS — R9431 Abnormal electrocardiogram [ECG] [EKG]: Secondary | ICD-10-CM | POA: Diagnosis not present

## 2022-12-03 DIAGNOSIS — R4701 Aphasia: Secondary | ICD-10-CM | POA: Diagnosis not present

## 2022-12-03 DIAGNOSIS — Z87891 Personal history of nicotine dependence: Secondary | ICD-10-CM | POA: Insufficient documentation

## 2022-12-03 DIAGNOSIS — I10 Essential (primary) hypertension: Secondary | ICD-10-CM | POA: Diagnosis not present

## 2022-12-03 DIAGNOSIS — R29818 Other symptoms and signs involving the nervous system: Secondary | ICD-10-CM | POA: Diagnosis not present

## 2022-12-03 LAB — CBC WITH DIFFERENTIAL/PLATELET
Abs Immature Granulocytes: 0.02 10*3/uL (ref 0.00–0.07)
Basophils Absolute: 0.1 10*3/uL (ref 0.0–0.1)
Basophils Relative: 1 %
Eosinophils Absolute: 0.1 10*3/uL (ref 0.0–0.5)
Eosinophils Relative: 1 %
HCT: 41 % (ref 36.0–46.0)
Hemoglobin: 13.5 g/dL (ref 12.0–15.0)
Immature Granulocytes: 0 %
Lymphocytes Relative: 36 %
Lymphs Abs: 2.7 10*3/uL (ref 0.7–4.0)
MCH: 30.8 pg (ref 26.0–34.0)
MCHC: 32.9 g/dL (ref 30.0–36.0)
MCV: 93.4 fL (ref 80.0–100.0)
Monocytes Absolute: 0.7 10*3/uL (ref 0.1–1.0)
Monocytes Relative: 9 %
Neutro Abs: 4 10*3/uL (ref 1.7–7.7)
Neutrophils Relative %: 53 %
Platelets: 294 10*3/uL (ref 150–400)
RBC: 4.39 MIL/uL (ref 3.87–5.11)
RDW: 13.5 % (ref 11.5–15.5)
WBC: 7.6 10*3/uL (ref 4.0–10.5)
nRBC: 0 % (ref 0.0–0.2)

## 2022-12-03 LAB — BASIC METABOLIC PANEL
Anion gap: 13 (ref 5–15)
BUN: 20 mg/dL (ref 8–23)
CO2: 25 mmol/L (ref 22–32)
Calcium: 9.4 mg/dL (ref 8.9–10.3)
Chloride: 101 mmol/L (ref 98–111)
Creatinine, Ser: 1.16 mg/dL — ABNORMAL HIGH (ref 0.44–1.00)
GFR, Estimated: 50 mL/min — ABNORMAL LOW (ref >60)
Glucose, Bld: 90 mg/dL (ref 70–99)
Potassium: 4.2 mmol/L (ref 3.5–5.1)
Sodium: 139 mmol/L (ref 135–145)

## 2022-12-03 LAB — CBG MONITORING, ED: Glucose-Capillary: 88 mg/dL (ref 70–99)

## 2022-12-03 MED ORDER — DIAZEPAM 5 MG PO TABS: 5.0000 mg | ORAL_TABLET | Freq: Once | ORAL | Status: DC

## 2022-12-03 MED ORDER — SODIUM CHLORIDE (PF) 0.9 % IJ SOLN
INTRAMUSCULAR | Status: AC
  Filled 2022-12-03: qty 50

## 2022-12-03 MED ORDER — ROSUVASTATIN CALCIUM 5 MG PO TABS
10.0000 mg | ORAL_TABLET | Freq: Every evening | ORAL | Status: DC
  Administered 2022-12-03: 10 mg via ORAL
  Filled 2022-12-03: qty 2

## 2022-12-03 MED ORDER — ASPIRIN 81 MG PO TBEC: 81.0000 mg | DELAYED_RELEASE_TABLET | Freq: Every day | ORAL | Status: DC

## 2022-12-03 MED ORDER — LACTATED RINGERS IV SOLN: INTRAVENOUS | Status: DC

## 2022-12-03 MED ORDER — ENOXAPARIN SODIUM 30 MG/0.3ML IJ SOSY
30.0000 mg | PREFILLED_SYRINGE | INTRAMUSCULAR | Status: DC
  Administered 2022-12-04: 30 mg via SUBCUTANEOUS
  Filled 2022-12-03: qty 0.3

## 2022-12-03 MED ORDER — IOHEXOL 350 MG/ML SOLN
100.0000 mL | Freq: Once | INTRAVENOUS | Status: AC | PRN
  Administered 2022-12-03: 100 mL via INTRAVENOUS

## 2022-12-03 MED ORDER — ASPIRIN 81 MG PO TBEC
81.0000 mg | DELAYED_RELEASE_TABLET | Freq: Every day | ORAL | Status: DC
  Administered 2022-12-03: 81 mg via ORAL
  Filled 2022-12-03 (×2): qty 1

## 2022-12-03 NOTE — H&P (Signed)
History and Physical    PatientMarland Kitchen Tina Murray ZOX:[id number] DOB: [birth year] DOA: 12/03/2022 DOS: the patient was seen and examined on 12/04/2022 PCP: Tina Bur, MD  Patient coming from: Home  Chief Complaint:  Chief Complaint  Patient presents with   Aphasia        HPI: Tina Murray is a 72 y.o. female with medical history significant of chronic migraine, osteoporosis, hyperlipidemia and recent recurrent CVA on 10/12/2022, 10/31/2022  who presents with slurred speech.   Patient had punctate acute left temporoparietal infarct on 10/12/2022 and had been on aspirin. Then on 3/21 found to have acute right frontal lobe punctate infarct. She was recommended to be on DAPT with aspirin and plavix which pt initially disagree with. She was recommended to her loop record but she declined. Had TCD bubble study that was negative but neurology felt that she may have had short run of A/fib. Cardiology was consulted and recommended 30 day monitor. EEG also done without findings of seizure.   Earlier today she had an episode similar to her prior strokes. She did her routine 2 mile walk and then while talking to her husband she had garble speech for about 5 minutes and resolved. No extremity weakness. No headache or vision changes. She just completed her Plavix about a week ago. Continues to take her aspirin and statin. Her 30 day cardiac monitoring ended yesterday.  Earlier this week she has some cosmetic fillers put into her face so she has a few areas of periorbital ecchymosis.   In the ED, she was afebrile, normotensive on room.  CBC unremarkable.   BMP with mild AKI with creatinine of 1.16. No other significant electrolytes abnormalitis.   MRI negative. CTA head and neck without LVO.   EDP consulted on-call neurology who recommends she be transferred to [facility] for further workup and to rule out EEG. Hospitalist then consulted for admission.   Review of Systems: As mentioned in the history of  present illness. All other systems reviewed and are negative. Past Medical History:  Diagnosis Date   DDD (degenerative disc disease), lumbar    GAD (generalized anxiety disorder)    Headache    Hypercholesterolemia    Hyperglycemia    Hyperlipemia    Lumbar radiculopathy    Migraine    Osteoarthritis    Osteoporosis    Paresthesia of foot    right   Sacroiliac joint pain    Sciatica of right side    Scoliosis    Seasonal allergies    Stroke    Past Surgical History:  Procedure Laterality Date   HAND SURGERY Right    Right middle finger trigger release   OVARIAN CYST REMOVAL Left 1977   Social History:  reports that she quit smoking about 43 years ago. Her smoking use included cigarettes. She has a 2.00 pack-year smoking history. She has been exposed to tobacco smoke. She has never used smokeless tobacco. She reports current alcohol use. She reports that she does not use drugs.  No Known Allergies  Family History  Problem Relation Age of Onset   Stroke Mother        age 8   COPD Mother    Other Father        age 46 - complications from surgery   Hyperlipidemia Brother    Heart attack Brother        age 65    Prior to Admission medications   Medication Sig Start Date End Date  Taking? Authorizing Provider  aspirin EC 81 MG tablet Take 81 mg by mouth daily. Swallow whole.    [provider]  clonazePAM (KLONOPIN) 0.5 MG tablet Take 0.5 mg by mouth as needed for anxiety.    [provider]  diclofenac sodium (VOLTAREN) 1 % GEL Apply 2 g topically as needed (pain).    [provider]  famotidine (PEPCID) 10 MG tablet Take 10 mg by mouth as needed for heartburn or indigestion. Patient not taking: Reported on 11/20/2022    [provider]  Glycerin-Hypromellose-PEG 400 (DRY EYE RELIEF DROPS OP) Apply 1 drop to eye every 4 (four) hours.    [provider]  hydrocortisone 2.5 % cream Apply 1 Application topically as needed.  10/21/22   [provider]  Rosuvastatin Calcium 10 MG CPSP Take 15 mg by mouth every evening.    [provider]    Physical Exam: Vitals:   12/03/22 1745 12/03/22 1926 12/03/22 2237 12/03/22 2238  BP:  118/80  133/79  Pulse:  69  63  Resp:  12  12  Temp:  98 F (36.7 C)  97.7 F (36.5 C)  TempSrc:  Oral  Oral  SpO2:  100%  100%  Weight:   54.4 kg   Height: 5\' 5"  (1.651 m)  5\' 5"  (1.651 m)    Constitutional: NAD, calm, comfortable, elderly female appearing younger than stated age Eyes: lids and conjunctivae normal ENMT: Mucous membranes are moist.  Neck: normal, supple Respiratory: clear to auscultation bilaterally, no wheezing, no crackles. Normal respiratory effort. No accessory muscle use.  Cardiovascular: Regular rate and rhythm, no murmurs / rubs / gallops. No extremity edema.  Abdomen: soft, no tenderness, no masses palpated.  Musculoskeletal: no clubbing / cyanosis. No joint deformity upper and lower extremities. Good ROM, no contractures. Normal muscle tone.  Skin: ecchymosis beneath left eye, right periorbital region Neurologic: CN 2-12 grossly intact. Sensation intact,  Strength 5/5 in all 4. No facial asymmetry. Symmetric and equal bilateral shoulder shrug. Intact heal to shin. Psychiatric: Normal judgment and insight. Alert and oriented x 3. Normal mood. Data Reviewed:  See HPI  Assessment and Plan: * Expressive aphasia Hx of recurrent CVA -Hx of punctate acute left temporoparietal infarct on 10/12/2022 -placed on aspirin -Hx acute right frontal lobe punctate infarct - 10/31/22 - completed 3 week of DAPT with plavix. Now on monotherapy with aspirin - MRI brain today was negative for acute stroke -CTA head and neck without LVO.  -continue aspirin -I have discussed with on-call neurology Dr. Iver Nestle as to whether she again needs full TIA workup given her complex case and recent extensive workup. She will evaluate pt and review chart and will place  further recommendation. Possibly will need LTM EEG rather than spot EEG.   AKI (acute kidney injury) -mild AKI with elevated creatinine -keep on continuous IV fluid overnight  Hypercholesterolemia -Continue statin      Advance Care Planning: Full  Consults: neurology  Family Communication: none at bedside  Severity of Illness: The appropriate patient status for this patient is OBSERVATION. Observation status is judged to be reasonable and necessary in order to provide the required intensity of service to ensure the patient's safety. The patient's presenting symptoms, physical exam findings, and initial radiographic and laboratory data in the context of their medical condition is felt to place them at decreased risk for further clinical deterioration. Furthermore, it is anticipated that the patient will be medically stable for discharge from the  hospital within 2 midnights of admission.   Author: Anselm Jungling, DO 12/04/2022 1:44 AM  For on call review [email or web address].

## 2022-12-03 NOTE — ED Notes (Signed)
Pt in MRI.

## 2022-12-03 NOTE — Assessment & Plan Note (Signed)
Continue statin. 

## 2022-12-03 NOTE — ED Provider Notes (Signed)
[facility] DEPARTMENT AT [facility] Provider Note   CSN: [id number] Arrival date & time: 12/03/22  1533     History  Chief Complaint  Patient presents with   Aphasia         [name] is a 72 y.o. female.  72 year old female with history of stroke in the past who presents with 5 minutes of expressive aphasia.  States that this occurred about 30 minutes prior to arrival.  No other associated symptoms.  Feels back to her baseline.  Is not currently taking any antiplatelet therapy.  Denies any headache.  CBG 88.       Home Medications Prior to Admission medications   Medication Sig Start Date End Date Taking? Authorizing Provider  aspirin EC 81 MG tablet Take 81 mg by mouth daily. Swallow whole.    [provider]  clonazePAM (KLONOPIN) 0.5 MG tablet Take 0.5 mg by mouth as needed for anxiety.    [provider]  diclofenac sodium (VOLTAREN) 1 % GEL Apply 2 g topically as needed (pain).    [provider]  famotidine (PEPCID) 10 MG tablet Take 10 mg by mouth as needed for heartburn or indigestion. Patient not taking: Reported on 11/20/2022    [provider]  Glycerin-Hypromellose-PEG 400 (DRY EYE RELIEF DROPS OP) Apply 1 drop to eye every 4 (four) hours.    [provider]  hydrocortisone 2.5 % cream Apply 1 Application topically as needed. 10/21/22   [provider]  Rosuvastatin Calcium 10 MG CPSP Take 15 mg by mouth every evening.    [provider]      Allergies    Patient has no known allergies.    Review of Systems   Review of Systems  All other systems reviewed and are negative.   Physical Exam Updated Vital Signs BP 124/82   Pulse 91   Temp 98.1 F (36.7 C) (Oral)   Resp 10   SpO2 100%  Physical Exam Vitals and nursing note reviewed.  Constitutional:      General: She is not in acute distress.    Appearance: Normal appearance. She is well-developed. She is not  toxic-appearing.  HENT:     Head: Normocephalic and atraumatic.  Eyes:     General: Lids are normal.     Conjunctiva/sclera: Conjunctivae normal.     Pupils: Pupils are equal, round, and reactive to light.  Neck:     Thyroid: No thyroid mass.     Trachea: No tracheal deviation.  Cardiovascular:     Rate and Rhythm: Normal rate and regular rhythm.     Heart sounds: Normal heart sounds. No murmur heard.    No gallop.  Pulmonary:     Effort: Pulmonary effort is normal. No respiratory distress.     Breath sounds: Normal breath sounds. No stridor. No decreased breath sounds, wheezing, rhonchi or rales.  Abdominal:     General: There is no distension.     Palpations: Abdomen is soft.     Tenderness: There is no abdominal tenderness. There is no rebound.  Musculoskeletal:        General: No tenderness. Normal range of motion.     Cervical back: Normal range of motion and neck supple.  Skin:    General: Skin is warm and dry.     Findings: No abrasion or rash.  Neurological:     General: No focal deficit present.     Mental Status: She is alert  and oriented to person, place, and time. Mental status is at baseline.     GCS: GCS eye subscore is 4. GCS verbal subscore is 5. GCS motor subscore is 6.     Cranial Nerves: Cranial nerves 2-12 are intact. No cranial nerve deficit.     Sensory: No sensory deficit.     Motor: Motor function is intact.  Psychiatric:        Attention and Perception: Attention normal.        Speech: Speech normal.        Behavior: Behavior normal.     ED Results / Procedures / Treatments   Labs (all labs ordered are listed, but only abnormal results are displayed) Labs Reviewed  BASIC METABOLIC PANEL  CBC WITH DIFFERENTIAL/PLATELET  CBG MONITORING, ED    EKG EKG Interpretation  Date/Time:  Tuesday December 03 2022 15:41:25 EDT Ventricular Rate:  82 PR Interval:  143 QRS Duration: 96 QT Interval:  388 QTC Calculation: 454 R Axis:   75 Text  Interpretation: Sinus rhythm Confirmed by Lorre Nick ([id number]) on 12/03/2022 3:58:18 PM  Radiology No results found.  Procedures Procedures    Medications Ordered in ED Medications  lactated ringers infusion (has no administration in time range)    ED Course/ Medical Decision Making/ A&P                             Medical Decision Making Amount and/or Complexity of Data Reviewed Labs: ordered. Radiology: ordered.  Risk Prescription drug management.   Patient is EKG per interpretation shows normal sinus rhythm.  No signs of acute ischemic changes noted.  Case discussed with neurology on-call who recommends patient MRI as well as CT angio of head and neck.  They recommend admission for further evaluation of her symptoms.  Patient's imaging here did not show any acute findings per my review interpretation.  Will consult hospitalist team for admission at [facility].  CRITICAL CARE Performed by: Toy Baker Total critical care time: 45 minutes Critical care time was exclusive of separately billable procedures and treating other patients. Critical care was necessary to treat or prevent imminent or life-threatening deterioration. Critical care was time spent personally by me on the following activities: development of treatment plan with patient and/or surrogate as well as nursing, discussions with consultants, evaluation of patient's response to treatment, examination of patient, obtaining history from patient or surrogate, ordering and performing treatments and interventions, ordering and review of laboratory studies, ordering and review of radiographic studies, pulse oximetry and re-evaluation of patient's condition.         Final Clinical Impression(s) / ED Diagnoses Final diagnoses:  None    Rx / DC Orders ED Discharge Orders     None         Lorre Nick, MD 12/03/22 1805

## 2022-12-03 NOTE — ED Notes (Signed)
[facility] called for transportation 

## 2022-12-03 NOTE — Assessment & Plan Note (Signed)
-  Hx of punctate acute left temporoparietal infarct on 10/12/2022 -placed on aspirin -Hx acute right frontal lobe punctate infarct - 10/31/22 - completed 3 week of DAPT with plavix. Now on monotherapy with aspirin - MRI brain today was negative for acute stroke -CTA head and neck without LVO.  -continue aspirin

## 2022-12-03 NOTE — ED Triage Notes (Addendum)
30 mins PTA started having slur speech and expressive aphasia, pt states she knows what she wants to say but having difficulty talking. Lasted for 5 minutes after walking outside. CBG 88. Takes baby aspirin daily. Hx of CVAs, last one march 21st

## 2022-12-03 NOTE — ED Notes (Signed)
ED Provider at bedside. 

## 2022-12-04 ENCOUNTER — Encounter: Payer: Self-pay | Admitting: Internal Medicine

## 2022-12-04 ENCOUNTER — Other Ambulatory Visit (HOSPITAL_COMMUNITY): Payer: Self-pay

## 2022-12-04 DIAGNOSIS — N179 Acute kidney failure, unspecified: Secondary | ICD-10-CM

## 2022-12-04 DIAGNOSIS — R4701 Aphasia: Secondary | ICD-10-CM | POA: Diagnosis not present

## 2022-12-04 DIAGNOSIS — E7800 Pure hypercholesterolemia, unspecified: Secondary | ICD-10-CM | POA: Diagnosis not present

## 2022-12-04 DIAGNOSIS — Z8673 Personal history of transient ischemic attack (TIA), and cerebral infarction without residual deficits: Secondary | ICD-10-CM | POA: Diagnosis not present

## 2022-12-04 LAB — BASIC METABOLIC PANEL
Anion gap: 9 (ref 5–15)
BUN: 17 mg/dL (ref 8–23)
CO2: 24 mmol/L (ref 22–32)
Calcium: 9 mg/dL (ref 8.9–10.3)
Chloride: 106 mmol/L (ref 98–111)
Creatinine, Ser: 0.91 mg/dL (ref 0.44–1.00)
GFR, Estimated: >60 mL/min (ref >60)
Glucose, Bld: 88 mg/dL (ref 70–99)
Potassium: 3.7 mmol/L (ref 3.5–5.1)
Sodium: 139 mmol/L (ref 135–145)

## 2022-12-04 MED ORDER — TICAGRELOR 90 MG PO TABS
90.0000 mg | ORAL_TABLET | Freq: Two times a day (BID) | ORAL | 0 refills | Status: DC
  Filled 2022-12-04: qty 60, 30d supply, fill #0

## 2022-12-04 MED ORDER — LORATADINE 10 MG PO TABS
10.0000 mg | ORAL_TABLET | Freq: Every day | ORAL | Status: DC
  Administered 2022-12-04: 10 mg via ORAL
  Filled 2022-12-04: qty 1

## 2022-12-04 MED ORDER — ASPIRIN 81 MG PO TBEC
81.0000 mg | DELAYED_RELEASE_TABLET | Freq: Every day | ORAL | 0 refills | Status: AC
  Filled 2022-12-04: qty 28, 28d supply, fill #0

## 2022-12-04 MED ORDER — TICAGRELOR 90 MG PO TABS: 90.0000 mg | ORAL_TABLET | Freq: Two times a day (BID) | ORAL | Status: DC

## 2022-12-04 MED ORDER — CLOPIDOGREL BISULFATE 75 MG PO TABS
75.0000 mg | ORAL_TABLET | Freq: Every day | ORAL | 3 refills | Status: AC
  Filled 2022-12-04: qty 90, 90d supply, fill #0

## 2022-12-04 NOTE — Plan of Care (Signed)
  Problem: Education: Goal: Knowledge of General Education information will improve Description: Including pain rating scale, medication(s)/side effects and non-pharmacologic comfort measures Outcome: Adequate for Discharge   Problem: Health Behavior/Discharge Planning: Goal: Ability to manage health-related needs will improve Outcome: Adequate for Discharge   Problem: Clinical Measurements: Goal: Ability to maintain clinical measurements within normal limits will improve Outcome: Adequate for Discharge Goal: Will remain free from infection Outcome: Adequate for Discharge Goal: Diagnostic test results will improve Outcome: Adequate for Discharge Goal: Respiratory complications will improve Outcome: Adequate for Discharge Goal: Cardiovascular complication will be avoided Outcome: Adequate for Discharge   Problem: Activity: Goal: Risk for activity intolerance will decrease Outcome: Adequate for Discharge   Problem: Nutrition: Goal: Adequate nutrition will be maintained Outcome: Adequate for Discharge   Problem: Coping: Goal: Level of anxiety will decrease Outcome: Adequate for Discharge   Problem: Elimination: Goal: Will not experience complications related to bowel motility Outcome: Adequate for Discharge Goal: Will not experience complications related to urinary retention Outcome: Adequate for Discharge   Problem: Pain Managment: Goal: General experience of comfort will improve Outcome: Adequate for Discharge   Problem: Safety: Goal: Ability to remain free from injury will improve Outcome: Adequate for Discharge   Problem: Skin Integrity: Goal: Risk for impaired skin integrity will decrease Outcome: Adequate for Discharge   Problem: Education: Goal: Knowledge of disease or condition will improve Outcome: Adequate for Discharge Goal: Knowledge of secondary prevention will improve (MUST DOCUMENT ALL) Outcome: Adequate for Discharge   Problem: Ischemic Stroke/TIA  Tissue Perfusion: Goal: Complications of ischemic stroke/TIA will be minimized Outcome: Adequate for Discharge

## 2022-12-04 NOTE — Progress Notes (Signed)
Patient refused LTM EEG due to her recent Botox injection. Also patient did not like that fact of the leads being in her hair.

## 2022-12-04 NOTE — Assessment & Plan Note (Signed)
-  mild AKI with elevated creatinine -keep on continuous IV fluid overnight

## 2022-12-04 NOTE — Discharge Summary (Signed)
Physician Discharge Summary  Tina Murray ZOX:[id number] DOB: March 17, 1951 DOA: 12/03/2022  PCP: Orpha Bur, MD  Admit date: 12/03/2022 Discharge date: 12/04/2022 Admitted From: Home. Disposition: Home. Recommendations for Outpatient Follow-up:  Follow up with PCP in 1 week Check CMP and CBC at follow-up Recommend self review of home medications including OTCs Please follow up on the following pending results: None  Home Health: Not indicated Equipment/Devices: Not indicated  Discharge Condition: Stable CODE STATUS: Full code  Follow-up Information     Orpha Bur, MD. Schedule an appointment as soon as possible for a visit in 1 week(s).   Specialty: Family Medicine Contact information: [address] [place] [address] [phone]                HPI: 72 y.o. female with medical history significant of chronic migraine, osteoporosis, hyperlipidemia and recent recurrent CVA on 10/12/2022, 10/31/2022  who presents with slurred speech.    Patient had punctate acute left temporoparietal infarct on 10/12/2022 and had been on aspirin. Then on 3/21 found to have acute right frontal lobe punctate infarct. She was recommended to be on DAPT with aspirin and plavix which pt initially disagree with. She was recommended to her loop record but she declined. Had TCD bubble study that was negative but neurology felt that she may have had short run of A/fib. Cardiology was consulted and recommended 30 day monitor. EEG also done without findings of seizure.    Earlier today she had an episode similar to her prior strokes. She did her routine 2 mile walk and then while talking to her husband she had garble speech for about 5 minutes and resolved. No extremity weakness. No headache or vision changes. She just completed her Plavix about a week ago. Continues to take her aspirin and statin. Her 30 day cardiac monitoring ended yesterday.  Earlier this week she has some cosmetic fillers put  into her face so she has a few areas of periorbital ecchymosis.    In the ED, she was afebrile, normotensive on room.  CBC unremarkable.    BMP with mild AKI with creatinine of 1.16. No other significant electrolytes abnormalitis.    MRI negative. CTA head and neck without LVO.    EDP consulted on-call neurology who recommends she be transferred to [facility] for further workup and to rule out EEG. Hospitalist then consulted for admission.   Hospital course Stroke workup including CT head and MRI brain without acute finding.  Neuroexam intact.  Patient remained asymptomatic.  Neurology recommended EEG but patient declined.  Cleared for discharge by neurology on Brilinta and aspirin for 4 weeks followed by Plavix alone.  Neurology to follow-up on patient's event monitor from the last 30 days that has not officially resulted yet.    AKI resolved at time of discharge.  See individual problem list below for more.   Problems addressed during this hospitalization Principal Problem:   Expressive aphasia Active Problems:   Hypercholesterolemia   History of CVA (cerebrovascular accident)   AKI (acute kidney injury)              Time spent 35 minutes  Vital signs Vitals:   12/03/22 2238 12/04/22 0325 12/04/22 0828 12/04/22 1152  BP: 133/79 104/65 114/72 127/77  Pulse: 63 (!) 59 87 65  Temp: 97.7 F (36.5 C) (!) 97.5 F (36.4 C) 98.3 F (36.8 C) 98.8 F (37.1 C)  Resp: 12 18 16 20   Height:  Weight:      SpO2: 100% 98% 100% 100%  TempSrc: Oral Oral Oral Oral  BMI (Calculated):         Discharge exam  GENERAL: No apparent distress.  Nontoxic. HEENT: MMM.  Vision and hearing grossly intact.  NECK: Supple.  No apparent JVD.  RESP:  No IWOB.  Fair aeration bilaterally. CVS:  RRR. Heart sounds normal.  ABD/GI/GU: BS+. Abd soft, NTND.  MSK/EXT:  Moves extremities. No apparent deformity. No edema.  SKIN: no apparent skin lesion or wound NEURO: Awake and alert. Oriented  appropriately.  No apparent focal neuro deficit. PSYCH: Calm. Normal affect.    Discharge Instructions Discharge Instructions     Diet general   Complete by: As directed    Discharge instructions   Complete by: As directed    It has been a pleasure taking care of you!  You were hospitalized due to brief difficulty speaking.  Unclear what caused your symptoms.  MRI of brain did not show stroke.  We have started you on new medication per recommendation by neurology to decrease your risk of stroke.  We also recommend not taking Chlorpheniramine and reviewing the rest of your medications with your primary care doctor.  Please review your new medication list and the directions on your medications before you take them.  Follow-up with your primary care doctor 1 to 2 weeks or sooner if needed.     Take care,   Increase activity slowly   Complete by: As directed       Allergies as of 12/04/2022   No Known Allergies      Medication List     STOP taking these medications    ALLERGY PO       TAKE these medications    Aspirin Low Dose 81 MG tablet Generic drug: aspirin EC Take 1 tablet (81 mg total) by mouth daily for 28 days. Swallow whole.   Brilinta 90 MG Tabs tablet Generic drug: ticagrelor Take 1 tablet (90 mg total) by mouth 2 (two) times daily.   CALCIUM PO Take 1 tablet by mouth daily.   clopidogrel 75 MG tablet Commonly known as: Plavix Take 1 tablet (75 mg total) by mouth daily. Start this after 4 weeks once you are done with Brilinta and aspirin. Start taking on: Jan 01, 2023   OVER THE COUNTER MEDICATION Take 1 tablet by mouth 2 (two) times daily. Viviscal hair growth supplements   Rosuvastatin Calcium 10 MG Cpsp Take 10 mg by mouth every evening.        Consultations: Neurology  Procedures/Studies:     CT ANGIO HEAD NECK W WO CM  Result Date: 12/03/2022 CLINICAL DATA:  Stroke suspected EXAM: CT ANGIOGRAPHY HEAD AND NECK WITH AND WITHOUT  CONTRAST TECHNIQUE: Multidetector CT imaging of the head and neck was performed using the standard protocol during bolus administration of intravenous contrast. Multiplanar CT image reconstructions and MIPs were obtained to evaluate the vascular anatomy. Carotid stenosis measurements (when applicable) are obtained utilizing NASCET criteria, using the distal internal carotid diameter as the denominator. RADIATION DOSE REDUCTION: This exam was performed according to the departmental dose-optimization program which includes automated exposure control, adjustment of the mA and/or kV according to patient size and/or use of iterative reconstruction technique. CONTRAST:  OMNIPAQUE IOHEXOL 350 MG/ML SOLN COMPARISON:  Same day brain MRI, CTA head / neck 10/31/22 FINDINGS: CT HEAD FINDINGS Brain: No evidence of acute infarction, hemorrhage, hydrocephalus, extra-axial collection or mass lesion/mass effect. Sequela  of moderate chronic microvascular ischemic change. Vascular: No hyperdense vessel or unexpected calcification. Skull: Normal. Negative for fracture or focal lesion. Sinuses/Orbits: No middle ear or mastoid effusion. Paranasal sinuses are clear. Bilateral lens replacement. Orbits are otherwise unremarkable. Other: None. Review of the MIP images confirms the above findings CTA NECK FINDINGS Aortic arch: Standard branching. Imaged portion shows no evidence of aneurysm or dissection. No significant stenosis of the major arch vessel origins. Right carotid system: No evidence of dissection, stenosis (50% or greater), or occlusion. Left carotid system: No evidence of dissection, stenosis (50% or greater), or occlusion. Vertebral arteries: Codominant. No evidence of dissection, stenosis (50% or greater), or occlusion. Skeleton: Negative. Other neck: Negative. Upper chest: Redemonstrated are peripheral nodular opacities in the right upper lobe, similar to 10/31/2022. Recommend further evaluation with a chest CT, if not  previously performed. Review of the MIP images confirms the above findings CTA HEAD FINDINGS Anterior circulation: No significant stenosis, proximal occlusion, aneurysm, or vascular malformation. Posterior circulation: No significant stenosis, proximal occlusion, aneurysm, or vascular malformation. Venous sinuses: As permitted by contrast timing, patent. Anatomic variants: None Review of the MIP images confirms the above findings IMPRESSION: 1. No acute intracranial abnormality. Sequela of moderate chronic microvascular ischemic change. 2. No intracranial large vessel occlusion or significant stenosis. 3. No hemodynamically significant stenosis in the neck. 4. Redemonstrated are peripheral nodular opacities in the right upper lobe, similar to 10/31/2022. Recommend further evaluation with a chest CT, if not previously performed. Electronically Signed   By: Lorenza Cambridge M.D.   On: 12/03/2022 17:57   MR Brain Wo Contrast (neuro protocol)  Result Date: 12/03/2022 CLINICAL DATA:  Neuro deficit, stroke suspected, slurred speech and expressive aphasia EXAM: MRI HEAD WITHOUT CONTRAST TECHNIQUE: Multiplanar, multiecho pulse sequences of the brain and surrounding structures were obtained without intravenous contrast. COMPARISON:  10/31/2022 FINDINGS: Brain: No restricted diffusion to suggest acute or subacute infarct. Previously noted punctate foci of restricted diffusion are no longer seen. No acute hemorrhage, mass, mass effect, or midline shift. No hydrocephalus or extra-axial collection. Scattered T2 hyperintense signal in the periventricular white matter, likely the sequela of chronic small vessel ischemic disease. Diffuse dilated perivascular spaces. Vascular: Normal arterial flow voids. Skull and upper cervical spine: Normal marrow signal. Sinuses/Orbits: Clear paranasal sinuses. No acute finding in the orbits. Status post bilateral lens replacements. Other: The mastoid air cells are well aerated. IMPRESSION: No  acute intracranial process. No evidence of acute or subacute infarct. Electronically Signed   By: Wiliam Ke M.D.   On: 12/03/2022 17:09       The results of significant diagnostics from this hospitalization (including imaging, microbiology, ancillary and laboratory) are listed below for reference.     Microbiology: No results found for this or any previous visit (from the past 240 hour(s)).   Labs:  CBC: Recent Labs  Lab 12/03/22 1530  WBC 7.6  NEUTROABS 4.0  HGB 13.5  HCT 41.0  MCV 93.4  PLT 294   BMP &GFR Recent Labs  Lab 12/03/22 1530 12/04/22 0339  NA 139 139  K 4.2 3.7  CL 101 106  CO2 25 24  GLUCOSE 90 88  BUN 20 17  CREATININE 1.16* 0.91  CALCIUM 9.4 9.0   Estimated Creatinine Clearance: 48.7 mL/min (by C-G formula based on SCr of 0.91 mg/dL). Liver & Pancreas: No results for input(s): "AST", "ALT", "ALKPHOS", "BILITOT", "PROT", "ALBUMIN" in the last 168 hours. No results for input(s): "LIPASE", "AMYLASE" in the last 168 hours.  No results for input(s): "AMMONIA" in the last 168 hours. Diabetic: No results for input(s): "HGBA1C" in the last 72 hours. Recent Labs  Lab 12/03/22 1540  GLUCAP 88   Cardiac Enzymes: No results for input(s): "CKTOTAL", "CKMB", "CKMBINDEX", "TROPONINI" in the last 168 hours. No results for input(s): "PROBNP" in the last 8760 hours. Coagulation Profile: No results for input(s): "INR", "PROTIME" in the last 168 hours. Thyroid Function Tests: No results for input(s): "TSH", "T4TOTAL", "FREET4", "T3FREE", "THYROIDAB" in the last 72 hours. Lipid Profile: No results for input(s): "CHOL", "HDL", "LDLCALC", "TRIG", "CHOLHDL", "LDLDIRECT" in the last 72 hours. Anemia Panel: No results for input(s): "VITAMINB12", "FOLATE", "FERRITIN", "TIBC", "IRON", "RETICCTPCT" in the last 72 hours. Urine analysis: No results found for: "COLORURINE", "APPEARANCEUR", "LABSPEC", "PHURINE", "GLUCOSEU", "HGBUR", "BILIRUBINUR", "KETONESUR",  "PROTEINUR", "UROBILINOGEN", "NITRITE", "LEUKOCYTESUR" Sepsis Labs: Invalid input(s): "PROCALCITONIN", "LACTICIDVEN"   SIGNED:  Almon Hercules, MD  [facility] 12/04/2022, 5:31 PM

## 2022-12-04 NOTE — Evaluation (Signed)
Speech Language Pathology Evaluation Patient Details Name: Tina Murray MRN: [id number] DOB: [birth year] Today's Date: 12/04/2022 Time: 4098-1191 SLP Time Calculation (min) (ACUTE ONLY): 44 min  Problem List:  Patient Active Problem List   Diagnosis Date Noted   AKI (acute kidney injury) 12/04/2022   History of CVA (cerebrovascular accident) 12/03/2022   Expressive aphasia 12/03/2022   Abnormal CT of the chest 11/06/2022   Acute CVA (cerebrovascular accident) 11/01/2022   CVA (cerebral vascular accident) 10/31/2022   Cerebrovascular accident (CVA) 10/22/2022   Age-related osteoporosis without current pathological fracture 08/27/2022   Osteoporosis 08/21/2022   Vitamin D deficiency 08/21/2022   Hypercholesterolemia 07/25/2022   Chronic migraine w/o aura w/o status migrainosus, not intractable 01/10/2021   Anxiety 01/10/2021   Chronic migraine 03/03/2020   Chronic neck pain 03/03/2020   BPPV (benign paroxysmal positional vertigo), right 12/14/2019   Acute recurrent pansinusitis 03/10/2018   Arthritis of hand 02/20/2018   Paresthesia 11/24/2017   Neck pain 11/24/2017   Past Medical History:  Past Medical History:  Diagnosis Date   DDD (degenerative disc disease), lumbar    GAD (generalized anxiety disorder)    Headache    Hypercholesterolemia    Hyperglycemia    Hyperlipemia    Lumbar radiculopathy    Migraine    Osteoarthritis    Osteoporosis    Paresthesia of foot    right   Sacroiliac joint pain    Sciatica of right side    Scoliosis    Seasonal allergies    Stroke    Past Surgical History:  Past Surgical History:  Procedure Laterality Date   HAND SURGERY Right    Right middle finger trigger release   OVARIAN CYST REMOVAL Left 1977   HPI:  Patient is presenting with a third recurrent episode of transient aphasia. Per MD, TIA versus focal seizure versus functional speech impairment.   Assessment / Plan / Recommendation Clinical Impression   Cognitive-linguistic evaluation complete. Today, patient's speech and language function is intact with no evidence of aphasia. She does have a mild working memory deficit which she reports started prior to these episodes of aphasia which have brought her to the hospital. She presents as very anxious, reports this as well, and reports that she is under a significant amount of stress due to her husbands medical condition. While this may impact attention and short term memory, it does not appear to be the cause of the transient aphasia episodes. ? Favor TIA as they do not at this time seem progressive in nature but rather with sudden onset and quick return to baseline. Patient independent at this time with compensatory strategies for mild memory deficit. No f/u SLP treatment indicated at this time however question need for neuro f/u as OP and would certainly recommend SLP f/u as OP if aphasia episodes increase or progress in nature.    SLP Assessment  SLP Recommendation/Assessment: Patient does not need any further Speech Lanaguage Pathology Services SLP Visit Diagnosis: Aphasia (R47.01)    Recommendations for follow up therapy are one component of a multi-disciplinary discharge planning process, led by the attending physician.  Recommendations may be updated based on patient status, additional functional criteria and insurance authorization.    Follow Up Recommendations  No SLP follow up    Assistance Recommended at Discharge  None           SLP Evaluation Cognition  Arousal/Alertness: Awake/alert Orientation Level: Oriented X4 Memory: Impaired Memory Impairment: Storage deficit;Retrieval deficit Awareness: Appears intact Problem Solving:  Appears intact Safety/Judgment: Appears intact Comments: very anxious       Comprehension  Auditory Comprehension Overall Auditory Comprehension: Appears within functional limits for tasks assessed Visual Recognition/Discrimination Discrimination:  Within Function Limits Reading Comprehension Reading Status: Within funtional limits    Expression Expression Primary Mode of Expression: Verbal Verbal Expression Overall Verbal Expression: Appears within functional limits for tasks assessed   Oral / Motor  Oral Motor/Sensory Function Overall Oral Motor/Sensory Function: Within functional limits Motor Speech Overall Motor Speech: Appears within functional limits for tasks assessed           Ferdinand Lango MA, CCC-SLP  [name] [name] 12/04/2022, 1:18 PM

## 2022-12-04 NOTE — TOC Transition Note (Signed)
Transition of Care [facility]) - CM/SW Discharge Note   Patient Details  Name: Christen Wardrop MRN: [id number] Date of Birth: Jun 23, 1951  [facility] CM/SW Contact:  Kermit Balo, RN Phone Number: 12/04/2022, 3:14 PM   Clinical Narrative:    Pt is discharging home with self care. She lives at [facility] with her spouse.  No TOC needs.  Pt has transportation home.   Final next level of care: Home/Self Care Barriers to Discharge: No Barriers Identified   Patient Goals and CMS Choice      Discharge Placement                         Discharge Plan and Services Additional resources added to the After Visit Summary for                                       Social Determinants of Health (SDOH) Interventions SDOH Screenings   Food Insecurity: No Food Insecurity (12/03/2022)  Housing: Low Risk  (12/03/2022)  Transportation Needs: No Transportation Needs (12/03/2022)  Utilities: Not At Risk (12/03/2022)  Tobacco Use: Medium Risk (12/03/2022)     Readmission Risk Interventions     No data to display

## 2022-12-04 NOTE — Consult Note (Signed)
Neurology Consultation Reason for Consult: Recurrent transient aphasia Requesting Physician: Candelaria Stagers  CC: 5 minutes of gibberish speech   History is obtained from: Patient and chart review  HPI: Tina Murray is a 72 y.o. female with past medical history significant for hyperlipidemia, migraine headaches, osteoarthritis, osteoporosis, intermittent neck pain, anxiety, and recurrent episodes of transient expressive aphasia with 2 prior punctate strokes on MRI (10/12/2022 and 10/31/2022).  She now presents with her third episode of transient difficulty speaking.  She notes she can tell that she is speaking gibberish and can get some words out properly but a few of her words will be garbled/nonsensical.  Each episode has lasted less than 5 minutes although she thinks that the latest episode lasted approximately 5 minutes.  The first episode happened shortly after she had completed a 2 mile walk (her usual routine), the second episode happened on 3/21 while she was eating lunch with her husband, and the third episode happened again shortly after she completed her 2 mile walk yesterday.  She denies any other associated focal neurological symptoms, other than some transient lateral diplopia she associated with Botox treatment.  Denies any seizure-like activity.  She has had extensive stroke workup for these episodes including echocardiogram, CTA head and neck, transcranial Doppler studies, lower extremity duplex studies, and recently completed 30-day event monitor (mailed out to her cardiologist for you review yesterday).  She is very hesitant about pursuing loop recorder as she does not like the idea of a foreign body implanted.  She endorses a great deal of stress as she notes her husband has been struggling with CNS vasculitis being managed at [facility] in addition to other stressors in her life.  She prefers to minimize medications and interventions when possible but is also very anxious about  determining the etiology of these episodes  LKW: 2:30 PM on 4/23 Thrombolytic given?: No, symptoms resolved IA performed?: No, symptom resolved Premorbid modified rankin scale:      0 - No symptoms.    ROS: All other review of systems was negative except as noted in the HPI.   Past Medical History:  Diagnosis Date   DDD (degenerative disc disease), lumbar    GAD (generalized anxiety disorder)    Headache    Hypercholesterolemia    Hyperglycemia    Hyperlipemia    Lumbar radiculopathy    Migraine    Osteoarthritis    Osteoporosis    Paresthesia of foot    right   Sacroiliac joint pain    Sciatica of right side    Scoliosis    Seasonal allergies    Stroke    Past Surgical History:  Procedure Laterality Date   HAND SURGERY Right    Right middle finger trigger release   OVARIAN CYST REMOVAL Left 1977   Current Outpatient Medications  Medication Instructions   aspirin EC 81 mg, Oral, Daily, Swallow whole.   CALCIUM PO 1 tablet, Oral, Daily   Chlorpheniramine Maleate (ALLERGY PO) 1 tablet, Oral, Daily   OVER THE COUNTER MEDICATION 1 tablet, Oral, 2 times daily, Viviscal hair growth supplements   Rosuvastatin Calcium 10 mg, Oral, Every evening     Family History  Problem Relation Age of Onset   Stroke Mother        age 60   COPD Mother    Other Father        age 18 - complications from surgery   Hyperlipidemia Brother    Heart attack Brother  age 64    Social History:  reports that she quit smoking about 43 years ago. Her smoking use included cigarettes. She has a 2.00 pack-year smoking history. She has been exposed to tobacco smoke. She has never used smokeless tobacco. She reports current alcohol use. She reports that she does not use drugs.   Exam: Current vital signs: BP 104/65 (BP Location: Right Arm)   Pulse (!) 59   Temp (!) 97.5 F (36.4 C) (Oral)   Resp 18   Ht 5\' 5"  (1.651 m)   Wt 54.4 kg   SpO2 98%   BMI 19.97 kg/m  Vital signs in  last 24 hours: Temp:  [97.5 F (36.4 C)-98.1 F (36.7 C)] 97.5 F (36.4 C) (04/24 0325) Pulse Rate:  [59-91] 59 (04/24 0325) Resp:  [10-18] 18 (04/24 0325) BP: (104-133)/(65-82) 104/65 (04/24 0325) SpO2:  [98 %-100 %] 98 % (04/24 0325) Weight:  [54.4 kg] 54.4 kg (04/23 2237)   Physical Exam  Constitutional: Appears well-developed and well-nourished, younger than stated age Psych: Affect appropriate to situation, anxious but cooperative and pleasant Eyes: No scleral injection HENT: No oropharyngeal obstruction.  MSK: no major joint deformities, some arthritic changes.  Cardiovascular: Normal rate and regular rhythm. Perfusing extremities well Respiratory: Effort normal, non-labored breathing GI: Soft.  No distension. There is no tenderness.  Skin: Warm dry and intact visible skin, other than some left facial ecchymosis from recent fillers  Neuro: Mental Status: Patient is awake, alert, oriented to person, place, month, year, and situation, just slightly off on the exact date (21st instead of 24th) Patient is able to give a clear and coherent history. No signs of aphasia or neglect, occasional very mild pauses when finding words potentially secondary to anxiety Cranial Nerves: II: Visual Fields are full. Pupils are equal, round, and reactive to light and accommodation.   III,IV, VI: EOMI without ptosis or diploplia.  Smooth pursuits V: Facial sensation is symmetric to light touch VII: Facial movement is symmetric.  VIII: hearing is intact to voice X: Uvula elevates symmetrically XI: Shoulder shrug is symmetric. XII: tongue is midline without atrophy or fasciculations.  Motor: Tone is normal. Bulk is normal. 5/5 strength was present in all four extremities.  Sensory: Sensation is symmetric to light touch and temperature in the arms and legs Deep Tendon Reflexes: 3+ and symmetric in the brachioradialis and patellae.  Cerebellar: FNF and HKS are intact bilaterally Gait:  Able  to rise on heels and toes.  Able to tandem gait.  NIHSS total 0  I have reviewed labs in epic and the results pertinent to this consultation are:  Basic Metabolic Panel: Recent Labs  Lab 12/03/22 1530 12/04/22 0339  NA 139 139  K 4.2 3.7  CL 101 106  CO2 25 24  GLUCOSE 90 88  BUN 20 17  CREATININE 1.16* 0.91  CALCIUM 9.4 9.0    CBC: Recent Labs  Lab 12/03/22 1530  WBC 7.6  NEUTROABS 4.0  HGB 13.5  HCT 41.0  MCV 93.4  PLT 294    Coagulation Studies: No results for input(s): "LABPROT", "INR" in the last 72 hours.    I have reviewed the images obtained:  12/03/2022 MRI brain personally reviewed, agree with radiology:   No acute Intracranial process. No evidence of acute or subacute infarct.  11/03/2022 MRI brain personally reviewed, agree with radiology:   Two adjacent punctate infarcts in the right frontal lobe.  10/18/2022 MRI brain personally reviewed, agree with radiology:  1. Punctate acute left temporoparietal infarct. 2. Moderate chronic small vessel ischemic disease.   CTA head/neck 12/03/2022 1. No acute intracranial abnormality. Sequela of moderate chronic microvascular ischemic change. 2. No intracranial large vessel occlusion or significant stenosis. 3. No hemodynamically significant stenosis in the neck. 4. Redemonstrated are peripheral nodular opacities in the right upper lobe, similar to 10/31/2022. Recommend further evaluation with a chest CT, if not previously performed.   11/02/2022 CT chest 1. Multiple pulmonary nodules in the right upper lung. Most significant: 8 mm right solid pulmonary nodule within the upper lobe. Per Fleischner Society Guidelines, recommend a non-contrast Chest CT at 3-6 months, then consider another non-contrast Chest CT at 18-24 months. If patient is low risk for malignancy, non-contrast Chest CT at 18-24 months is optional. Comparison with any old outside studies would be useful if made available. These  guidelines do not apply to immunocompromised patients and patients with cancer. Follow up in patients with significant comorbidities as clinically warranted. For lung cancer screening, adhere to Lung-RADS guidelines. Reference: Radiology. 2017; 284(1):228-43. 2. Bilateral apical pleural thickening, likely postinflammatory. 3. No evidence of active pulmonary disease. 4. Low-attenuation liver lesions measuring up to 10 mm diameter are likely small cysts or hemangiomas. 5. Increased density in the gallbladder may represent sludge, small stones, or milk of calcium. No inflammatory changes.   ECHO  1. Left ventricular ejection fraction, by estimation, is 55 to 60%. The  left ventricle has normal function. The left ventricle has no regional  wall motion abnormalities. Left ventricular diastolic parameters were  normal.   2. Right ventricular systolic function is normal. The right ventricular  size is normal.   3. The mitral valve is normal in structure. Mild mitral valve  regurgitation. No evidence of mitral stenosis.   4. The aortic valve is tricuspid. Aortic valve regurgitation is trivial.  Aortic valve sclerosis/calcification is present, without any evidence of  aortic stenosis.   5. The inferior vena cava is normal in size with greater than 50%  respiratory variability, suggesting right atrial pressure of 3 mmHg.   11/04/2022 event monitor Report pending,   11/02/2022  TCD negative 11/02/2022  LE Duplex negative  Impression: Patient is presenting with a third recurrent episode of transient aphasia.  She describes these as very stereotyped events which is somewhat atypical for a central embolic source; would expect different transient neurological symptoms from different vascular territories being affected typically.  However in favor of embolic etiology, on her MRIs on 3/2 and 3/21 she did have punctate strokes.  Focal seizure is another consideration; negative routine EEG is a fairly  low sensitivity test for this diagnosis and a long-term EEG would be reasonable next step in diagnosis.  We did discuss that the likelihood of capturing a spell is low given low frequency of events, but long-term EEG monitoring for over 24 hours has a much higher sensitivity (up to 90% or so) for excluding epilepsy  Recommendations: #TIA versus focal seizure versus functional speech impairment -Review of event monitor mailed out yesterday if possible -Long-term EEG monitoring to evaluate for potential ictal/interictal discharges -If event monitor is negative and EEG is negative, reconsideration of loop recorder -Stroke team will follow  Brooke Dare MD-PhD [facility] [phone] Available 7 AM to 7 PM, outside these hours please contact Neurologist on call listed on AMION

## 2022-12-04 NOTE — Progress Notes (Addendum)
STROKE TEAM PROGRESS NOTE   INTERVAL HISTORY Her husband is at the bedside.  He is sitting on the side of the bed in no apparent distress.  He is a bit anxious.  She states she had a very similar episode to her last 2 of acute onset of aphasia lasting under 5 minutes and has resolved.  She had an extensive stroke workup on prior admission.  She did have a 30-day heart monitor and that was mailed back to the office just a few days ago the results are not in.  She has refused a loop in the past This morning she has refused LTM. Neurological exam is nonfocal.   She was only able to recall 1 out of 3 words, only able to name about 5 animals on 4 legs.  Will ask speech therapy to do a cognitive exam on her.  She appeared quite anxious during the exam MRI scan of the brain 10/31/2022 showed 2 tiny right frontal punctate infarcts and MRI scan of the brain 10/18/2022 showed tiny punctate left temporoparietal infarct.  Patient has had a 30-day heart monitor which returned yesterday and results are not back yet.  She had previously refused loop recorder.  EEG on 11/02/2022 was normal He complains of mild short-term memory difficulties which she has had for a year or so.  He admits to anxiety and stress but is mostly independent in all activities of daily living. Vitals:   12/03/22 2238 12/04/22 0325 12/04/22 0828 12/04/22 1152  BP: 133/79 104/65 114/72 127/77  Pulse: 63 (!) 59 87 65  Resp: Temp: 97.7 F (36.5 C) (!) 97.5 F (36.4 C) 98.3 F (36.8 C) 98.8 F (37.1 C)  TempSrc: Oral Oral Oral Oral  SpO2: 100% 98% 100% 100%  Weight:      Height:       CBC:  Recent Labs  Lab 12/03/22 1530  WBC 7.6  NEUTROABS 4.0  HGB 13.5  HCT 41.0  MCV 93.4  PLT 294   Basic Metabolic Panel:  Recent Labs  Lab 12/03/22 1530 12/04/22 0339  NA 139 139  K 4.2 3.7  CL 101 106  CO2 25 24  GLUCOSE 90 88  BUN 20 17  CREATININE 1.16* 0.91  CALCIUM 9.4 9.0   Lipid Panel: No results for input(s):  "CHOL", "TRIG", "HDL", "CHOLHDL", "VLDL", "LDLCALC" in the last 168 hours. HgbA1c: No results for input(s): "HGBA1C" in the last 168 hours. Urine Drug Screen: No results for input(s): "LABOPIA", "COCAINSCRNUR", "LABBENZ", "AMPHETMU", "THCU", "LABBARB" in the last 168 hours.  Alcohol Level No results for input(s): "ETH" in the last 168 hours.  IMAGING past 24 hours CT ANGIO HEAD NECK W WO CM  Result Date: 12/03/2022 CLINICAL DATA:  Stroke suspected EXAM: CT ANGIOGRAPHY HEAD AND NECK WITH AND WITHOUT CONTRAST TECHNIQUE: Multidetector CT imaging of the head and neck was performed using the standard protocol during bolus administration of intravenous contrast. Multiplanar CT image reconstructions and MIPs were obtained to evaluate the vascular anatomy. Carotid stenosis measurements (when applicable) are obtained utilizing NASCET criteria, using the distal internal carotid diameter as the denominator. RADIATION DOSE REDUCTION: This exam was performed according to the departmental dose-optimization program which includes automated exposure control, adjustment of the mA and/or kV according to patient size and/or use of iterative reconstruction technique. CONTRAST:  OMNIPAQUE IOHEXOL 350 MG/ML SOLN COMPARISON:  Same day brain MRI, CTA head / neck 10/31/22 FINDINGS: CT HEAD FINDINGS Brain: No evidence of  acute infarction, hemorrhage, hydrocephalus, extra-axial collection or mass lesion/mass effect. Sequela of moderate chronic microvascular ischemic change. Vascular: No hyperdense vessel or unexpected calcification. Skull: Normal. Negative for fracture or focal lesion. Sinuses/Orbits: No middle ear or mastoid effusion. Paranasal sinuses are clear. Bilateral lens replacement. Orbits are otherwise unremarkable. Other: None. Review of the MIP images confirms the above findings CTA NECK FINDINGS Aortic arch: Standard branching. Imaged portion shows no evidence of aneurysm or dissection. No significant stenosis of the  major arch vessel origins. Right carotid system: No evidence of dissection, stenosis (50% or greater), or occlusion. Left carotid system: No evidence of dissection, stenosis (50% or greater), or occlusion. Vertebral arteries: Codominant. No evidence of dissection, stenosis (50% or greater), or occlusion. Skeleton: Negative. Other neck: Negative. Upper chest: Redemonstrated are peripheral nodular opacities in the right upper lobe, similar to 10/31/2022. Recommend further evaluation with a chest CT, if not previously performed. Review of the MIP images confirms the above findings CTA HEAD FINDINGS Anterior circulation: No significant stenosis, proximal occlusion, aneurysm, or vascular malformation. Posterior circulation: No significant stenosis, proximal occlusion, aneurysm, or vascular malformation. Venous sinuses: As permitted by contrast timing, patent. Anatomic variants: None Review of the MIP images confirms the above findings IMPRESSION: 1. No acute intracranial abnormality. Sequela of moderate chronic microvascular ischemic change. 2. No intracranial large vessel occlusion or significant stenosis. 3. No hemodynamically significant stenosis in the neck. 4. Redemonstrated are peripheral nodular opacities in the right upper lobe, similar to 10/31/2022. Recommend further evaluation with a chest CT, if not previously performed. Electronically Signed   By: Lorenza Cambridge M.D.   On: 12/03/2022 17:57   MR Brain Wo Contrast (neuro protocol)  Result Date: 12/03/2022 CLINICAL DATA:  Neuro deficit, stroke suspected, slurred speech and expressive aphasia EXAM: MRI HEAD WITHOUT CONTRAST TECHNIQUE: Multiplanar, multiecho pulse sequences of the brain and surrounding structures were obtained without intravenous contrast. COMPARISON:  10/31/2022 FINDINGS: Brain: No restricted diffusion to suggest acute or subacute infarct. Previously noted punctate foci of restricted diffusion are no longer seen. No acute hemorrhage, mass,  mass effect, or midline shift. No hydrocephalus or extra-axial collection. Scattered T2 hyperintense signal in the periventricular white matter, likely the sequela of chronic small vessel ischemic disease. Diffuse dilated perivascular spaces. Vascular: Normal arterial flow voids. Skull and upper cervical spine: Normal marrow signal. Sinuses/Orbits: Clear paranasal sinuses. No acute finding in the orbits. Status post bilateral lens replacements. Other: The mastoid air cells are well aerated. IMPRESSION: No acute intracranial process. No evidence of acute or subacute infarct. Electronically Signed   By: Wiliam Ke M.D.   On: 12/03/2022 17:09    PHYSICAL EXAM  Temp:  [97.5 F (36.4 C)-98.8 F (37.1 C)] 98.8 F (37.1 C) (04/24 1152) Pulse Rate:  [59-91] 65 (04/24 1152) Resp:  [10-20] 20 (04/24 1152) BP: (104-133)/(65-82) 127/77 (04/24 1152) SpO2:  [98 %-100 %] 100 % (04/24 1152) Weight:  [54.4 kg] 54.4 kg (04/23 2237)  General - Well nourished, well developed, in no apparent distress. Cardiovascular - Regular rhythm and rate.  Mental Status -she is very anxious. Level of arousal and orientation to time, place, and person were intact.  No aphasia or dysarthria.  On memory recall 1 out of 3, able to name about 5-6 animals with 4 legs, was able to do addition  Attention span and concentration were normal. Recent and remote memory were intact. Fund of Knowledge was assessed and was intact.  Cranial Nerves II - XII - II - Visual field  intact OU. III, IV, VI - Extraocular movements intact. V - Facial sensation intact bilaterally. VII - Facial movement intact bilaterally. VIII - Hearing & vestibular intact bilaterally. X - Palate elevates symmetrically. XI - Chin turning & shoulder shrug intact bilaterally. XII - Tongue protrusion intact.  Motor Strength - The patient's strength was normal in all extremities and pronator drift was absent.  Bulk was normal and fasciculations were absent.    Motor Tone - Muscle tone was assessed at the neck and appendages and was normal.  Sensory - Light touch, temperature/pinprick were assessed and were symmetrical.    Coordination - The patient had normal movements in the hands and feet with no ataxia or dysmetria.  Tremor was absent.  Gait and Station - deferred.  ASSESSMENT/PLAN [name] is a 72 y.o. female with history of hyperlipidemia, migraine headaches, osteoarthritis, osteoporosis, intermittent neck pain, anxiety, and recurrent episodes of transient expressive aphasia with 2 prior punctate strokes on MRI (10/12/2022 and 10/31/2022).   Transient aphasia versus TIA -unusual pattern of 3 stereotypical episodes of transient less than 5-minute episodes of expressive aphasia without neuroimaging correlate.  MRIs in the past have shown tiny punctate infarcts not in a location to explain her aphasia and MRI current admission is negative for acute stroke.  Doubt primary progressive aphasia due to lack of significant cognitive worsening: Focal seizures.  Patient refused long-term EEG monitoring CT head No acute abnormality.    CTA head & neck no LVO MRI no acute process TCD with bubble 3/24 negative 2D Echo EF 55 to 60% 3/24 30-day Holter monitor completed results pending [place] LE negative for DVTs 3/24 LDL 74 HgbA1c 5.8 VTE prophylaxis -Lovenox    Diet   Diet Heart Room service appropriate? Yes; Fluid consistency: Thin   Aspirin 81 mg prior to admission, now on aspirin 81 mg and Brilinta timing to 4 weeks and then Plavix alone.  Therapy recommendations: Pending Disposition: Pending  Hypertension Home meds: None Stable Permissive hypertension (OK if < 220/120) but gradually normalize in 5-7 days Long-term BP goal normotensive  Hyperlipidemia Home meds: Crestor 10 mg, resumed in hospital LDL 74, goal < 70 Continue statin at discharge   Other Stroke Risk Factors Advanced Age >/= 34  Hx stroke/TIA   Hospital day #  0  Gevena Mart DNP, ACNPC-AG  [facility]  STROKE MD NOTE :  I have personally obtained history,examined this patient, reviewed notes, independently viewed imaging studies, participated in medical decision making and plan of care.ROS completed by me personally and pertinent positives fully documented  I have made any additions or clarifications directly to the above note. Agree with note above.  Patient has presented with recurrent stereotypical transient episodes of expressive aphasia lasting less than 5 minutes.  In the past MRI scans have shown punctate tiny infarcts but not in locations to cause aphasia and current MRI is negative.  She has significant underlying anxiety which could be contributing.  Cognitive evaluation does not suggest significant primary progressive aphasia..  Recommend follow results of 30-day heart monitor for paroxysmal A-fib and if patient is willing may consider loop recorder later.  Aspirin and Brilinta for 4 weeks followed by Plavix alone.  Aggressive risk factor modification.  Long discussion with patient and answered questions.  Discussed with Dr.Gonfa.  Greater than 50% time during this 50-minute visit was spent on counseling and coordination of care about recurrent episodes of aphasia previous MRI showing cryptogenic strokes and discussion about evaluation and treatment and  answering questions.  Delia Heady, MD Medical Director [facility] Pager: [phone] 12/04/2022 3:53 PM   To contact Stroke Continuity provider, please refer to [email or web address]. After hours, contact General Neurology

## 2022-12-04 NOTE — Assessment & Plan Note (Addendum)
Hx of recurrent CVA -Hx of punctate acute left temporoparietal infarct on 10/12/2022 -placed on aspirin -Hx acute right frontal lobe punctate infarct - 10/31/22 - completed 3 week of DAPT with plavix. Now on monotherapy with aspirin - MRI brain today was negative for acute stroke -CTA head and neck without LVO.  -continue aspirin -I have discussed with on-call neurology Dr. Iver Nestle as to whether she again needs full TIA workup given her complex case and recent extensive workup. She will evaluate pt and review chart and will place further recommendation. Possibly will need LTM EEG rather than spot EEG.

## 2022-12-06 ENCOUNTER — Other Ambulatory Visit (HOSPITAL_COMMUNITY): Payer: Self-pay

## 2022-12-06 ENCOUNTER — Encounter: Payer: Self-pay | Admitting: Internal Medicine

## 2022-12-11 DIAGNOSIS — Z87448 Personal history of other diseases of urinary system: Secondary | ICD-10-CM | POA: Diagnosis not present

## 2022-12-11 DIAGNOSIS — Z6820 Body mass index (BMI) 20.0-20.9, adult: Secondary | ICD-10-CM | POA: Diagnosis not present

## 2022-12-11 DIAGNOSIS — Z7902 Long term (current) use of antithrombotics/antiplatelets: Secondary | ICD-10-CM | POA: Diagnosis not present

## 2022-12-11 DIAGNOSIS — Z8673 Personal history of transient ischemic attack (TIA), and cerebral infarction without residual deficits: Secondary | ICD-10-CM | POA: Diagnosis not present

## 2022-12-11 DIAGNOSIS — R4701 Aphasia: Secondary | ICD-10-CM | POA: Diagnosis not present

## 2022-12-12 DIAGNOSIS — Z1231 Encounter for screening mammogram for malignant neoplasm of breast: Secondary | ICD-10-CM | POA: Diagnosis not present

## 2022-12-16 ENCOUNTER — Other Ambulatory Visit: Payer: Self-pay | Admitting: Obstetrics and Gynecology

## 2022-12-16 DIAGNOSIS — R928 Other abnormal and inconclusive findings on diagnostic imaging of breast: Secondary | ICD-10-CM

## 2022-12-23 ENCOUNTER — Ambulatory Visit
Admission: RE | Admit: 2022-12-23 | Discharge: 2022-12-23 | Disposition: A | Discharge status: Home / Self Care | Payer: Medicare Other | Source: Ambulatory Visit | Attending: Obstetrics and Gynecology | Admitting: Obstetrics and Gynecology

## 2022-12-23 ENCOUNTER — Ambulatory Visit: Payer: Medicare Other

## 2022-12-23 DIAGNOSIS — R922 Inconclusive mammogram: Secondary | ICD-10-CM | POA: Diagnosis not present

## 2022-12-23 DIAGNOSIS — R928 Other abnormal and inconclusive findings on diagnostic imaging of breast: Secondary | ICD-10-CM

## 2022-12-24 ENCOUNTER — Telehealth: Payer: Self-pay | Admitting: Neurology

## 2022-12-24 ENCOUNTER — Encounter: Payer: Self-pay | Admitting: Neurology

## 2022-12-24 ENCOUNTER — Encounter: Payer: Self-pay | Admitting: Internal Medicine

## 2022-12-24 ENCOUNTER — Ambulatory Visit (INDEPENDENT_AMBULATORY_CARE_PROVIDER_SITE_OTHER): Payer: Medicare Other | Admitting: Neurology

## 2022-12-24 VITALS — BP 115/73 | HR 89 | Ht 65.0 in | Wt 123.0 lb

## 2022-12-24 DIAGNOSIS — G3184 Mild cognitive impairment, so stated: Secondary | ICD-10-CM

## 2022-12-24 MED ORDER — NURTEC 75 MG PO TBDP: 1.0000 | ORAL_TABLET | ORAL | 3 refills | Status: DC

## 2022-12-24 NOTE — Telephone Encounter (Signed)
PA completed on [facility]MM[facility] ZOX:[id number] Will await determination

## 2022-12-24 NOTE — Progress Notes (Unsigned)
[facility] [address] [place]. [place] [address] [phone]       OFFICE FOLLOW-UP NOTE  Tina Murray Date of Birth:  11-17-1950 Medical Record Number:  [id number]   HPI: Tina Murray is a 72 year old pleasant Caucasian lady seen today for initial office follow-up visit following recent hospital consultation for TIAs.  History is obtained from patient and review of electronic medical record I personally reviewed pertinent available imaging films in PACS.  She has past medical history of hyperlipidemia, migraines, generalized anxiety disorder, osteoporosis and degenerative disc disease.  Patient developed 2 separate episodes of speaking gibberish speech lasting 3 to 4 minutes 3 weeks apart in March 2024.  Initial MRI scan on 10/18/2022 showed small punctate left temporoparietal white matter infarct.  Repeat MRI on 10/31/2022 following the recurrent episodes showed 2 tiny punctate right medial frontal embolic strokes next to each other.  She had a third episode in April 2024 at that time MRI of the brain on 11/2322 did not show any stroke.  She had a 30-day heart monitor which did not show any paroxysmal arrhythmias.  Patient had a EKG was normal but she refused prolonged EEG monitoring.  She also refused loop recorder.  2D echo showed normal ejection fraction on 11/03/2022 without cardiac source of embolism.  Lower extremity venous Dopplers were negative.  LDL cholesterol 74 mg percent.  Hemoglobin A1c was 5.8.  She had previously been started on aspirin and Plavix for 3 weeks and was on aspirin and she returned and this time she was switched to aspirin and Brilinta for 4 weeks to be followed by Plavix alone.  She was evaluated by speech therapy for cognitive eval and did not have any significant speech or language deficits to worry about degenerative disorder like primary progressive aphasia.  She states she has no further episodes of speech disturbance or any other kind of TIA  since April.  She complains of mild short-term memory difficulties that she had had for a year but did admit to underlying anxiety and stress but was independent in activities of daily living.  ROS:   14 system review of systems is positive for speech difficulties, anxiety, memory difficulties, word finding difficulties and all other systems negative  PMH:  Past Medical History:  Diagnosis Date   DDD (degenerative disc disease), lumbar    GAD (generalized anxiety disorder)    Headache    Hypercholesterolemia    Hyperglycemia    Hyperlipemia    Lumbar radiculopathy    Migraine    Osteoarthritis    Osteoporosis    Paresthesia of foot    right   Sacroiliac joint pain    Sciatica of right side    Scoliosis    Seasonal allergies    Stroke [facility])     Social History:  Social History   Socioeconomic History   Marital status: Married    Spouse name: Not on file   Number of children: 0   Years of education: 14   Highest education level: Associate degree: occupational, Scientist, product/process development, or vocational program  Occupational History   Occupation: Retired  Tobacco Use   Smoking status: Former    Packs/day: 1.00    Years: 2.00    Additional pack years: 0.00    Total pack years: 2.00    Types: Cigarettes    Quit date: 03/03/1979    Years since quitting: 43.8    Passive exposure: Past   Smokeless tobacco: Never  Vaping Use  Vaping Use: Never used  Substance and Sexual Activity   Alcohol use: Yes    Comment: social   Drug use: Never   Sexual activity: Not on file  Other Topics Concern   Not on file  Social History Narrative   Lives at home with husband.   Right-handed.   One cup caffeine daily.   Social Determinants of Health   Financial Resource Strain: Not on file  Food Insecurity: No Food Insecurity (12/03/2022)   Hunger Vital Sign    Worried About Running Out of Food in the Last Year: Never true    Ran Out of Food in the Last Year: Never true  Transportation Needs: No  Transportation Needs (12/03/2022)   PRAPARE - Administrator, Civil Service (Medical): No    Lack of Transportation (Non-Medical): No  Physical Activity: Not on file  Stress: Not on file  Social Connections: Not on file  Intimate Partner Violence: Not At Risk (12/03/2022)   Humiliation, Afraid, Rape, and Kick questionnaire    Fear of Current or Ex-Partner: No    Emotionally Abused: No    Physically Abused: No    Sexually Abused: No    Medications:   Current Outpatient Medications on File Prior to Visit  Medication Sig Dispense Refill   aspirin EC 81 MG tablet Take 1 tablet (81 mg total) by mouth daily for 28 days. Swallow whole. 28 tablet 0   CALCIUM PO Take 1 tablet by mouth daily.     OVER THE COUNTER MEDICATION Take 1 tablet by mouth 2 (two) times daily. Viviscal hair growth supplements     Rosuvastatin Calcium 10 MG CPSP Take 10 mg by mouth every evening.     ticagrelor (BRILINTA) 90 MG TABS tablet Take 1 tablet (90 mg total) by mouth 2 (two) times daily. 60 tablet 0   [START ON 01/01/2023] clopidogrel (PLAVIX) 75 MG tablet Take 1 tablet (75 mg total) by mouth daily. Start this after 4 weeks once you are done with Brilinta and aspirin. (Patient not taking: Reported on 12/24/2022) 90 tablet 3   fexofenadine (ALLEGRA ALLERGY) 180 MG tablet Take 180 mg by mouth daily.     No current facility-administered medications on file prior to visit.    Allergies:  No Known Allergies  Physical Exam General: well developed, well nourished pleasant elderly Caucasian lady, seated, in no evident distress but appears anxious Head: head normocephalic and atraumatic.  Neck: supple with no carotid or supraclavicular bruits Cardiovascular: regular rate and rhythm, no murmurs Musculoskeletal: no deformity Skin:  no rash/petichiae Vascular:  Normal pulses all extremities Vitals:   12/24/22 1004  BP: 115/73  Pulse: 89   Neurologic Exam Mental Status: Awake and fully alert. Oriented to  place and time. Recent and remote memory intact. Attention span, concentration and fund of knowledge appropriate. Mood and affect appropriate.  Mini-Mental status exam scored 28/30.  Able to name only 4 animals which can walk on 4 legs.  Clock drawing 3/4.  Geriatric depression scale score is 0 not depressed. Cranial Nerves: Fundoscopic exam reveals sharp disc margins. Pupils equal, briskly reactive to light. Extraocular movements full without nystagmus. Visual fields full to confrontation. Hearing intact. Facial sensation intact. Face, tongue, palate moves normally and symmetrically.  Motor: Normal bulk and tone. Normal strength in all tested extremity muscles. Sensory.: intact to touch ,pinprick .position and vibratory sensation.  Coordination: Rapid alternating movements normal in all extremities. Finger-to-nose and heel-to-shin performed accurately bilaterally. Gait and Station:  Arises from chair without difficulty. Stance is normal. Gait demonstrates normal stride length and balance . Able to heel, toe and tandem walk with moderate difficulty.  Reflexes: 1+ and symmetric. Toes downgoing.   NIHSS  0 Modified Rankin  1    12/24/2022   10:29 AM  MMSE - Mini Mental State Exam  Orientation to time 4  Orientation to Place 5  Registration 3  Attention/ Calculation 5  Recall 2  Language- name 2 objects 2  Language- repeat 1  Language- follow 3 step command 3  Language- read & follow direction 1  Write a sentence 1  Copy design 1  Total score 28     ASSESSMENT: 72 year old Caucasian lady with 3 stereotypical episodes of transient expressive aphasia lasting less than 5 minutes in March 2024 without definite neuroimaging correlate but abnormal MRI punctate left temporoparietal 10/18/2022 and 2 tiny right frontal punctate infarcts 10/31/22.  Unremarkable neurovascular imaging, cardiac monitoring and echocardiogram.  She does have significant underlying anxiety.  She is now complaining of mild  cognitive impairment which may be age-appropriate but he may be at risk for primary progressive aphasia given her episodes of transient speech difficulties..  She has longstanding history of migraines with recent increase in frequency.    PLAN:I had a long d/w patient about his recent stroke, risk for recurrent cryptogenic stroke/TIAs, personally independently reviewed imaging studies and stroke evaluation results and answered questions.Continue aspirin 81 mg daily and Brilinta (ticagrelor) 90 mg bid for 10 more days and then stop both and switch to Plavix 75 mg daily alone for secondary stroke prevention and maintain strict control of hypertension with blood pressure goal below 130/90, diabetes with hemoglobin A1c goal below 6.5% and lipids with LDL cholesterol goal below 70 mg/dL. I also advised the patient to eat a healthy diet with plenty of whole grains, cereals, fruits and vegetables, exercise regularly and maintain ideal body weight patient is refusing loop recorder for paroxysmal A-fib.Marland Kitchentrial of Nurtec every other day for migraine prophylaxis.  Follow-up with Dr. Terrace Arabia for migraines followup in the future with me in 6 months for stroke.  Greater than 50% of time during this 45 minute visit was spent on counseling,explanation of diagnosis of TIAs, aphasia, migraines, planning of further management, discussion with patient and family and coordination of care Delia Heady, MD Note: This document was prepared with digital dictation and possible smart phrase technology. Any transcriptional errors that result from this process are unintentional

## 2022-12-24 NOTE — Patient Instructions (Signed)
I had a long d/w patient about his recent stroke, risk for recurrent cryptogenic stroke/TIAs, personally independently reviewed imaging studies and stroke evaluation results and answered questions.Continue aspirin 81 mg daily and Brilinta (ticagrelor) 90 mg bid for 10 more days and then stop both and switch to Plavix 75 mg daily alone for secondary stroke prevention and maintain strict control of hypertension with blood pressure goal below 130/90, diabetes with hemoglobin A1c goal below 6.5% and lipids with LDL cholesterol goal below 70 mg/dL. I also advised the patient to eat a healthy diet with plenty of whole grains, cereals, fruits and vegetables, exercise regularly and maintain ideal body weight patient is refusing loop recorder for paroxysmal A-fib.Marland Kitchentrial of Nurtec every other day for migraine prophylaxis.  Follow-up with Dr. Terrace Arabia for migraines followup in the future with me in 6 months for stroke.  Stroke Prevention Some medical conditions and behaviors can lead to a higher chance of having a stroke. You can help prevent a stroke by eating healthy, exercising, not smoking, and managing any medical conditions you have. Stroke is a leading cause of functional impairment. Primary prevention is particularly important because a majority of strokes are first-time events. Stroke changes the lives of not only those who experience a stroke but also their family and other caregivers. How can this condition affect me? A stroke is a medical emergency and should be treated right away. A stroke can lead to brain damage and can sometimes be life-threatening. If a person gets medical treatment right away, there is a better chance of surviving and recovering from a stroke. What can increase my risk? The following medical conditions may increase your risk of a stroke: Cardiovascular disease. High blood pressure (hypertension). Diabetes. High cholesterol. Sickle cell disease. Blood clotting disorders  (hypercoagulable state). Obesity. Sleep disorders (obstructive sleep apnea). Other risk factors include: Being older than age 37. Having a history of blood clots, stroke, or mini-stroke (transient ischemic attack, TIA). Genetic factors, such as race, ethnicity, or a family history of stroke. Smoking cigarettes or using other tobacco products. Taking birth control pills, especially if you also use tobacco. Heavy use of alcohol or drugs, especially cocaine and methamphetamine. Physical inactivity. What actions can I take to prevent this? Manage your health conditions High cholesterol levels. Eating a healthy diet is important for preventing high cholesterol. If cholesterol cannot be managed through diet alone, you may need to take medicines. Take any prescribed medicines to control your cholesterol as told by your health care provider. Hypertension. To reduce your risk of stroke, try to keep your blood pressure below 130/80. Eating a healthy diet and exercising regularly are important for controlling blood pressure. If these steps are not enough to manage your blood pressure, you may need to take medicines. Take any prescribed medicines to control hypertension as told by your health care provider. Ask your health care provider if you should monitor your blood pressure at home. Have your blood pressure checked every year, even if your blood pressure is normal. Blood pressure increases with age and some medical conditions. Diabetes. Eating a healthy diet and exercising regularly are important parts of managing your blood sugar (glucose). If your blood sugar cannot be managed through diet and exercise, you may need to take medicines. Take any prescribed medicines to control your diabetes as told by your health care provider. Get evaluated for obstructive sleep apnea. Talk to your health care provider about getting a sleep evaluation if you snore a lot or have excessive sleepiness.  Make sure that  any other medical conditions you have, such as atrial fibrillation or atherosclerosis, are managed. Nutrition Follow instructions from your health care provider about what to eat or drink to help manage your health condition. These instructions may include: Reducing your daily calorie intake. Limiting how much salt (sodium) you use to 1,500 milligrams (mg) each day. Using only healthy fats for cooking, such as olive oil, canola oil, or sunflower oil. Eating healthy foods. You can do this by: Choosing foods that are high in fiber, such as whole grains, and fresh fruits and vegetables. Eating at least 5 servings of fruits and vegetables a day. Try to fill one-half of your plate with fruits and vegetables at each meal. Choosing lean protein foods, such as lean cuts of meat, poultry without skin, fish, tofu, beans, and nuts. Eating low-fat dairy products. Avoiding foods that are high in sodium. This can help lower blood pressure. Avoiding foods that have saturated fat, trans fat, and cholesterol. This can help prevent high cholesterol. Avoiding processed and prepared foods. Counting your daily carbohydrate intake.  Lifestyle If you drink alcohol: Limit how much you have to: 0-1 drink a day for women who are not pregnant. 0-2 drinks a day for men. Know how much alcohol is in your drink. In the [place] one drink equals one 12 oz bottle of beer ( ), one 5 oz glass of wine ( ), or one 1 oz glass of hard liquor (44mL). Do not use any products that contain nicotine or tobacco. These products include cigarettes, chewing tobacco, and vaping devices, such as e-cigarettes. If you need help quitting, ask your health care provider. Avoid secondhand smoke. Do not use drugs. Activity  Try to stay at a healthy weight. Get at least 30 minutes of exercise on most days, such as: Fast walking. Biking. Swimming. Medicines Take over-the-counter and prescription medicines only as told by your health  care provider. Aspirin or blood thinners (antiplatelets or anticoagulants) may be recommended to reduce your risk of forming blood clots that can lead to stroke. Avoid taking birth control pills. Talk to your health care provider about the risks of taking birth control pills if: You are over 11 years old. You smoke. You get very bad headaches. You have had a blood clot. Where to find more information American Stroke Association: www.strokeassociation.org Get help right away if: You or a loved one has any symptoms of a stroke. "BE FAST" is an easy way to remember the main warning signs of a stroke: B - Balance. Signs are dizziness, sudden trouble walking, or loss of balance. E - Eyes. Signs are trouble seeing or a sudden change in vision. F - Face. Signs are sudden weakness or numbness of the face, or the face or eyelid drooping on one side. A - Arms. Signs are weakness or numbness in an arm. This happens suddenly and usually on one side of the body. S - Speech. Signs are sudden trouble speaking, slurred speech, or trouble understanding what people say. T - Time. Time to call emergency services. Write down what time symptoms started. You or a loved one has other signs of a stroke, such as: A sudden, severe headache with no known cause. Nausea or vomiting. Seizure. These symptoms may represent a serious problem that is an emergency. Do not wait to see if the symptoms will go away. Get medical help right away. Call your local emergency services (911 in the [place] Do not drive yourself to the hospital. Summary You  can help to prevent a stroke by eating healthy, exercising, not smoking, limiting alcohol intake, and managing any medical conditions you may have. Do not use any products that contain nicotine or tobacco. These include cigarettes, chewing tobacco, and vaping devices, such as e-cigarettes. If you need help quitting, ask your health care provider. Remember "BE FAST" for warning signs of  a stroke. Get help right away if you or a loved one has any of these signs. This information is not intended to replace advice given to you by your health care provider. Make sure you discuss any questions you have with your health care provider. Document Revised: 02/10/2020 Document Reviewed: 02/28/2020 Elsevier Patient Education  2023 [employer].

## 2022-12-24 NOTE — Telephone Encounter (Signed)
PA approved for the patient until 12/10/2022

## 2022-12-24 NOTE — Telephone Encounter (Signed)
Pt has just been informed the cost of the  Rimegepant Sulfate (NURTEC) 75 MG TBDP ,would be over $3,300.00 pt is asking for a call to discuss options.

## 2022-12-24 NOTE — Telephone Encounter (Signed)
We had told the patient in the office we would have to complete a prior authorization first. We will do this and see if the medication becomes more affordable for the pt.

## 2022-12-25 NOTE — Telephone Encounter (Signed)
Called pt. Informed her of message that nurse [facility] sent. Pt said thanks for the information

## 2022-12-25 NOTE — Telephone Encounter (Signed)
Pt called back. Stated Nurtec will be $942.72 with PA. Stated she can't afford this medication and wants to know if something cheaper can be prescribed.

## 2022-12-26 ENCOUNTER — Encounter: Payer: Self-pay | Admitting: Neurology

## 2022-12-30 NOTE — Telephone Encounter (Signed)
Pt called to inquire about starting Botox since the cost of suggested medication is very high.  Pt is concerned that one of the suggested medications could cause a stroke.  Pt said she was not familiar with topiramate, pt was encouraged to respond to my chart message from [place], [place],  Pt will look into topiramate,  she was not crazy about going back on amitriptyline .   This is not a request for a call back, she will my chart RN

## 2022-12-31 NOTE — Telephone Encounter (Signed)
Dr. Pearlean Brownie recommended that patient follow up with Dr. Terrace Arabia regarding migraines. Per [employer], the nurtec is working well but she is interested in Botox, which would need to come from Dr. Terrace Arabia.

## 2023-01-08 ENCOUNTER — Other Ambulatory Visit (HOSPITAL_COMMUNITY): Payer: Self-pay

## 2023-01-13 NOTE — Progress Notes (Unsigned)
Cardiology Office Note:   Date:  01/13/2023  ID:  Tina Murray, DOB 03/21/1951, MRN [id number]  History of Present Illness:   Tina Murray is a 72 y.o. female with history of chronic migraines, HLD and recent CVA on 10/2022 who was referred by Dr Atha Starks for CVA.   Patient admitted in 10/12/2022 for punctate acute left temporoparietal infarct. Was then readmitted 10/31/22 with right frontal lobe punctate infarct. Was recommended for DAPT at that time. Declined loop. 30 day monitor with NSR, occasional Pacs, nonsustained Atach. No Afib.  Today, ***  Past Medical History:  Diagnosis Date   DDD (degenerative disc disease), lumbar    GAD (generalized anxiety disorder)    Headache    Hypercholesterolemia    Hyperglycemia    Hyperlipemia    Lumbar radiculopathy    Migraine    Osteoarthritis    Osteoporosis    Paresthesia of foot    right   Sacroiliac joint pain    Sciatica of right side    Scoliosis    Seasonal allergies    Stroke (HCC)      ROS: ***  Studies Reviewed:    EKG:  ***  Cardiac Studies & Procedures       ECHOCARDIOGRAM  ECHOCARDIOGRAM COMPLETE 10/15/2022  Narrative ECHOCARDIOGRAM REPORT    Patient Name:   Tina Murray Date of Exam: 10/15/2022 Medical Rec #:  [id number]     Height:       65.0 in Accession #:    [id number]    Weight:       125.0 lb Date of Birth:  [birth year]    BSA:          1.620 m Patient Age:    71 years      BP:           120/70 mmHg Patient Gender: F             HR:           86 bpm. Exam Location:  Outpatient  Procedure: 2D Echo, 3D Echo, Color Doppler, Cardiac Doppler and Strain Analysis  Indications:    TIA  History:        Patient has no prior history of Echocardiogram examinations. TIA; Risk Factors:Former Smoker. Chronic Migraine.  Sonographer:    Jeryl Columbia RDCS Referring Phys: [id number] Grayling Congress MADDEN  IMPRESSIONS   1. Left ventricular ejection fraction, by estimation, is 55 to 60%. The left ventricle has normal  function. The left ventricle has no regional wall motion abnormalities. Left ventricular diastolic parameters were normal. 2. Right ventricular systolic function is normal. The right ventricular size is normal. 3. The mitral valve is normal in structure. Mild mitral valve regurgitation. No evidence of mitral stenosis. 4. The aortic valve is tricuspid. Aortic valve regurgitation is trivial. Aortic valve sclerosis/calcification is present, without any evidence of aortic stenosis. 5. The inferior vena cava is normal in size with greater than 50% respiratory variability, suggesting right atrial pressure of 3 mmHg.  Comparison(s): No prior Echocardiogram.  FINDINGS Left Ventricle: Left ventricular ejection fraction, by estimation, is 55 to 60%. The left ventricle has normal function. The left ventricle has no regional wall motion abnormalities. The left ventricular internal cavity size was normal in size. There is no left ventricular hypertrophy. Left ventricular diastolic parameters were normal.  Right Ventricle: The right ventricular size is normal. Right ventricular systolic function is normal.  Left Atrium: Left atrial size was normal in size.  Right Atrium: Right atrial  size was normal in size.  Pericardium: There is no evidence of pericardial effusion.  Mitral Valve: The mitral valve is normal in structure. There is mild thickening of the mitral valve leaflet(s). Mild mitral valve regurgitation. No evidence of mitral valve stenosis.  Tricuspid Valve: The tricuspid valve is normal in structure. Tricuspid valve regurgitation is trivial. No evidence of tricuspid stenosis.  Aortic Valve: The aortic valve is tricuspid. Aortic valve regurgitation is trivial. Aortic valve sclerosis/calcification is present, without any evidence of aortic stenosis.  Pulmonic Valve: The pulmonic valve was normal in structure. Pulmonic valve regurgitation is trivial. No evidence of pulmonic stenosis.  Aorta: The  aortic root is normal in size and structure.  Venous: The inferior vena cava is normal in size with greater than 50% respiratory variability, suggesting right atrial pressure of 3 mmHg.  IAS/Shunts: The interatrial septum is aneurysmal. No atrial level shunt detected by color flow Doppler.   LEFT VENTRICLE PLAX 2D LVIDd:         3.46 cm   Diastology LVIDs:         2.36 cm   LV e' medial:    8.49 cm/s LV PW:         0.99 cm   LV E/e' medial:  8.3 LV IVS:        0.76 cm   LV e' lateral:   15.10 cm/s LVOT diam:     2.00 cm   LV E/e' lateral: 4.7 LV SV:         53 LV SV Index:   33 LVOT Area:     3.14 cm   RIGHT VENTRICLE RV Basal diam:  3.97 cm RV Mid diam:    2.85 cm RV S prime:     14.30 cm/s TAPSE (M-mode): 2.3 cm  LEFT ATRIUM             Index        RIGHT ATRIUM           Index LA diam:        2.90 cm 1.79 cm/m   RA Area:     11.10 cm LA Vol (A2C):   26.3 ml 16.23 ml/m  RA Volume:   25.60 ml  15.80 ml/m LA Vol (A4C):   36.7 ml 22.65 ml/m LA Biplane Vol: 33.4 ml 20.62 ml/m AORTIC VALVE LVOT Vmax:   86.50 cm/s LVOT Vmean:  55.000 cm/s LVOT VTI:    0.169 m  AORTA Ao Root diam: 2.80 cm Ao Asc diam:  2.80 cm  MITRAL VALVE               TRICUSPID VALVE MV Area (PHT): 5.13 cm    TR Peak grad:   18.8 mmHg MV Decel Time: 148 msec    TR Vmax:        217.00 cm/s MR Peak grad: 106.1 mmHg MR Mean grad: 84.0 mmHg    SHUNTS MR Vmax:      515.00 cm/s  Systemic VTI:  0.17 m MR Vmean:     452.0 cm/s   Systemic Diam: 2.00 cm MV E velocity: 70.70 cm/s MV A velocity: 75.50 cm/s MV E/A ratio:  0.94  Olga Millers MD Electronically signed by Olga Millers MD Signature Date/Time: 10/15/2022/11:39:23 AM    Final    MONITORS  CARDIAC EVENT MONITOR 12/05/2022  Narrative   Predominant rhythm was normal sinus rhythm with average heart rate78 bpm and ranged from 55 to143 bpm.   Occasional PACs  Nonsustained atrial tachycarda            Risk Assessment/Calculations:    {Does this patient have ATRIAL FIBRILLATION?:[id number]} No BP recorded.  {Refresh Note OR Click here to enter BP  :1}***        Physical Exam:   VS:  There were no vitals taken for this visit.   Wt Readings from Last 3 Encounters:  12/24/22 123 lb (55.8 kg)  12/03/22 120 lb (54.4 kg)  11/20/22 125 lb (56.7 kg)     GEN: Well nourished, well developed in no acute distress NECK: No JVD; No carotid bruits CARDIAC: ***RRR, no murmurs, rubs, gallops RESPIRATORY:  Clear to auscultation without rales, wheezing or rhonchi  ABDOMEN: Soft, non-tender, non-distended EXTREMITIES:  No edema; No deformity   ASSESSMENT AND PLAN:   #CVA: -Patient with multiple episodes of slurred speech prompting -Initial Initial MRI scan on 10/18/2022 showed small punctate left temporoparietal white matter infarct -Repeat MRI on 10/31/2022 following recurrent episode showed 2 tiny punctate right medial frontal embolic strokes next to each other -Had another episode in 11/2022 where MRI showed no new lesions -TTE with normal EF, no significant valve disease -30day monitor with no Afib or sustained arrhythmias -Declined loop -Continue plavix 75mg  daily -Continue crestor 10mg  daily      {Are you ordering a CV Procedure (e.g. stress test, cath, DCCV, TEE, etc)?   Press F2        :[id number]}   Signed, Meriam Sprague, MD

## 2023-01-23 ENCOUNTER — Encounter: Payer: Self-pay | Admitting: Cardiology

## 2023-01-23 ENCOUNTER — Ambulatory Visit: Payer: Medicare Other | Attending: Cardiology | Admitting: Cardiology

## 2023-01-23 VITALS — BP 110/64 | HR 76 | Ht 65.0 in | Wt 123.4 lb

## 2023-01-23 DIAGNOSIS — I639 Cerebral infarction, unspecified: Secondary | ICD-10-CM | POA: Diagnosis not present

## 2023-01-23 NOTE — Patient Instructions (Signed)
Medication Instructions:  Your physician recommends that you continue on your current medications as directed. Please refer to the Current Medication list given to you today.  *If you need a refill on your cardiac medications before your next appointment, please call your pharmacy*    Follow-Up: At [facility], you and your health needs are our priority.  As part of our continuing mission to provide you with exceptional heart care, we have created designated Provider Care Teams.  These Care Teams include your primary Cardiologist (physician) and Advanced Practice Providers (APPs -  Physician Assistants and Nurse Practitioners) who all work together to provide you with the care you need, when you need it.  We recommend signing up for the patient portal called "MyChart".  Sign up information is provided on this After Visit Summary.  MyChart is used to connect with patients for Virtual Visits (Telemedicine).  Patients are able to view lab/test results, encounter notes, upcoming appointments, etc.  Non-urgent messages can be sent to your provider as well.   To learn more about what you can do with MyChart, go to [email or web address].    Your next appointment:   6 month(s)  Provider:   Dr. Cristal Deer

## 2023-01-27 DIAGNOSIS — J189 Pneumonia, unspecified organism: Secondary | ICD-10-CM | POA: Diagnosis not present

## 2023-01-27 DIAGNOSIS — R0602 Shortness of breath: Secondary | ICD-10-CM | POA: Diagnosis not present

## 2023-01-27 DIAGNOSIS — R051 Acute cough: Secondary | ICD-10-CM | POA: Diagnosis not present

## 2023-02-05 DIAGNOSIS — Z23 Encounter for immunization: Secondary | ICD-10-CM | POA: Diagnosis not present

## 2023-02-11 NOTE — Telephone Encounter (Signed)
I returned pt's call. Pt wanted to get started with Botox. Pt states the Nurtec did not work well for her and would like to give Botox a try to see if this will help with her Migraines. I told pt we will start the PA for Botox and once we hear from her insurance and coverage we will give her a call to get her scheduled.   Botox [id number]  ICD G43.709 CPT [phone]

## 2023-02-11 NOTE — Telephone Encounter (Signed)
Pt transferred care from Dr. Terrace Arabia to Dr. Pearlean Brownie. I'm not seeing any previous OV notes that discussed Botox.   Aundra Millet, would you be ok with seeing pt to discuss starting Botox, or should she f/u with one of the MDs at first available?

## 2023-02-11 NOTE — Telephone Encounter (Signed)
Pt stated she would like to consider Botox. Pt requesting a call back from nurse.

## 2023-02-11 NOTE — Telephone Encounter (Signed)
Called pt and informed her that we would need an updated visit with one of the NPs to discuss Botox. She accepted an appt with Amy on 02/25/23 at 8:00 am.

## 2023-02-11 NOTE — Telephone Encounter (Signed)
Any of the NPs are fine

## 2023-02-12 NOTE — Telephone Encounter (Signed)
Patient called in asking for samples of Nurtec ODT 75mg . States she has a migraine and needs something to help her until she can get in with Amy to see her for possible Botox

## 2023-02-14 ENCOUNTER — Telehealth: Payer: Self-pay | Admitting: Emergency Medicine

## 2023-02-14 NOTE — Telephone Encounter (Signed)
Spoke with patient. States she has been getting over pneumonia and has finished an antibiotic, she is still complaining of dry cough and irritation in her neck.  She doesnt complain of any other symptoms. She hasn't tried to take anything over the counter, wasn't sure what to take.  Dr. Val Eagle can you please advise since Dr. Delton Coombes is off [facility] on [address]

## 2023-02-14 NOTE — Telephone Encounter (Signed)
Over-the-counter Mucinex or Delsym if she is having a lot of coughing  Coughing following a respiratory infection may last a few weeks unfortunately

## 2023-02-14 NOTE — Telephone Encounter (Signed)
Spoke with the pt and notified of response per Dr. Olalere  She verbalized understanding  Nothing further needed 

## 2023-02-14 NOTE — Telephone Encounter (Signed)
Pt states she had pneumonia and has finished the antibiotics but is struggling with a cough/gagging issue.

## 2023-02-17 NOTE — Telephone Encounter (Signed)
Pt stated she is following-up on Nurtec samples, stated she has a bad headache today and need them.

## 2023-02-24 NOTE — Telephone Encounter (Signed)
Currently we are out of nurtec samples. Pt has apt tomorrow with Amy.

## 2023-02-24 NOTE — Progress Notes (Deleted)
No chief complaint on file.   HISTORY OF PRESENT ILLNESS:  02/24/23 ALL:  Tina Murray is a 72 y.o. female here today for follow up for     Medications tried and failed: amitriptyline  HISTORY (copied from Dr Marlis Edelson previous note)  HPI: Tina Murray is a 72 year old pleasant Caucasian lady seen today for initial office follow-up visit following recent hospital consultation for TIAs.  History is obtained from patient and review of electronic medical record I personally reviewed pertinent available imaging films in PACS.  She has past medical history of hyperlipidemia, migraines, generalized anxiety disorder, osteoporosis and degenerative disc disease.  Patient developed 2 separate episodes of speaking gibberish speech lasting 3 to 4 minutes 3 weeks apart in March 2024.  Initial MRI scan on 10/18/2022 showed small punctate left temporoparietal white matter infarct.  Repeat MRI on 10/31/2022 following the recurrent episodes showed 2 tiny punctate right medial frontal embolic strokes next to each other.  She had a third episode in April 2024 at that time MRI of the brain on 11/2322 did not show any stroke.  She had a 30-day heart monitor which did not show any paroxysmal arrhythmias.  Patient had a EKG was normal but she refused prolonged EEG monitoring.  She also refused loop recorder.  2D echo showed normal ejection fraction on 11/03/2022 without cardiac source of embolism.  Lower extremity venous Dopplers were negative.  LDL cholesterol 74 mg percent.  Hemoglobin A1c was 5.8.  She had previously been started on aspirin and Plavix for 3 weeks and was on aspirin and she returned and this time she was switched to aspirin and Brilinta for 4 weeks to be followed by Plavix alone.  She was evaluated by speech therapy for cognitive eval and did not have any significant speech or language deficits to worry about degenerative disorder like primary progressive aphasia.  She states she has no further episodes of  speech disturbance or any other kind of TIA since April.  She complains of mild short-term memory difficulties that she had had for a year but did admit to underlying anxiety and stress but was independent in activities of daily living.   REVIEW OF SYSTEMS: Out of a complete 14 system review of symptoms, the patient complains only of the following symptoms, headaches, anxiety, memory loss, chronic pain and all other reviewed systems are negative.   ALLERGIES: No Known Allergies   HOME MEDICATIONS: Outpatient Medications Prior to Visit  Medication Sig Dispense Refill   CALCIUM PO Take 1 tablet by mouth daily.     clopidogrel (PLAVIX) 75 MG tablet Take 1 tablet (75 mg total) by mouth daily. Start this after 4 weeks once you are done with Brilinta and aspirin. 90 tablet 3   fexofenadine (ALLEGRA ALLERGY) 180 MG tablet Take 180 mg by mouth daily.     OVER THE COUNTER MEDICATION Take 1 tablet by mouth 2 (two) times daily. Viviscal hair growth supplements     Rosuvastatin Calcium 10 MG CPSP Take 10 mg by mouth every evening.     No facility-administered medications prior to visit.     PAST MEDICAL HISTORY: Past Medical History:  Diagnosis Date   DDD (degenerative disc disease), lumbar    GAD (generalized anxiety disorder)    Headache    Hypercholesterolemia    Hyperglycemia    Hyperlipemia    Lumbar radiculopathy    Migraine    Osteoarthritis    Osteoporosis    Paresthesia of foot  right   Sacroiliac joint pain    Sciatica of right side    Scoliosis    Seasonal allergies    Stroke (HCC)      PAST SURGICAL HISTORY: Past Surgical History:  Procedure Laterality Date   HAND SURGERY Right    Right middle finger trigger release   OVARIAN CYST REMOVAL Left 1977     FAMILY HISTORY: Family History  Problem Relation Age of Onset   Stroke Mother        age 57   COPD Mother    Other Father        age 20 - complications from surgery   Hyperlipidemia Brother    Heart  attack Brother        age 42     SOCIAL HISTORY: Social History   Socioeconomic History   Marital status: Married    Spouse name: Not on file   Number of children: 0   Years of education: 14   Highest education level: Associate degree: occupational, Scientist, product/process development, or vocational program  Occupational History   Occupation: Retired  Tobacco Use   Smoking status: Former    Current packs/day: 0.00    Average packs/day: 1 pack/day for 2.0 years (2.0 ttl pk-yrs)    Types: Cigarettes    Start date: 03/02/1977    Quit date: 03/03/1979    Years since quitting: 44.0    Passive exposure: Past   Smokeless tobacco: Never  Vaping Use   Vaping status: Never Used  Substance and Sexual Activity   Alcohol use: Yes    Comment: social   Drug use: Never   Sexual activity: Not on file  Other Topics Concern   Not on file  Social History Narrative   Lives at home with husband.   Right-handed.   One cup caffeine daily.   Social Determinants of Health   Financial Resource Strain: Not on file  Food Insecurity: No Food Insecurity (12/03/2022)   Hunger Vital Sign    Worried About Running Out of Food in the Last Year: Never true    Ran Out of Food in the Last Year: Never true  Transportation Needs: No Transportation Needs (12/03/2022)   PRAPARE - Administrator, Civil Service (Medical): No    Lack of Transportation (Non-Medical): No  Physical Activity: Not on file  Stress: Not on file  Social Connections: Not on file  Intimate Partner Violence: Not At Risk (12/03/2022)   Humiliation, Afraid, Rape, and Kick questionnaire    Fear of Current or Ex-Partner: No    Emotionally Abused: No    Physically Abused: No    Sexually Abused: No     PHYSICAL EXAM  There were no vitals filed for this visit. There is no height or weight on file to calculate BMI.  Generalized: Well developed, in no acute distress  Cardiology: normal rate and rhythm, no murmur auscultated  Respiratory: clear to  auscultation bilaterally    Neurological examination  Mentation: Alert oriented to time, place, history taking. Follows all commands speech and language fluent Cranial nerve II-XII: Pupils were equal round reactive to light. Extraocular movements were full, visual field were full on confrontational test. Facial sensation and strength were normal. Uvula tongue midline. Head turning and shoulder shrug  were normal and symmetric. Motor: The motor testing reveals 5 over 5 strength of all 4 extremities. Good symmetric motor tone is noted throughout.  Sensory: Sensory testing is intact to soft touch on all 4 extremities.  No evidence of extinction is noted.  Coordination: Cerebellar testing reveals good finger-nose-finger and heel-to-shin bilaterally.  Gait and station: Gait is normal. Tandem gait is normal. Romberg is negative. No drift is seen.  Reflexes: Deep tendon reflexes are symmetric and normal bilaterally.    DIAGNOSTIC DATA (LABS, IMAGING, TESTING) - I reviewed patient records, labs, notes, testing and imaging myself where available.  Lab Results  Component Value Date   WBC 7.6 12/03/2022   HGB 13.5 12/03/2022   HCT 41.0 12/03/2022   MCV 93.4 12/03/2022   PLT 294 12/03/2022      Component Value Date/Time   NA 139 12/04/2022 0339   K 3.7 12/04/2022 0339   CL 106 12/04/2022 0339   CO2 24 12/04/2022 0339   GLUCOSE 88 12/04/2022 0339   BUN 17 12/04/2022 0339   CREATININE 0.91 12/04/2022 0339   CREATININE 0.90 08/21/2022 1148   CALCIUM 9.0 12/04/2022 0339   PROT 5.9 (L) 11/02/2022 0159   ALBUMIN 3.5 11/02/2022 0159   AST 18 11/02/2022 0159   ALT 14 11/02/2022 0159   ALKPHOS 51 11/02/2022 0159   BILITOT 0.4 11/02/2022 0159   GFRNONAA >60 12/04/2022 0339   Lab Results  Component Value Date   CHOL 146 11/01/2022   HDL 56 11/01/2022   LDLCALC 74 11/01/2022   TRIG 81 11/01/2022   CHOLHDL 2.6 11/01/2022   Lab Results  Component Value Date   HGBA1C 5.8 (H) 11/01/2022   No  results found for: "VITAMINB12" No results found for: "TSH"     12/24/2022   10:29 AM  MMSE - Mini Mental State Exam  Orientation to time 4  Orientation to Place 5  Registration 3  Attention/ Calculation 5  Recall 2  Language- name 2 objects 2  Language- repeat 1  Language- follow 3 step command 3  Language- read & follow direction 1  Write a sentence 1  Copy design 1  Total score 28         No data to display           ASSESSMENT AND PLAN  72 y.o. year old female  has a past medical history of DDD (degenerative disc disease), lumbar, GAD (generalized anxiety disorder), Headache, Hypercholesterolemia, Hyperglycemia, Hyperlipemia, Lumbar radiculopathy, Migraine, Osteoarthritis, Osteoporosis, Paresthesia of foot, Sacroiliac joint pain, Sciatica of right side, Scoliosis, Seasonal allergies, and Stroke (HCC). here with    No diagnosis found.  Tina Murray [facility] ***.  Healthy lifestyle habits encouraged. *** will follow up with PCP as directed. *** will return to see me in ***, sooner if needed. *** verbalizes understanding and agreement with this plan.   No orders of the defined types were placed in this encounter.    No orders of the defined types were placed in this encounter.    Shawnie Dapper, MSN, FNP-C 02/24/2023, 11:41 AM  [facility] [address] [place], [place] [address] [phone]

## 2023-02-24 NOTE — Patient Instructions (Signed)

## 2023-02-24 NOTE — Progress Notes (Unsigned)
No chief complaint on file.   HISTORY OF PRESENT ILLNESS:  02/24/23 ALL:  Tina Murray is a 72 y.o. female here today for follow up for migraines. She is followed by Dr Pearlean Brownie for hx of CVA with [facility]. Last seen 12/2022. Previously followed by Dr Terrace Arabia.    Medications tried and failed: amitriptyline  HISTORY (copied from Dr Marlis Edelson previous note)  HPI: Tina Murray is a 72 year old pleasant Caucasian lady seen today for initial office follow-up visit following recent hospital consultation for TIAs.  History is obtained from patient and review of electronic medical record I personally reviewed pertinent available imaging films in PACS.  She has past medical history of hyperlipidemia, migraines, generalized anxiety disorder, osteoporosis and degenerative disc disease.  Patient developed 2 separate episodes of speaking gibberish speech lasting 3 to 4 minutes 3 weeks apart in March 2024.  Initial MRI scan on 10/18/2022 showed small punctate left temporoparietal white matter infarct.  Repeat MRI on 10/31/2022 following the recurrent episodes showed 2 tiny punctate right medial frontal embolic strokes next to each other.  She had a third episode in April 2024 at that time MRI of the brain on 11/2322 did not show any stroke.  She had a 30-day heart monitor which did not show any paroxysmal arrhythmias.  Patient had a EKG was normal but she refused prolonged EEG monitoring.  She also refused loop recorder.  2D echo showed normal ejection fraction on 11/03/2022 without cardiac source of embolism.  Lower extremity venous Dopplers were negative.  LDL cholesterol 74 mg percent.  Hemoglobin A1c was 5.8.  She had previously been started on aspirin and Plavix for 3 weeks and was on aspirin and she returned and this time she was switched to aspirin and Brilinta for 4 weeks to be followed by Plavix alone.  She was evaluated by speech therapy for cognitive eval and did not have any significant speech or language deficits to  worry about degenerative disorder like primary progressive aphasia.  She states she has no further episodes of speech disturbance or any other kind of TIA since April.  She complains of mild short-term memory difficulties that she had had for a year but did admit to underlying anxiety and stress but was independent in activities of daily living.   REVIEW OF SYSTEMS: Out of a complete 14 system review of symptoms, the patient complains only of the following symptoms, headaches, anxiety, memory loss, chronic pain and all other reviewed systems are negative.   ALLERGIES: No Known Allergies   HOME MEDICATIONS: Outpatient Medications Prior to Visit  Medication Sig Dispense Refill   CALCIUM PO Take 1 tablet by mouth daily.     clopidogrel (PLAVIX) 75 MG tablet Take 1 tablet (75 mg total) by mouth daily. Start this after 4 weeks once you are done with Brilinta and aspirin. 90 tablet 3   fexofenadine (ALLEGRA ALLERGY) 180 MG tablet Take 180 mg by mouth daily.     OVER THE COUNTER MEDICATION Take 1 tablet by mouth 2 (two) times daily. Viviscal hair growth supplements     Rosuvastatin Calcium 10 MG CPSP Take 10 mg by mouth every evening.     No facility-administered medications prior to visit.     PAST MEDICAL HISTORY: Past Medical History:  Diagnosis Date   DDD (degenerative disc disease), lumbar    GAD (generalized anxiety disorder)    Headache    Hypercholesterolemia    Hyperglycemia    Hyperlipemia    Lumbar radiculopathy  Migraine    Osteoarthritis    Osteoporosis    Paresthesia of foot    right   Sacroiliac joint pain    Sciatica of right side    Scoliosis    Seasonal allergies    Stroke (HCC)      PAST SURGICAL HISTORY: Past Surgical History:  Procedure Laterality Date   HAND SURGERY Right    Right middle finger trigger release   OVARIAN CYST REMOVAL Left 1977     FAMILY HISTORY: Family History  Problem Relation Age of Onset   Stroke Mother        age 69    COPD Mother    Other Father        age 36 - complications from surgery   Hyperlipidemia Brother    Heart attack Brother        age 51     SOCIAL HISTORY: Social History   Socioeconomic History   Marital status: Married    Spouse name: Not on file   Number of children: 0   Years of education: 14   Highest education level: Associate degree: occupational, Scientist, product/process development, or vocational program  Occupational History   Occupation: Retired  Tobacco Use   Smoking status: Former    Current packs/day: 0.00    Average packs/day: 1 pack/day for 2.0 years (2.0 ttl pk-yrs)    Types: Cigarettes    Start date: 03/02/1977    Quit date: 03/03/1979    Years since quitting: 44.0    Passive exposure: Past   Smokeless tobacco: Never  Vaping Use   Vaping status: Never Used  Substance and Sexual Activity   Alcohol use: Yes    Comment: social   Drug use: Never   Sexual activity: Not on file  Other Topics Concern   Not on file  Social History Narrative   Lives at home with husband.   Right-handed.   One cup caffeine daily.   Social Determinants of Health   Financial Resource Strain: Not on file  Food Insecurity: No Food Insecurity (12/03/2022)   Hunger Vital Sign    Worried About Running Out of Food in the Last Year: Never true    Ran Out of Food in the Last Year: Never true  Transportation Needs: No Transportation Needs (12/03/2022)   PRAPARE - Administrator, Civil Service (Medical): No    Lack of Transportation (Non-Medical): No  Physical Activity: Not on file  Stress: Not on file  Social Connections: Not on file  Intimate Partner Violence: Not At Risk (12/03/2022)   Humiliation, Afraid, Rape, and Kick questionnaire    Fear of Current or Ex-Partner: No    Emotionally Abused: No    Physically Abused: No    Sexually Abused: No     PHYSICAL EXAM  There were no vitals filed for this visit. There is no height or weight on file to calculate BMI.  Generalized: Well  developed, in no acute distress  Cardiology: normal rate and rhythm, no murmur auscultated  Respiratory: clear to auscultation bilaterally    Neurological examination  Mentation: Alert oriented to time, place, history taking. Follows all commands speech and language fluent Cranial nerve II-XII: Pupils were equal round reactive to light. Extraocular movements were full, visual field were full on confrontational test. Facial sensation and strength were normal. Uvula tongue midline. Head turning and shoulder shrug  were normal and symmetric. Motor: The motor testing reveals 5 over 5 strength of all 4 extremities. Good symmetric  motor tone is noted throughout.  Sensory: Sensory testing is intact to soft touch on all 4 extremities. No evidence of extinction is noted.  Coordination: Cerebellar testing reveals good finger-nose-finger and heel-to-shin bilaterally.  Gait and station: Gait is normal. Tandem gait is normal. Romberg is negative. No drift is seen.  Reflexes: Deep tendon reflexes are symmetric and normal bilaterally.    DIAGNOSTIC DATA (LABS, IMAGING, TESTING) - I reviewed patient records, labs, notes, testing and imaging myself where available.  Lab Results  Component Value Date   WBC 7.6 12/03/2022   HGB 13.5 12/03/2022   HCT 41.0 12/03/2022   MCV 93.4 12/03/2022   PLT 294 12/03/2022      Component Value Date/Time   NA 139 12/04/2022 0339   K 3.7 12/04/2022 0339   CL 106 12/04/2022 0339   CO2 24 12/04/2022 0339   GLUCOSE 88 12/04/2022 0339   BUN 17 12/04/2022 0339   CREATININE 0.91 12/04/2022 0339   CREATININE 0.90 08/21/2022 1148   CALCIUM 9.0 12/04/2022 0339   PROT 5.9 (L) 11/02/2022 0159   ALBUMIN 3.5 11/02/2022 0159   AST 18 11/02/2022 0159   ALT 14 11/02/2022 0159   ALKPHOS 51 11/02/2022 0159   BILITOT 0.4 11/02/2022 0159   GFRNONAA >60 12/04/2022 0339   Lab Results  Component Value Date   CHOL 146 11/01/2022   HDL 56 11/01/2022   LDLCALC 74 11/01/2022    TRIG 81 11/01/2022   CHOLHDL 2.6 11/01/2022   Lab Results  Component Value Date   HGBA1C 5.8 (H) 11/01/2022   No results found for: "VITAMINB12" No results found for: "TSH"     12/24/2022   10:29 AM  MMSE - Mini Mental State Exam  Orientation to time 4  Orientation to Place 5  Registration 3  Attention/ Calculation 5  Recall 2  Language- name 2 objects 2  Language- repeat 1  Language- follow 3 step command 3  Language- read & follow direction 1  Write a sentence 1  Copy design 1  Total score 28         No data to display           ASSESSMENT AND PLAN  72 y.o. year old female  has a past medical history of DDD (degenerative disc disease), lumbar, GAD (generalized anxiety disorder), Headache, Hypercholesterolemia, Hyperglycemia, Hyperlipemia, Lumbar radiculopathy, Migraine, Osteoarthritis, Osteoporosis, Paresthesia of foot, Sacroiliac joint pain, Sciatica of right side, Scoliosis, Seasonal allergies, and Stroke (HCC). here with    No diagnosis found.  [facility] ***.  Healthy lifestyle habits encouraged. *** will follow up with PCP as directed. *** will return to see me in ***, sooner if needed. *** verbalizes understanding and agreement with this plan.   No orders of the defined types were placed in this encounter.    No orders of the defined types were placed in this encounter.    Shawnie Dapper, MSN, FNP-C 02/24/2023, 4:41 PM  [facility] [address] [place], [place] [address] [phone]

## 2023-02-25 ENCOUNTER — Ambulatory Visit: Payer: Medicare Other | Admitting: Family Medicine

## 2023-02-25 ENCOUNTER — Encounter: Payer: Self-pay | Admitting: Family Medicine

## 2023-02-25 ENCOUNTER — Ambulatory Visit (INDEPENDENT_AMBULATORY_CARE_PROVIDER_SITE_OTHER): Payer: Medicare Other | Admitting: Family Medicine

## 2023-02-25 VITALS — BP 102/79 | HR 87 | Ht 65.0 in | Wt 121.5 lb

## 2023-02-25 DIAGNOSIS — G3184 Mild cognitive impairment, so stated: Secondary | ICD-10-CM

## 2023-02-25 DIAGNOSIS — I639 Cerebral infarction, unspecified: Secondary | ICD-10-CM | POA: Diagnosis not present

## 2023-02-25 DIAGNOSIS — G43709 Chronic migraine without aura, not intractable, without status migrainosus: Secondary | ICD-10-CM | POA: Diagnosis not present

## 2023-02-25 MED ORDER — NURTEC 75 MG PO TBDP: 75.0000 mg | ORAL_TABLET | Freq: Every day | ORAL | 11 refills | Status: DC | PRN

## 2023-03-06 DIAGNOSIS — K219 Gastro-esophageal reflux disease without esophagitis: Secondary | ICD-10-CM | POA: Diagnosis not present

## 2023-03-11 ENCOUNTER — Telehealth: Payer: Self-pay | Admitting: Family Medicine

## 2023-03-11 MED ORDER — NURTEC 75 MG PO TBDP: ORAL_TABLET | ORAL | 0 refills | Status: DC

## 2023-03-11 NOTE — Telephone Encounter (Signed)
Pt aware of Amy message, samples left a front for patient to pick up.

## 2023-03-11 NOTE — Telephone Encounter (Signed)
Pt called wanting to know if there are any samples that she can get for the Nurtec. Pt states that they are $600 for her. Please advise.

## 2023-03-11 NOTE — Telephone Encounter (Signed)
Amy we have samples that I can give the patient however I called the pharmacy to check if PA is needed and was told it does not need PA.  Medication is expensive due to patient high deductible per pharmacy. I just wanted to let you know the cost of nurtec and you recommend other options?   Please advise

## 2023-03-17 ENCOUNTER — Telehealth: Payer: Self-pay | Admitting: Family Medicine

## 2023-03-17 MED ORDER — NURTEC 75 MG PO TBDP: 75.0000 mg | ORAL_TABLET | Freq: Every day | ORAL | 11 refills | Status: DC | PRN

## 2023-03-17 NOTE — Addendum Note (Signed)
Addended by: Shawnie Dapper L on: 03/17/2023 04:39 PM   Modules accepted: Orders

## 2023-03-17 NOTE — Telephone Encounter (Signed)
Pt called stating that her Rimegepant Sulfate (NURTEC) 75 MG TBDP Rx has to be rewritten staying Limit 16 per every 30 days. Pt would like to be called once this has been fixed and sent. Please advise.

## 2023-03-17 NOTE — Telephone Encounter (Signed)
Called patient and Patient requesting 16 tablets rather than 8 for a thirty day supply for a more efficient way of getting medication through insurance as 8 tablets will cost significantly more than 16 tabs. Please prescribe if agreeable

## 2023-03-21 ENCOUNTER — Other Ambulatory Visit (HOSPITAL_COMMUNITY): Payer: Self-pay

## 2023-03-25 DIAGNOSIS — Z961 Presence of intraocular lens: Secondary | ICD-10-CM | POA: Diagnosis not present

## 2023-03-25 DIAGNOSIS — H40013 Open angle with borderline findings, low risk, bilateral: Secondary | ICD-10-CM | POA: Diagnosis not present

## 2023-03-25 DIAGNOSIS — H04123 Dry eye syndrome of bilateral lacrimal glands: Secondary | ICD-10-CM | POA: Diagnosis not present

## 2023-04-16 DIAGNOSIS — Z23 Encounter for immunization: Secondary | ICD-10-CM | POA: Diagnosis not present

## 2023-04-29 DIAGNOSIS — E7800 Pure hypercholesterolemia, unspecified: Secondary | ICD-10-CM | POA: Diagnosis not present

## 2023-04-29 DIAGNOSIS — Z0000 Encounter for general adult medical examination without abnormal findings: Secondary | ICD-10-CM | POA: Diagnosis not present

## 2023-04-29 DIAGNOSIS — R7303 Prediabetes: Secondary | ICD-10-CM | POA: Diagnosis not present

## 2023-04-29 DIAGNOSIS — Z6820 Body mass index (BMI) 20.0-20.9, adult: Secondary | ICD-10-CM | POA: Diagnosis not present

## 2023-04-29 DIAGNOSIS — L309 Dermatitis, unspecified: Secondary | ICD-10-CM | POA: Diagnosis not present

## 2023-05-05 ENCOUNTER — Ambulatory Visit
Admission: RE | Admit: 2023-05-05 | Discharge: 2023-05-05 | Disposition: A | Discharge status: Home / Self Care | Payer: Medicare Other | Source: Ambulatory Visit | Attending: Emergency Medicine | Admitting: Emergency Medicine

## 2023-05-05 ENCOUNTER — Other Ambulatory Visit: Payer: Medicare Other

## 2023-05-05 DIAGNOSIS — R9389 Abnormal findings on diagnostic imaging of other specified body structures: Secondary | ICD-10-CM

## 2023-05-05 DIAGNOSIS — R918 Other nonspecific abnormal finding of lung field: Secondary | ICD-10-CM | POA: Diagnosis not present

## 2023-05-07 ENCOUNTER — Telehealth: Payer: Self-pay | Admitting: Emergency Medicine

## 2023-05-07 DIAGNOSIS — R9389 Abnormal findings on diagnostic imaging of other specified body structures: Secondary | ICD-10-CM

## 2023-05-07 NOTE — Telephone Encounter (Signed)
PT had an appt to rev the CT tomorrow but results are not in. Can we just call her w/results please?  [phone]

## 2023-05-08 ENCOUNTER — Ambulatory Visit: Payer: Medicare Other | Admitting: Emergency Medicine

## 2023-05-09 DIAGNOSIS — L738 Other specified follicular disorders: Secondary | ICD-10-CM | POA: Diagnosis not present

## 2023-05-09 DIAGNOSIS — L720 Epidermal cyst: Secondary | ICD-10-CM | POA: Diagnosis not present

## 2023-05-09 DIAGNOSIS — L658 Other specified nonscarring hair loss: Secondary | ICD-10-CM | POA: Diagnosis not present

## 2023-05-09 DIAGNOSIS — L821 Other seborrheic keratosis: Secondary | ICD-10-CM | POA: Diagnosis not present

## 2023-05-09 DIAGNOSIS — D2272 Melanocytic nevi of left lower limb, including hip: Secondary | ICD-10-CM | POA: Diagnosis not present

## 2023-05-09 DIAGNOSIS — L820 Inflamed seborrheic keratosis: Secondary | ICD-10-CM | POA: Diagnosis not present

## 2023-05-09 DIAGNOSIS — L816 Other disorders of diminished melanin formation: Secondary | ICD-10-CM | POA: Diagnosis not present

## 2023-05-09 DIAGNOSIS — D225 Melanocytic nevi of trunk: Secondary | ICD-10-CM | POA: Diagnosis not present

## 2023-05-09 DIAGNOSIS — R202 Paresthesia of skin: Secondary | ICD-10-CM | POA: Diagnosis not present

## 2023-05-09 DIAGNOSIS — C4441 Basal cell carcinoma of skin of scalp and neck: Secondary | ICD-10-CM | POA: Diagnosis not present

## 2023-05-09 DIAGNOSIS — D485 Neoplasm of uncertain behavior of skin: Secondary | ICD-10-CM | POA: Diagnosis not present

## 2023-05-09 DIAGNOSIS — L578 Other skin changes due to chronic exposure to nonionizing radiation: Secondary | ICD-10-CM | POA: Diagnosis not present

## 2023-05-09 DIAGNOSIS — M79641 Pain in right hand: Secondary | ICD-10-CM | POA: Diagnosis not present

## 2023-05-12 NOTE — Telephone Encounter (Signed)
I do not see any appointment currently. Will review and call her to discuss

## 2023-05-14 IMAGING — CR DG SACRUM/COCCYX 2+V
3 series · 3 of 3 positions shown · non-contrast
Comparison: 04/28/2017

CLINICAL DATA: history of sacral fracture

EXAM:
SACRUM AND COCCYX - 2+ VIEW

[w sacrum coccyx lat]
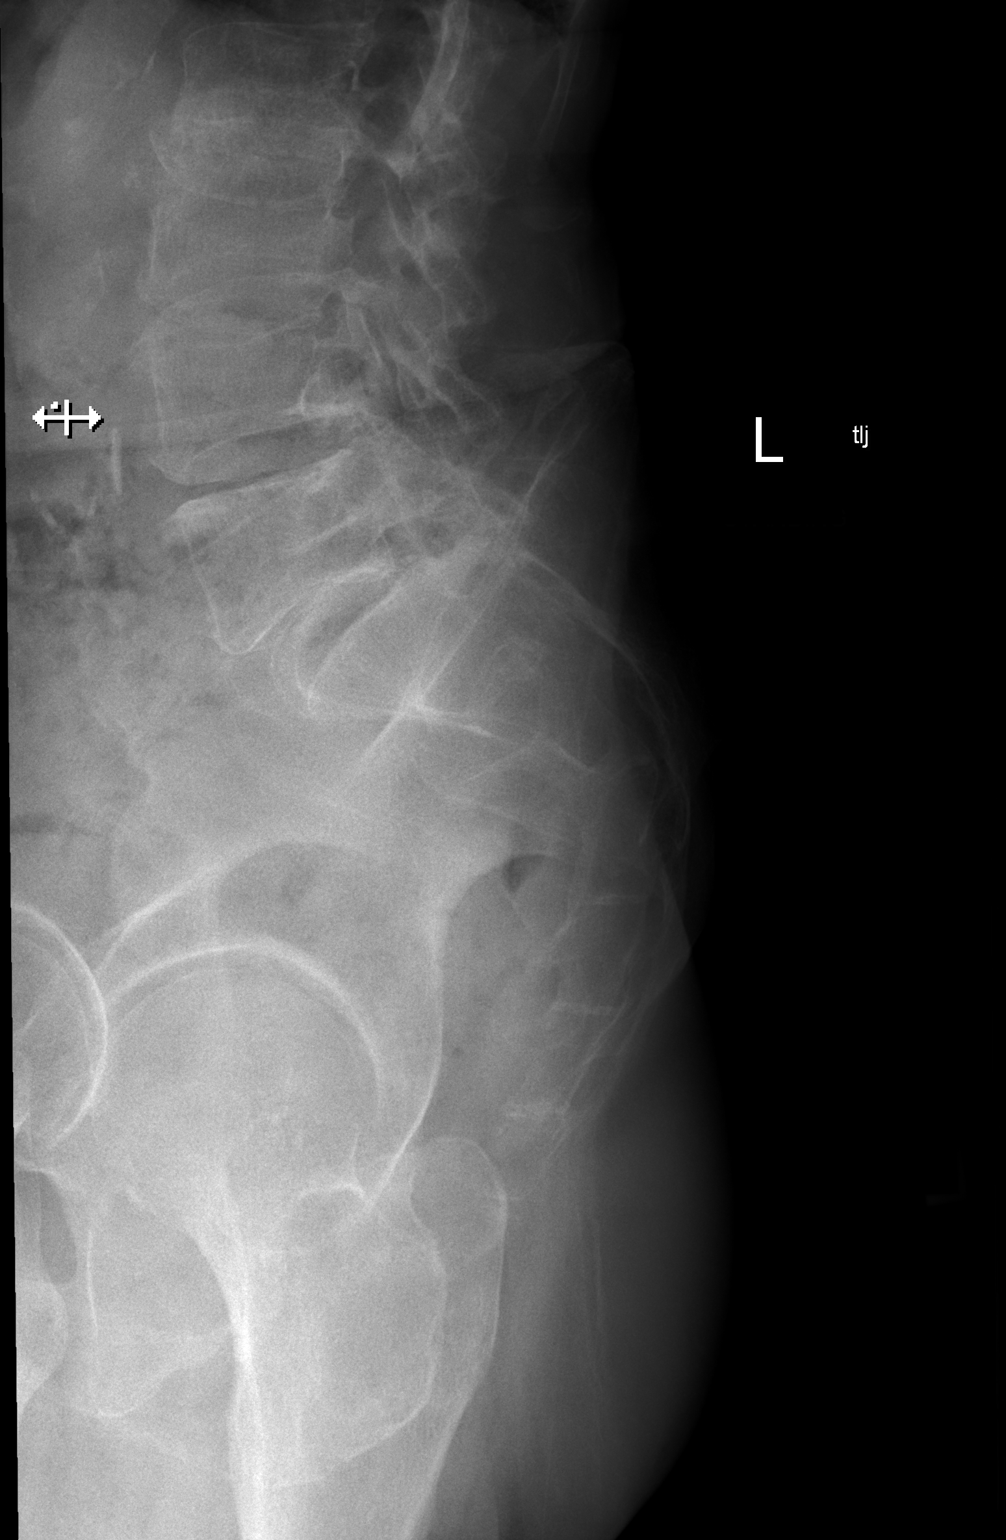

[w sacrum ap]
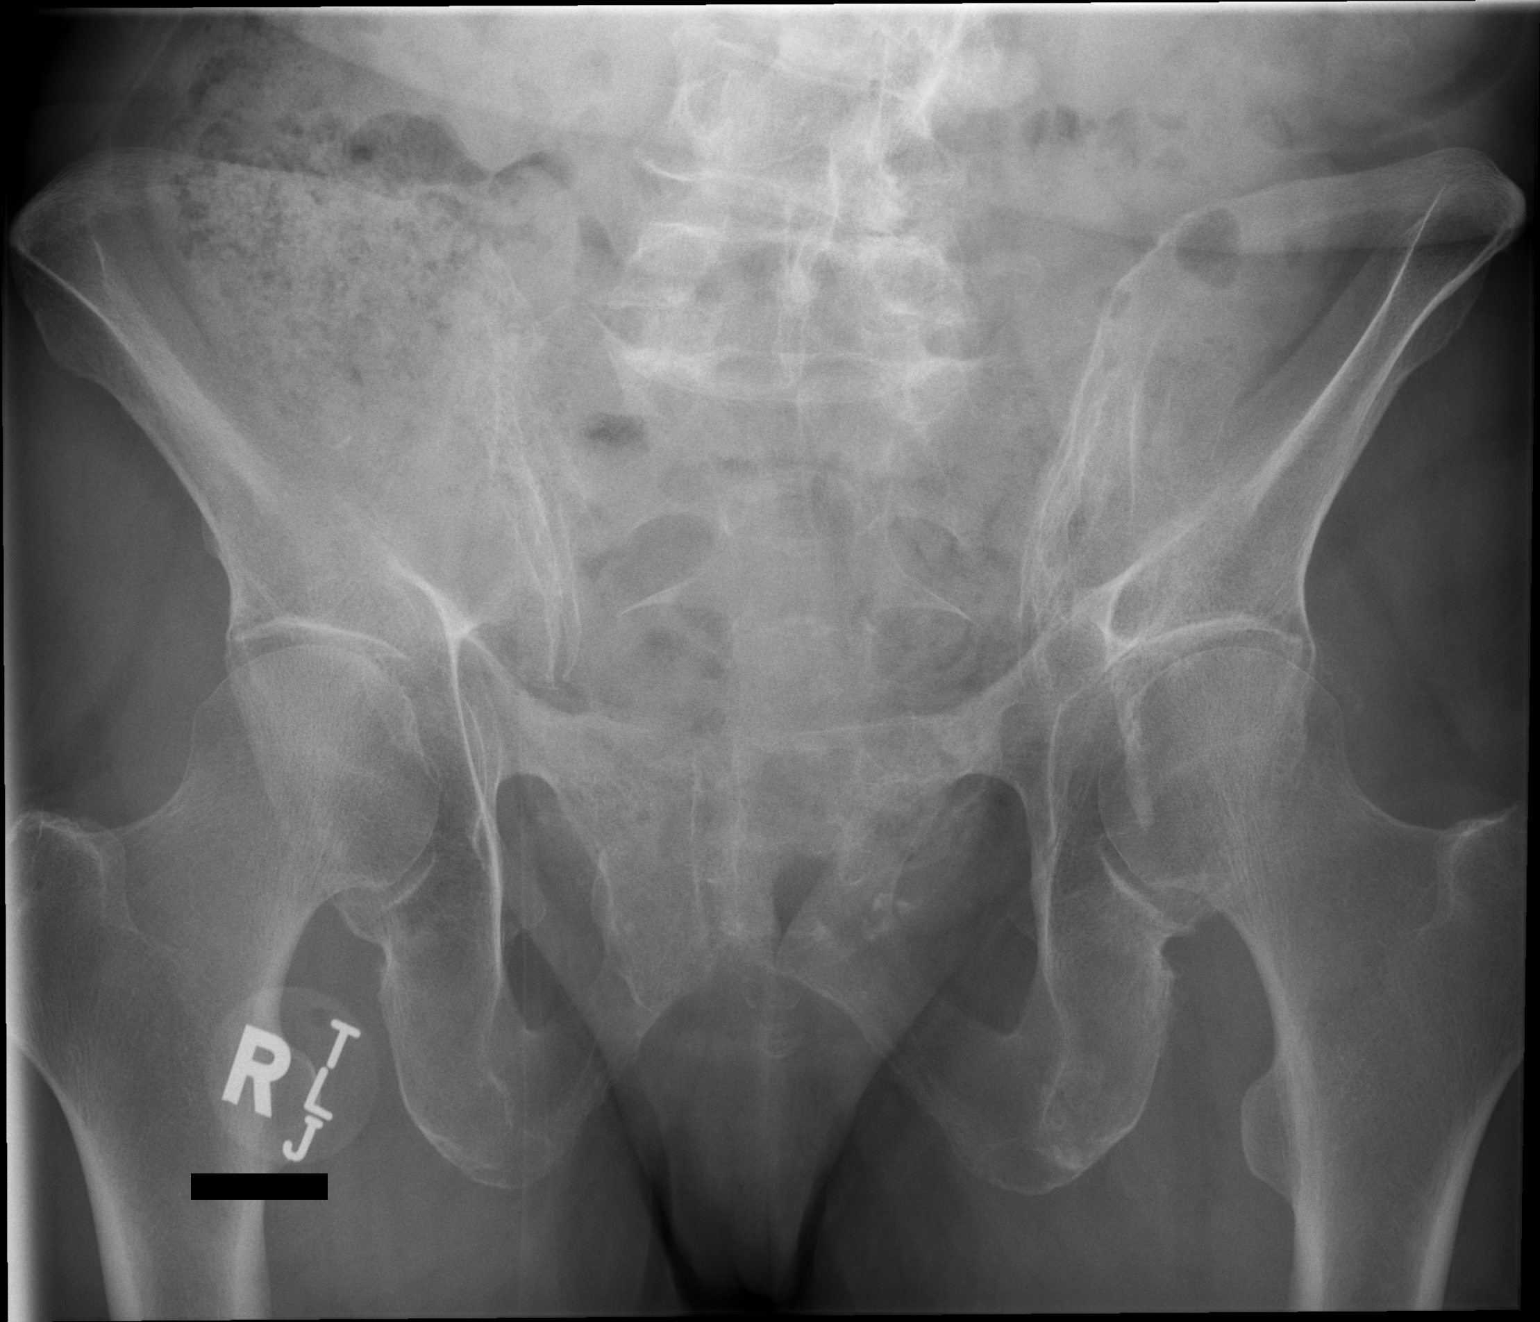

[w coccyx ap]
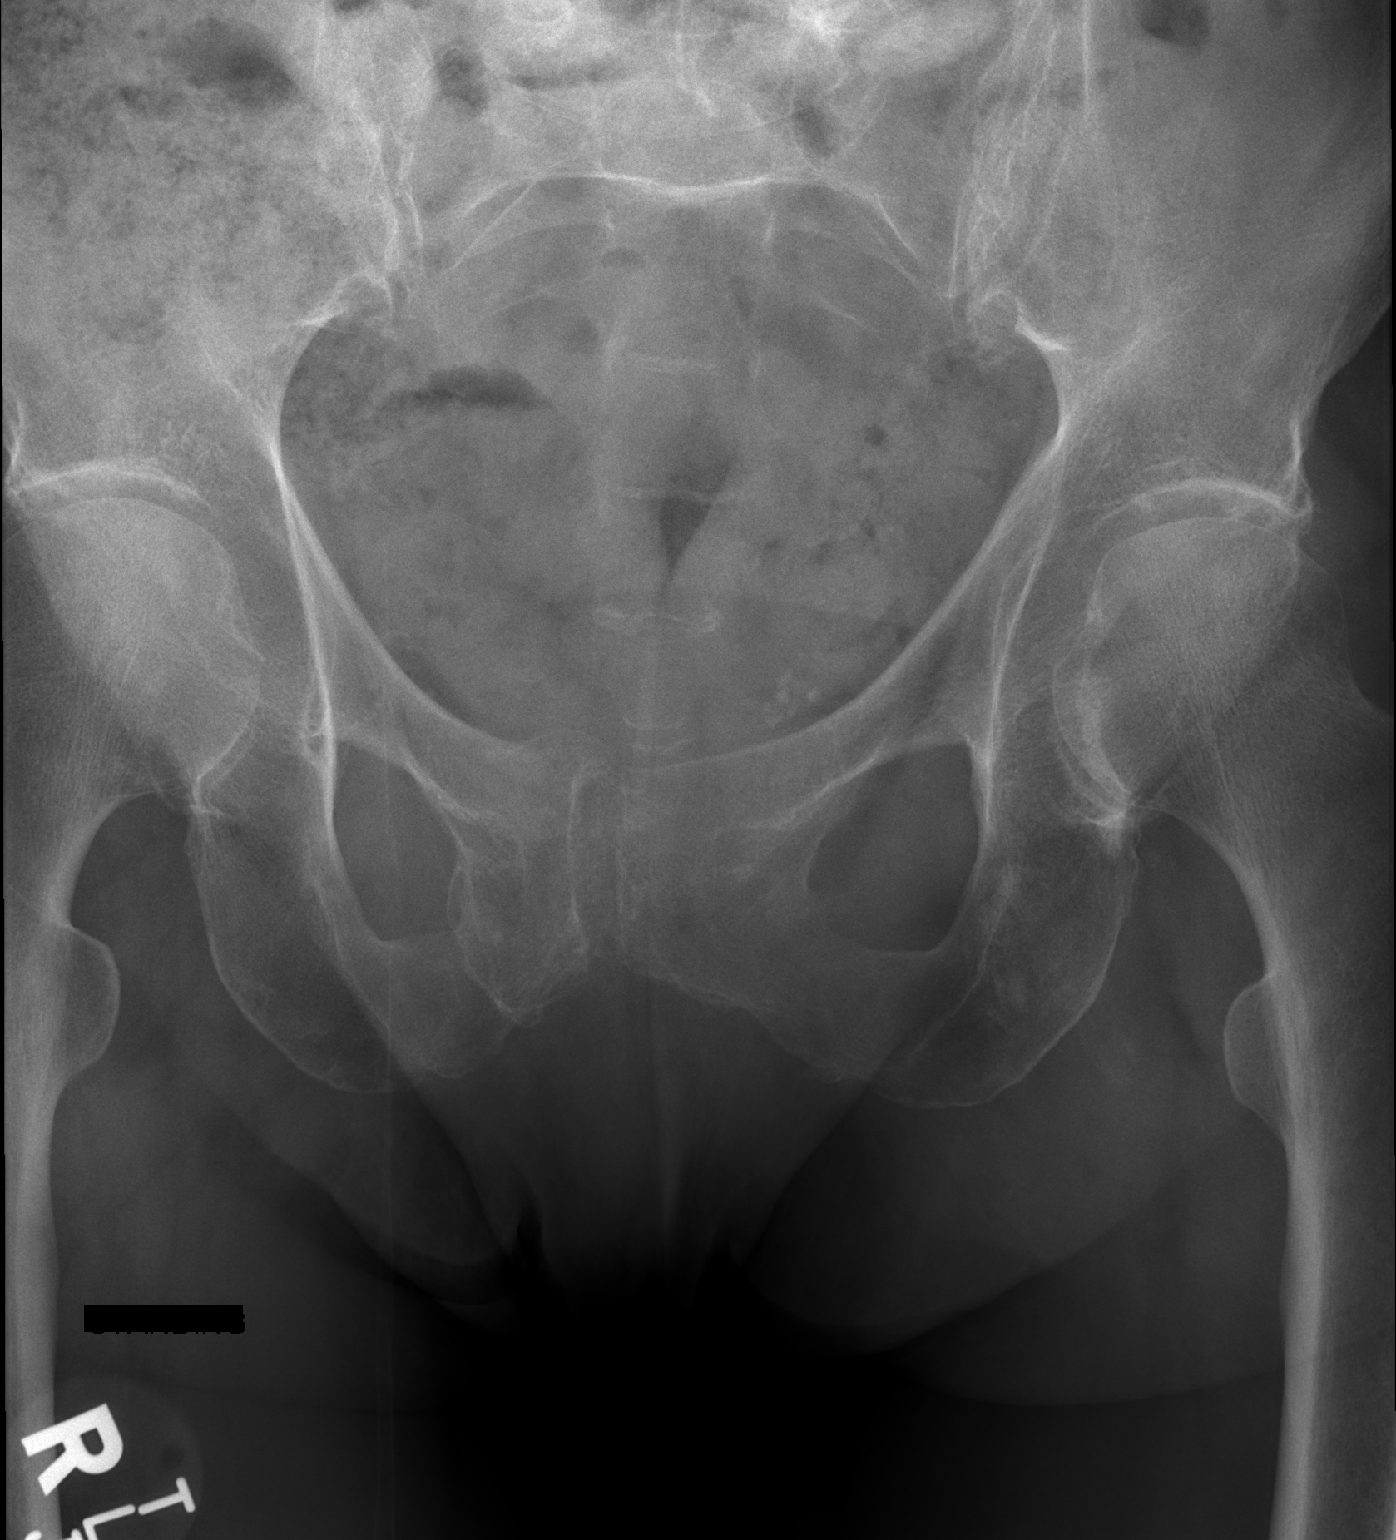

[3 of 3 positions shown; findings below may reference images not displayed]

FINDINGS: Old fracture deformity of superior and inferior right pubic rami. No
acute fracture or dislocation. Spondylitic changes in the lower
lumbar spine.
IMPRESSION: No acute findings.

Old right pubic rami fracture deformities.

## 2023-05-14 IMAGING — CR DG HIP (WITH OR WITHOUT PELVIS) 3-4V BILAT
3 series · 3 of 3 positions shown · non-contrast
Comparison: 04/28/2017

CLINICAL DATA: left hip pain

EXAM:
DG HIP (WITH OR WITHOUT PELVIS) 3-4V BILAT

[w hip ap right (1 of 2)]
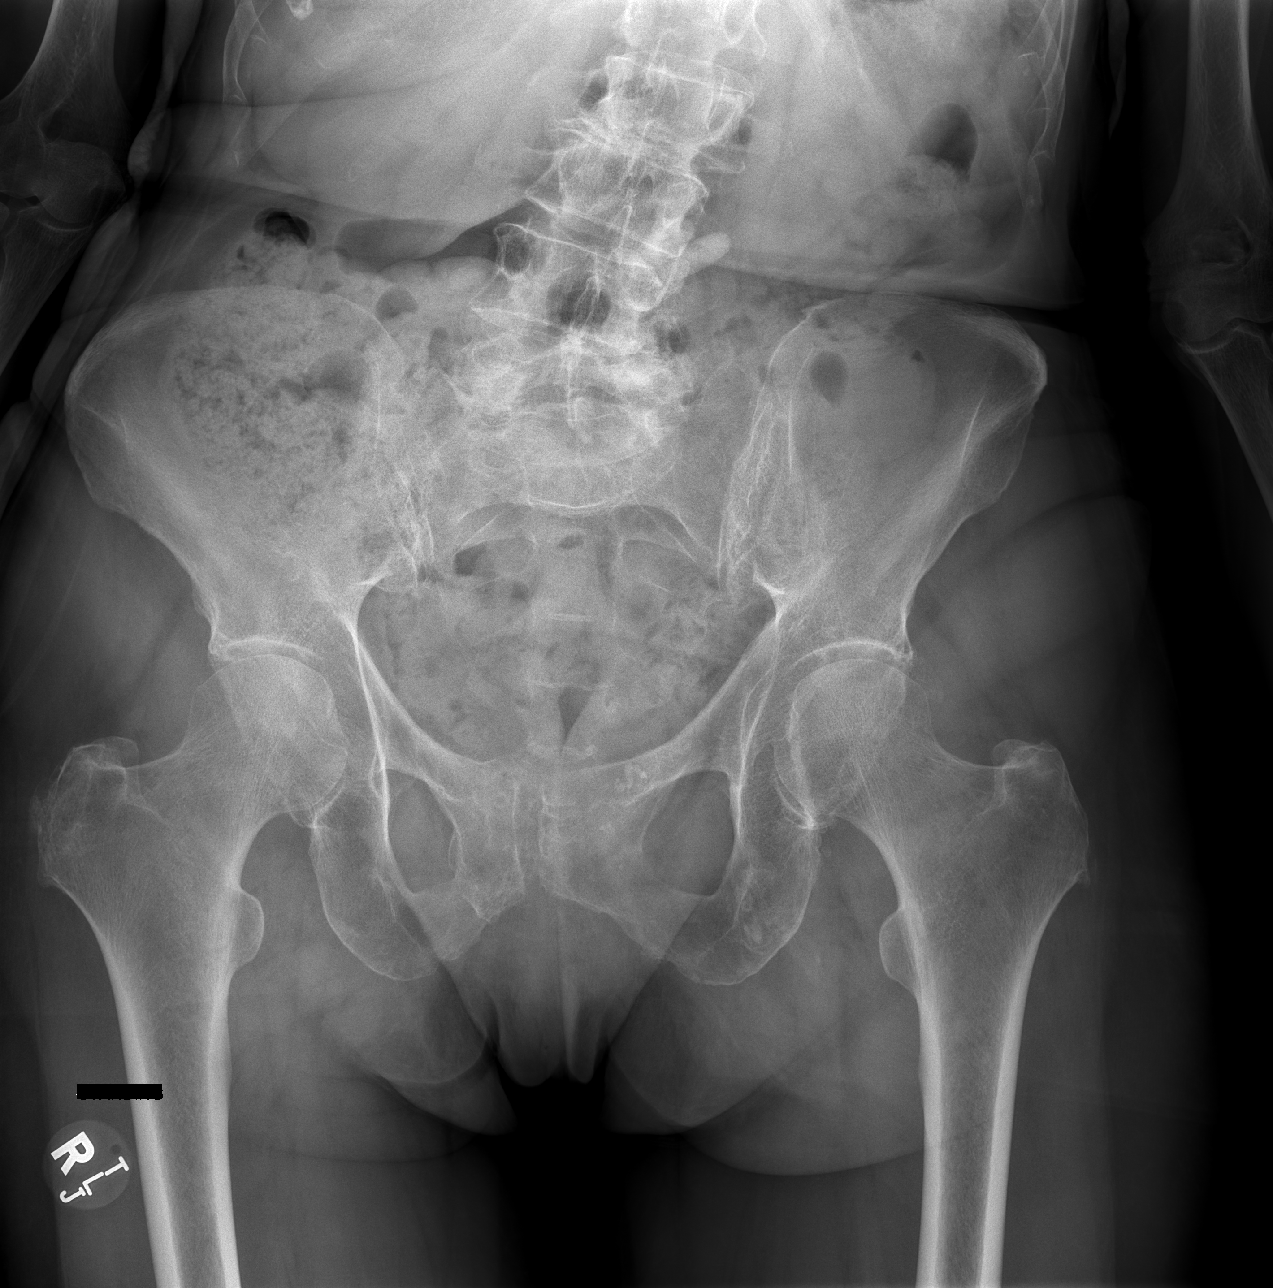

[w hip ap right (2 of 2)]
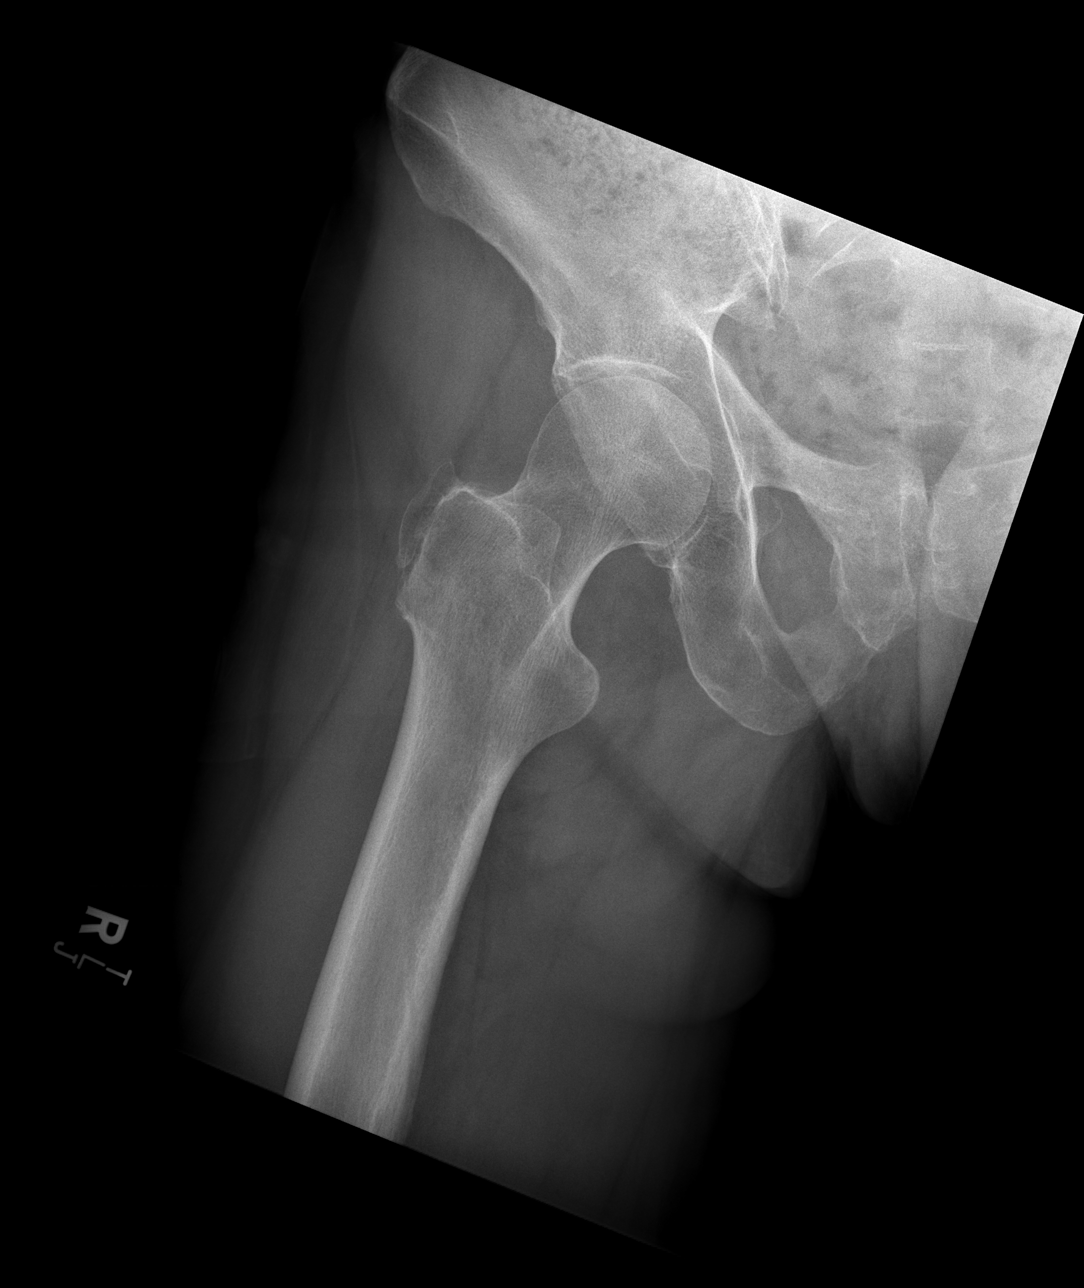

[w hip lat right]
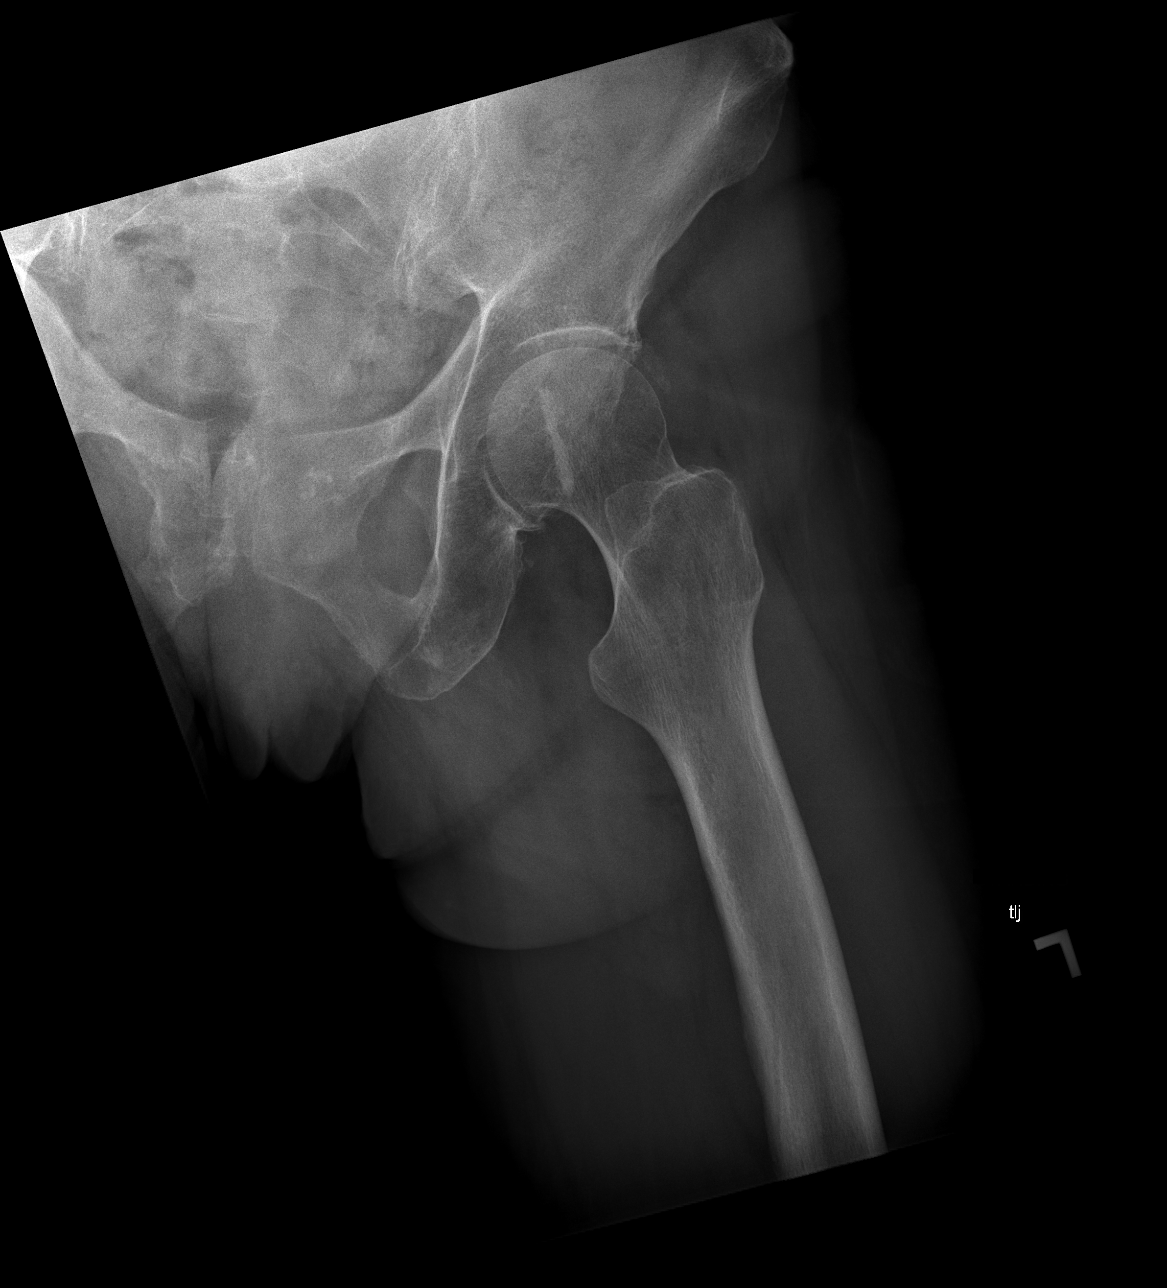

[3 of 3 positions shown; findings below may reference images not displayed]

FINDINGS: No fracture or dislocation. Lumbar levoscoliosis with lower lumbar
spondylitic change as before. Old right pubic rami fracture
deformities.
IMPRESSION: 1. No acute findings.
2. Degenerative and post traumatic changes as above.

## 2023-05-14 IMAGING — CR DG LUMBAR SPINE 2-3V
3 series · 3 of 3 positions shown · non-contrast
Comparison: 04/28/2017

CLINICAL DATA: unspecified pain in low back

EXAM:
LUMBAR SPINE - 2-3 VIEW

[w lumbar spine ap]
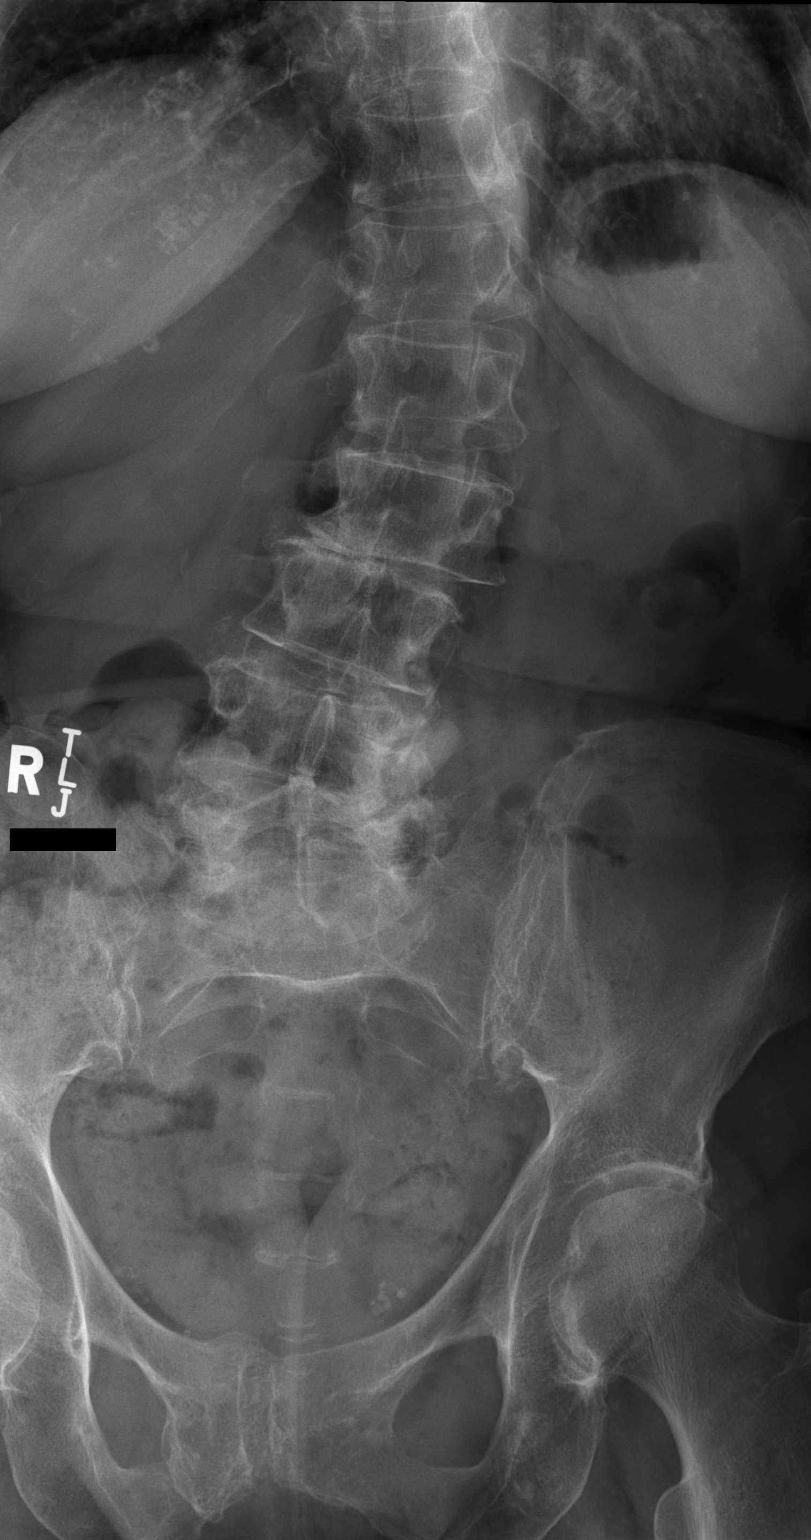

[w lumbar spine lat]
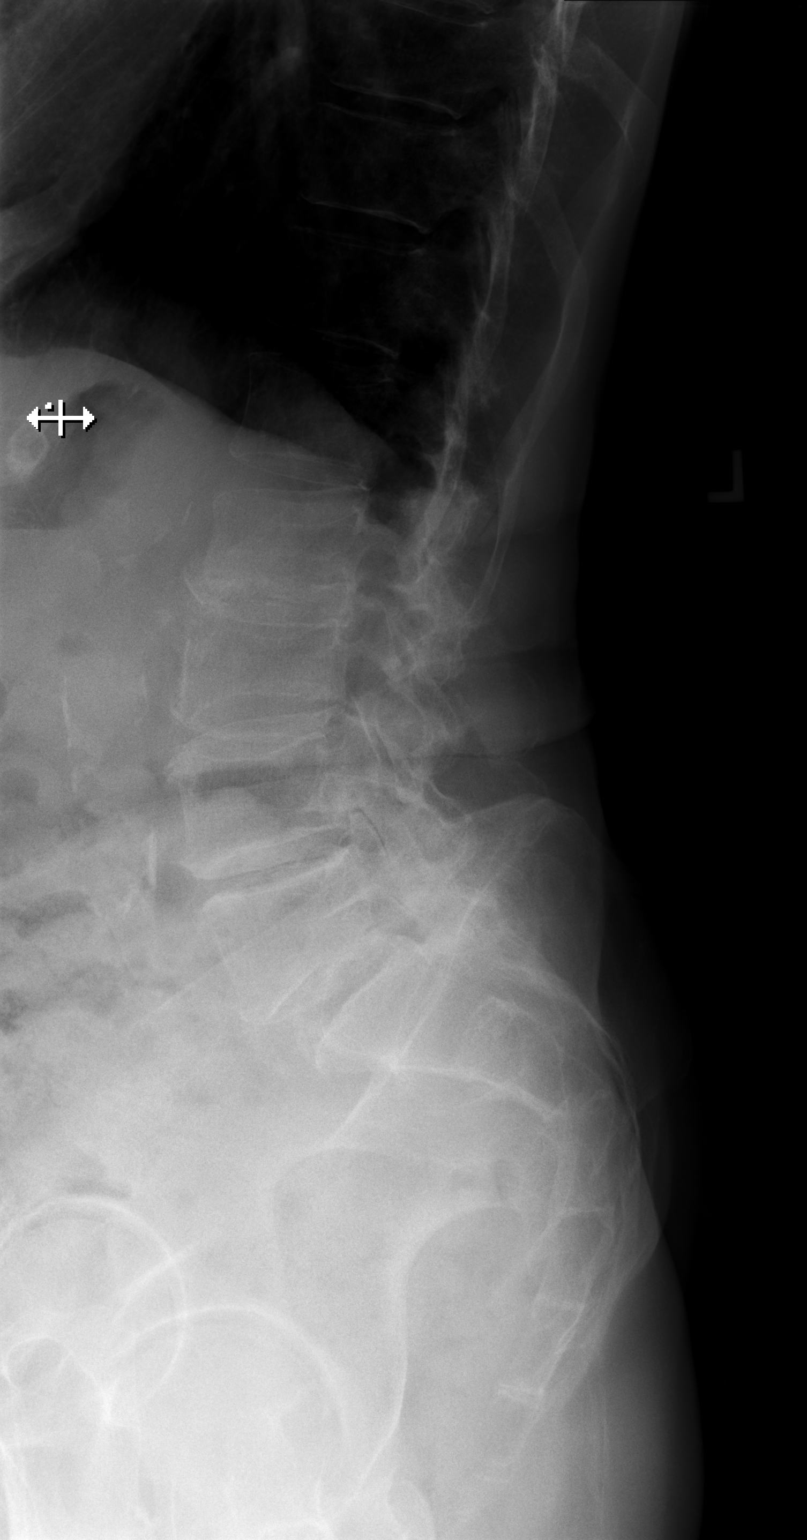

[w lumbar l-5 s-1 spot]
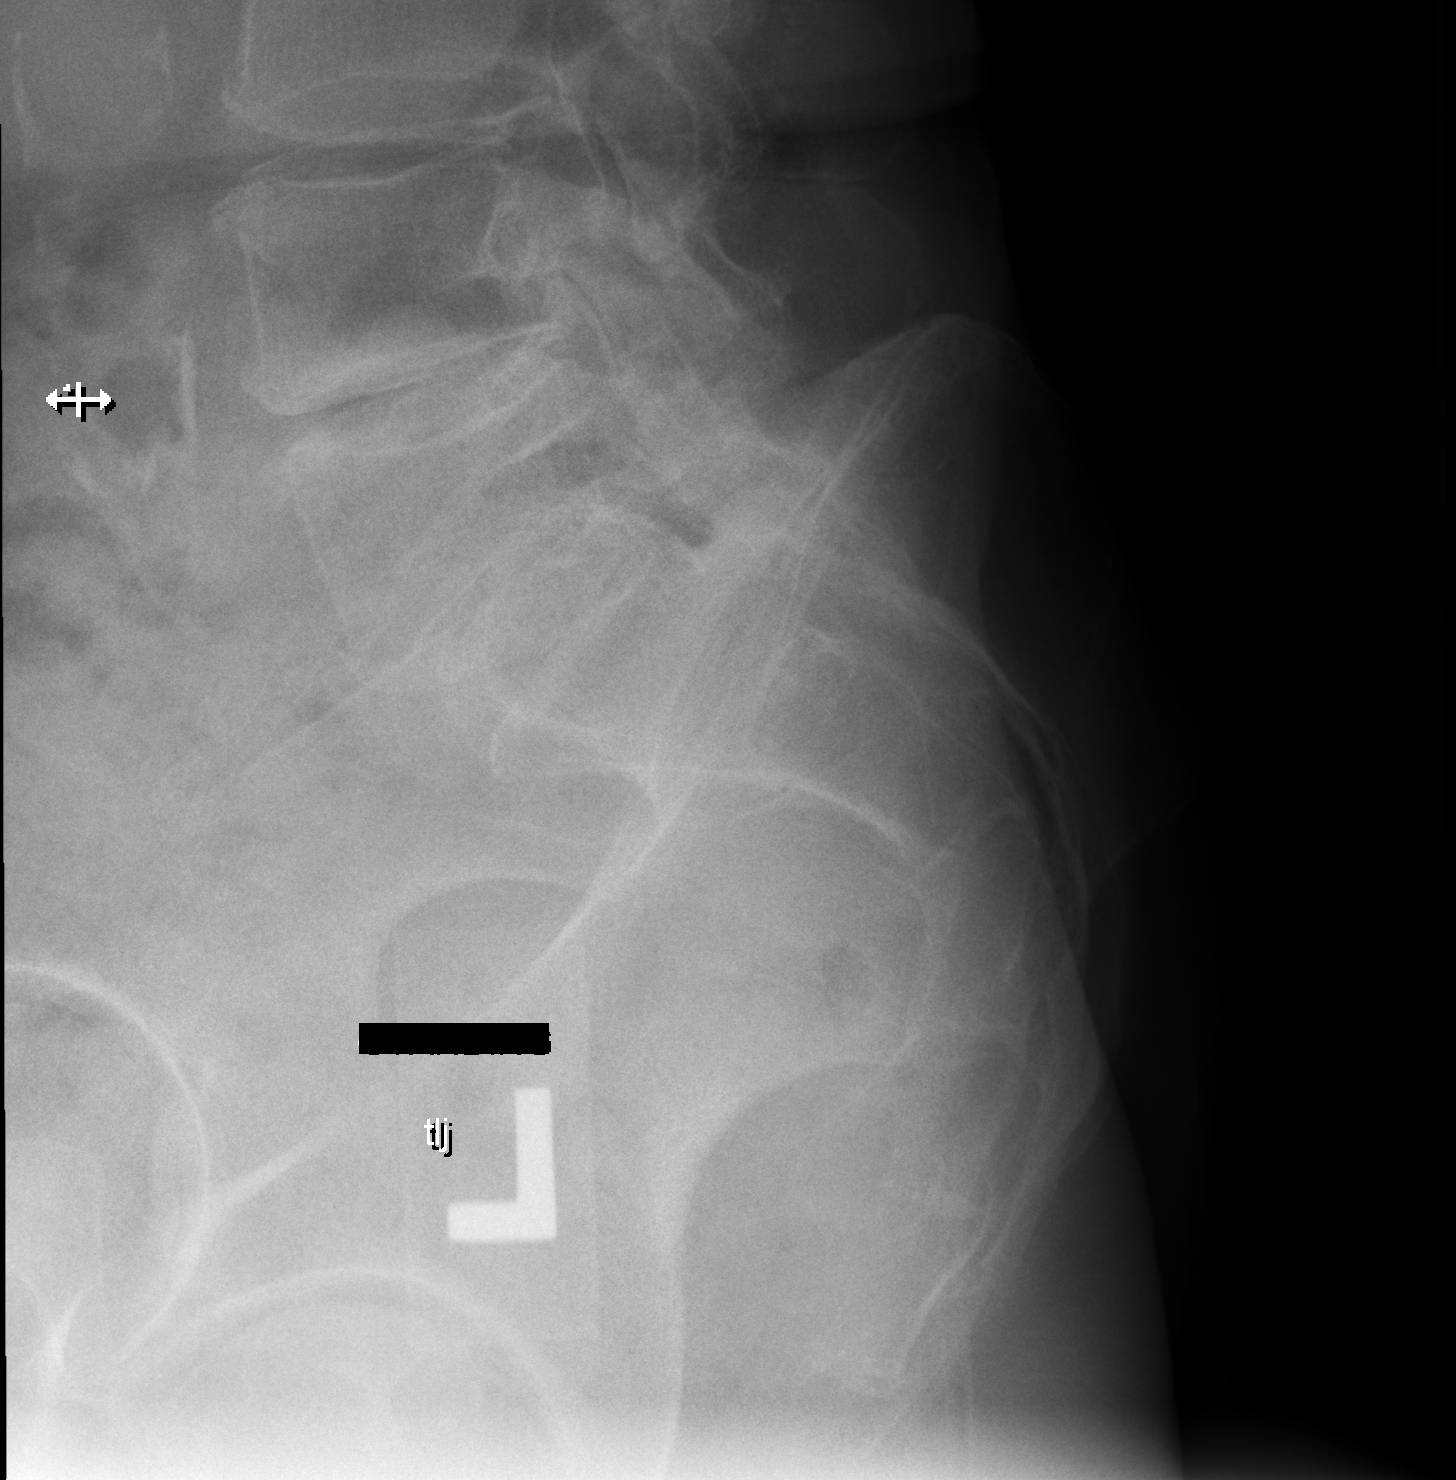

[3 of 3 positions shown; findings below may reference images not displayed]

FINDINGS: Lumbar levoscoliosis apex L1. Degenerative disc disease L2-S1. No
fracture or dislocation. No definite spondylolisthesis. Patchy
aortic calcifications without suggestion of aneurysm. Old fracture
deformities of right pubic rami.
IMPRESSION: 1. No acute findings.
2. Lumbar levoscoliosis with multilevel spondylitic change L2-S1.
3.  Aortic Atherosclerosis ([id number]-170.0).

## 2023-05-14 NOTE — Telephone Encounter (Signed)
I reviewed CT chest with patient by phone.  It is stable, unchanged compared with March 2024, with some biapical scarring and peripheral nodular change.  We will plan to repeat her CT chest in September 2025, then follow-up to review

## 2023-05-22 DIAGNOSIS — C4441 Basal cell carcinoma of skin of scalp and neck: Secondary | ICD-10-CM | POA: Diagnosis not present

## 2023-06-10 DIAGNOSIS — M79644 Pain in right finger(s): Secondary | ICD-10-CM | POA: Diagnosis not present

## 2023-06-10 DIAGNOSIS — M79645 Pain in left finger(s): Secondary | ICD-10-CM | POA: Diagnosis not present

## 2023-06-24 NOTE — Progress Notes (Unsigned)
No chief complaint on file.   HISTORY OF PRESENT ILLNESS:  06/24/23 ALL:  Tina Murray returns for follow up for migraines. She was last seen 02/2023 and reported 15-16 headache days with 4-5 being described as severe. She wished to try Nurtec for abortive therapy. Since,   She continues Plavix and rosuvastatin. Last LDL 74 10/2022. A1C 5.8. She is followed regularly by PCP.   02/25/2023 ALL:  Tina Murray is a 72 y.o. female here today for follow up for migraines. She is followed by Dr Pearlean Brownie for hx of TIA/? punctate infarct with MCI. Last seen 12/2022. Previously followed by Dr Terrace Arabia for neck pain and migraines.   She reports worsening headaches over the past month. She estimates about 15-16 headache days a month. Usually 4-5 are severe. She reports significant pain in neck, ears, nausea, light and sound sensitivity. Sometimes she will see a color wheel prior to headaches (a couple times a month) Sumatriptan was minimally effective. Nurtec seems to work better. She has been hesitant regarding prevention medicaitons. She took amitriptyline for anxiety in the past but felt it was ineffective.   She has continued Botox for cosmetics over the past 20 years. She reports more difficulty with asymmetry over the past couple of treatment cycles.   She continues Plavix and rosuvastatin.   Medications tried and failed: amitriptyline, sumatriptan   HISTORY (copied from Dr Marlis Edelson previous note)  HPI: Tina Murray is a 72 year old pleasant Caucasian lady seen today for initial office follow-up visit following recent hospital consultation for TIAs.  History is obtained from patient and review of electronic medical record I personally reviewed pertinent available imaging films in PACS.  She has past medical history of hyperlipidemia, migraines, generalized anxiety disorder, osteoporosis and degenerative disc disease.  Patient developed 2 separate episodes of speaking gibberish speech lasting 3 to 4 minutes 3 weeks  apart in March 2024.  Initial MRI scan on 10/18/2022 showed small punctate left temporoparietal white matter infarct.  Repeat MRI on 10/31/2022 following the recurrent episodes showed 2 tiny punctate right medial frontal embolic strokes next to each other.  She had a third episode in April 2024 at that time MRI of the brain on 11/2322 did not show any stroke.  She had a 30-day heart monitor which did not show any paroxysmal arrhythmias.  Patient had a EKG was normal but she refused prolonged EEG monitoring.  She also refused loop recorder.  2D echo showed normal ejection fraction on 11/03/2022 without cardiac source of embolism.  Lower extremity venous Dopplers were negative.  LDL cholesterol 74 mg percent.  Hemoglobin A1c was 5.8.  She had previously been started on aspirin and Plavix for 3 weeks and was on aspirin and she returned and this time she was switched to aspirin and Brilinta for 4 weeks to be followed by Plavix alone.  She was evaluated by speech therapy for cognitive eval and did not have any significant speech or language deficits to worry about degenerative disorder like primary progressive aphasia.  She states she has no further episodes of speech disturbance or any other kind of TIA since April.  She complains of mild short-term memory difficulties that she had had for a year but did admit to underlying anxiety and stress but was independent in activities of daily living.   REVIEW OF SYSTEMS: Out of a complete 14 system review of symptoms, the patient complains only of the following symptoms, headaches, anxiety, memory loss, chronic pain and all other reviewed systems  are negative.   ALLERGIES: No Known Allergies   HOME MEDICATIONS: Outpatient Medications Prior to Visit  Medication Sig Dispense Refill   CALCIUM PO Take 1 tablet by mouth daily.     clopidogrel (PLAVIX) 75 MG tablet Take 1 tablet (75 mg total) by mouth daily. Start this after 4 weeks once you are done with Brilinta and  aspirin. 90 tablet 3   fexofenadine (ALLEGRA ALLERGY) 180 MG tablet Take 180 mg by mouth daily.     OVER THE COUNTER MEDICATION Take 1 tablet by mouth 2 (two) times daily. Viviscal hair growth supplements     Rimegepant Sulfate (NURTEC) 75 MG TBDP Take 1 tablet (75 mg total) by mouth daily as needed (take for abortive therapy of migraine, no more than 1 tablet in 24 hours or 10 per month). 16 tablet 11   Rosuvastatin Calcium 10 MG CPSP Take 10 mg by mouth every evening.     No facility-administered medications prior to visit.     PAST MEDICAL HISTORY: Past Medical History:  Diagnosis Date   DDD (degenerative disc disease), lumbar    GAD (generalized anxiety disorder)    Headache    Hypercholesterolemia    Hyperglycemia    Hyperlipemia    Lumbar radiculopathy    Migraine    Osteoarthritis    Osteoporosis    Paresthesia of foot    right   Sacroiliac joint pain    Sciatica of right side    Scoliosis    Seasonal allergies    Stroke (HCC)    TIA (transient ischemic attack)    10/2022 and 11/2022     PAST SURGICAL HISTORY: Past Surgical History:  Procedure Laterality Date   HAND SURGERY Right    Right middle finger trigger release   OVARIAN CYST REMOVAL Left 1977     FAMILY HISTORY: Family History  Problem Relation Age of Onset   Stroke Mother        age 57   COPD Mother    Other Father        age 66 - complications from surgery   Hyperlipidemia Brother    Heart attack Brother        age 69     SOCIAL HISTORY: Social History   Socioeconomic History   Marital status: Married    Spouse name: Not on file   Number of children: 0   Years of education: 14   Highest education level: Associate degree: occupational, Scientist, product/process development, or vocational program  Occupational History   Occupation: Retired  Tobacco Use   Smoking status: Former    Current packs/day: 0.00    Average packs/day: 1 pack/day for 2.0 years (2.0 ttl pk-yrs)    Types: Cigarettes    Start date:  03/02/1977    Quit date: 03/03/1979    Years since quitting: 44.3    Passive exposure: Past   Smokeless tobacco: Never  Vaping Use   Vaping status: Never Used  Substance and Sexual Activity   Alcohol use: Yes    Comment: social   Drug use: Never   Sexual activity: Not on file  Other Topics Concern   Not on file  Social History Narrative   Lives at home with husband.   Right-handed.   One cup caffeine daily.   Social Determinants of Health   Financial Resource Strain: Not on file  Food Insecurity: No Food Insecurity (12/03/2022)   Hunger Vital Sign    Worried About Running Out of Food in the  Last Year: Never true    Ran Out of Food in the Last Year: Never true  Transportation Needs: No Transportation Needs (12/03/2022)   PRAPARE - Administrator, Civil Service (Medical): No    Lack of Transportation (Non-Medical): No  Physical Activity: Not on file  Stress: Not on file  Social Connections: Not on file  Intimate Partner Violence: Not At Risk (12/03/2022)   Humiliation, Afraid, Rape, and Kick questionnaire    Fear of Current or Ex-Partner: No    Emotionally Abused: No    Physically Abused: No    Sexually Abused: No     PHYSICAL EXAM  There were no vitals filed for this visit.  There is no height or weight on file to calculate BMI.  Generalized: Well developed, in no acute distress  Cardiology: normal rate and rhythm, no murmur auscultated  Respiratory: clear to auscultation bilaterally    Neurological examination  Mentation: Alert oriented to time, place, history taking. Follows all commands speech and language fluent Cranial nerve II-XII: Pupils were equal round reactive to light. Extraocular movements were full, visual field were full on confrontational test. Facial sensation and strength were normal. Uvula tongue midline. Head turning and shoulder shrug  were normal and symmetric. Motor: The motor testing reveals 5 over 5 strength of all 4 extremities.  Good symmetric motor tone is noted throughout.  Sensory: Sensory testing is intact to soft touch on all 4 extremities. No evidence of extinction is noted.  Coordination: Cerebellar testing reveals good finger-nose-finger and heel-to-shin bilaterally.  Gait and station: Gait is normal. Tandem gait is normal. Romberg is negative. No drift is seen.  Reflexes: Deep tendon reflexes are symmetric and normal bilaterally.    DIAGNOSTIC DATA (LABS, IMAGING, TESTING) - I reviewed patient records, labs, notes, testing and imaging myself where available.  Lab Results  Component Value Date   WBC 7.6 12/03/2022   HGB 13.5 12/03/2022   HCT 41.0 12/03/2022   MCV 93.4 12/03/2022   PLT 294 12/03/2022      Component Value Date/Time   NA 139 12/04/2022 0339   K 3.7 12/04/2022 0339   CL 106 12/04/2022 0339   CO2 24 12/04/2022 0339   GLUCOSE 88 12/04/2022 0339   BUN 17 12/04/2022 0339   CREATININE 0.91 12/04/2022 0339   CREATININE 0.90 08/21/2022 1148   CALCIUM 9.0 12/04/2022 0339   PROT 5.9 (L) 11/02/2022 0159   ALBUMIN 3.5 11/02/2022 0159   AST 18 11/02/2022 0159   ALT 14 11/02/2022 0159   ALKPHOS 51 11/02/2022 0159   BILITOT 0.4 11/02/2022 0159   GFRNONAA >60 12/04/2022 0339   Lab Results  Component Value Date   CHOL 146 11/01/2022   HDL 56 11/01/2022   LDLCALC 74 11/01/2022   TRIG 81 11/01/2022   CHOLHDL 2.6 11/01/2022   Lab Results  Component Value Date   HGBA1C 5.8 (H) 11/01/2022   No results found for: "VITAMINB12" No results found for: "TSH"     12/24/2022   10:29 AM  MMSE - Mini Mental State Exam  Orientation to time 4  Orientation to Place 5  Registration 3  Attention/ Calculation 5  Recall 2  Language- name 2 objects 2  Language- repeat 1  Language- follow 3 step command 3  Language- read & follow direction 1  Write a sentence 1  Copy design 1  Total score 28         No data to display  ASSESSMENT AND PLAN  72 y.o. year old female  has a  past medical history of DDD (degenerative disc disease), lumbar, GAD (generalized anxiety disorder), Headache, Hypercholesterolemia, Hyperglycemia, Hyperlipemia, Lumbar radiculopathy, Migraine, Osteoarthritis, Osteoporosis, Paresthesia of foot, Sacroiliac joint pain, Sciatica of right side, Scoliosis, Seasonal allergies, Stroke (HCC), and TIA (transient ischemic attack). here with    No diagnosis found.  Maurine Cradle reports worsening headaches over the past month. We have discussed prevention medicaitons but she is most comfortable taking something as needed. Triptans contraindicated with history of CVA. Nurtec previously effective. I will send rx for Nurtec 75mg  daily as needed for abortive therapy. Healthy lifestyle habits encouraged. She will follow up with PCP as directed. She will return to see me in 6 months, sooner if needed. She verbalizes understanding and agreement with this plan.   No orders of the defined types were placed in this encounter.    No orders of the defined types were placed in this encounter.   I spent 30 minutes of face-to-face and non-face-to-face time with patient.  This included previsit chart review, lab review, study review, order entry, electronic health record documentation, patient education.   Shawnie Dapper, MSN, FNP-C 06/24/2023, 9:18 AM  [facility] [address], [address] [place], [place] [address] [phone]

## 2023-06-24 NOTE — Patient Instructions (Signed)

## 2023-06-25 ENCOUNTER — Encounter: Payer: Self-pay | Admitting: Family Medicine

## 2023-06-25 ENCOUNTER — Ambulatory Visit: Payer: Medicare Other | Admitting: Family Medicine

## 2023-06-25 ENCOUNTER — Telehealth: Payer: Self-pay | Admitting: Family Medicine

## 2023-06-25 VITALS — BP 115/78 | HR 74 | Ht 65.0 in | Wt 123.5 lb

## 2023-06-25 DIAGNOSIS — G43709 Chronic migraine without aura, not intractable, without status migrainosus: Secondary | ICD-10-CM | POA: Diagnosis not present

## 2023-06-25 DIAGNOSIS — I639 Cerebral infarction, unspecified: Secondary | ICD-10-CM

## 2023-06-25 MED ORDER — NURTEC 75 MG PO TBDP: 75.0000 mg | ORAL_TABLET | Freq: Every day | ORAL | 11 refills | Status: DC | PRN

## 2023-06-25 NOTE — Telephone Encounter (Signed)
I called pt and relayed that redid the prescription for Nurtec for her. She should be able to get #16 the next time if her insurance allows.  May need a PA. She picked up prescription for #10 and wanted [place] to give her #6 tablets (samples).  I told her that we were not able to give her samples.  Those are for new pt on the drug.  She may call pharmacy and see if they will give her the extra 6 tab since new prescription.

## 2023-06-25 NOTE — Telephone Encounter (Signed)
I called pharmacy and they are closed till 2,  looks like last rx 03-2023 was for 16 tabs and 11 refills.

## 2023-06-25 NOTE — Addendum Note (Signed)
Addended by: Guy Begin on: 06/25/2023 03:19 PM   Modules accepted: Orders

## 2023-06-25 NOTE — Telephone Encounter (Addendum)
I called pharmacy.  The pt has been given 10tabs/month  as written on the prescription.  We can change the prescription leaving that statement out and then may be able to get the 16 tabs.

## 2023-06-25 NOTE — Telephone Encounter (Signed)
Pt. Came back in and says her RX was not called in for 16 pills,  just for 10. She said she spoke with pharmacy and she would like Amy to call in for the full 16. She already picked up the 10 so she can go back for the other 6.

## 2023-06-30 ENCOUNTER — Encounter: Payer: Self-pay | Admitting: Neurology

## 2023-07-25 ENCOUNTER — Encounter: Payer: Self-pay | Admitting: Internal Medicine

## 2023-07-28 ENCOUNTER — Ambulatory Visit (HOSPITAL_BASED_OUTPATIENT_CLINIC_OR_DEPARTMENT_OTHER): Payer: Medicare Other | Admitting: Cardiology

## 2023-08-01 DIAGNOSIS — Z8673 Personal history of transient ischemic attack (TIA), and cerebral infarction without residual deficits: Secondary | ICD-10-CM | POA: Diagnosis not present

## 2023-08-01 DIAGNOSIS — U071 COVID-19: Secondary | ICD-10-CM | POA: Diagnosis not present

## 2023-08-01 DIAGNOSIS — E7800 Pure hypercholesterolemia, unspecified: Secondary | ICD-10-CM | POA: Diagnosis not present

## 2023-08-01 DIAGNOSIS — Z6820 Body mass index (BMI) 20.0-20.9, adult: Secondary | ICD-10-CM | POA: Diagnosis not present

## 2023-09-04 ENCOUNTER — Other Ambulatory Visit: Payer: Self-pay | Admitting: Obstetrics and Gynecology

## 2023-09-04 ENCOUNTER — Ambulatory Visit: Payer: Medicare Other | Admitting: Family Medicine

## 2023-09-04 DIAGNOSIS — K439 Ventral hernia without obstruction or gangrene: Secondary | ICD-10-CM | POA: Diagnosis not present

## 2023-09-04 DIAGNOSIS — R319 Hematuria, unspecified: Secondary | ICD-10-CM | POA: Diagnosis not present

## 2023-09-04 DIAGNOSIS — Z6821 Body mass index (BMI) 21.0-21.9, adult: Secondary | ICD-10-CM | POA: Diagnosis not present

## 2023-09-04 DIAGNOSIS — R350 Frequency of micturition: Secondary | ICD-10-CM | POA: Diagnosis not present

## 2023-09-04 DIAGNOSIS — Z124 Encounter for screening for malignant neoplasm of cervix: Secondary | ICD-10-CM | POA: Diagnosis not present

## 2023-09-04 LAB — HM DEXA SCAN

## 2023-09-12 ENCOUNTER — Other Ambulatory Visit: Payer: Medicare Other

## 2023-09-17 ENCOUNTER — Ambulatory Visit (HOSPITAL_BASED_OUTPATIENT_CLINIC_OR_DEPARTMENT_OTHER): Payer: Medicare Other | Admitting: Cardiology

## 2023-09-17 ENCOUNTER — Encounter (HOSPITAL_BASED_OUTPATIENT_CLINIC_OR_DEPARTMENT_OTHER): Payer: Self-pay | Admitting: Cardiology

## 2023-09-17 ENCOUNTER — Ambulatory Visit (HOSPITAL_BASED_OUTPATIENT_CLINIC_OR_DEPARTMENT_OTHER)
Admission: RE | Admit: 2023-09-17 | Discharge: 2023-09-17 | Disposition: A | Discharge status: Home / Self Care | Payer: Medicare Other | Source: Ambulatory Visit | Attending: Obstetrics and Gynecology | Admitting: Obstetrics and Gynecology

## 2023-09-17 ENCOUNTER — Other Ambulatory Visit (HOSPITAL_BASED_OUTPATIENT_CLINIC_OR_DEPARTMENT_OTHER): Payer: Self-pay | Admitting: Obstetrics and Gynecology

## 2023-09-17 VITALS — BP 106/64 | HR 75 | Ht 65.0 in | Wt 126.5 lb

## 2023-09-17 DIAGNOSIS — Z8673 Personal history of transient ischemic attack (TIA), and cerebral infarction without residual deficits: Secondary | ICD-10-CM | POA: Diagnosis not present

## 2023-09-17 DIAGNOSIS — E7800 Pure hypercholesterolemia, unspecified: Secondary | ICD-10-CM

## 2023-09-17 DIAGNOSIS — R109 Unspecified abdominal pain: Secondary | ICD-10-CM | POA: Diagnosis not present

## 2023-09-17 DIAGNOSIS — K439 Ventral hernia without obstruction or gangrene: Secondary | ICD-10-CM | POA: Insufficient documentation

## 2023-09-17 DIAGNOSIS — Z8249 Family history of ischemic heart disease and other diseases of the circulatory system: Secondary | ICD-10-CM

## 2023-09-17 DIAGNOSIS — Z7189 Other specified counseling: Secondary | ICD-10-CM

## 2023-09-17 DIAGNOSIS — R932 Abnormal findings on diagnostic imaging of liver and biliary tract: Secondary | ICD-10-CM | POA: Diagnosis not present

## 2023-09-17 NOTE — Patient Instructions (Addendum)
 Medication Instructions:  Your physician recommends that you continue on your current medications as directed. Please refer to the Current Medication list given to you today.  Try increasing the rosuvastatin --ok to increase slowly, try going up to 15 mg dose for a week or two, then if that goes well go up to 20 mg daily. Recheck cholesterol once you have been on a steady dose for about 2 months.  *If you need a refill on your cardiac medications before your next appointment, please call your pharmacy*  Follow-Up: At [facility], you and your health needs are our priority.  As part of our continuing mission to provide you with exceptional heart care, we have created designated Provider Care Teams.  These Care Teams include your primary Cardiologist (physician) and Advanced Practice Providers (APPs -  Physician Assistants and Nurse Practitioners) who all work together to provide you with the care you need, when you need it.  We recommend signing up for the patient portal called MyChart.  Sign up information is provided on this After Visit Summary.  MyChart is used to connect with patients for Virtual Visits (Telemedicine).  Patients are able to view lab/test results, encounter notes, upcoming appointments, etc.  Non-urgent messages can be sent to your provider as well.   To learn more about what you can do with MyChart, go to [email or web address].    Your next appointment:   12 month(s)  Provider:   Shelda Bruckner, MD    Other Instructions

## 2023-09-17 NOTE — Progress Notes (Signed)
 Cardiology Office Note:  .   Date:  09/17/2023  ID:  Tina Murray, DOB 1950-09-17, MRN [id number] PCP: Delayne Artist PARAS, MD  [facility] Providers Cardiologist:  Shelda Bruckner, MD {  History of Present Illness: .   Tina Murray is a 73 y.o. female of CVA, migraines, hyperlipidemia. She was previously followed by Dr. Hobart and established care with me on 09/17/23.  Pertinent CV history: CVA diagnosed 10/2022, then recurrent TIA about a month later. No afib on 30 day monitor. TTE unremarkable. TCD negative. No TEE. Was generally healthy at the time, came out of nowhere (having speech difficulties).  Today: Overall doing well. Using home heart monitor, does this daily, has not seen any afib. Last LDL 88 04/2023. Going to see Dr. Skeet in a few months for both her migraines and CVA.   We reviewed recommendations for CVA from CV perspective, see below. Has excellent lifestyle.  ROS: Denies chest pain, shortness of breath at rest or with normal exertion. No PND, orthopnea, LE edema or unexpected weight gain. No syncope or palpitations. ROS otherwise negative except as noted.   Studies Reviewed: SABRA    EKG:       Physical Exam:   VS:  BP 106/64   Pulse 75   Ht 5' 5 (1.651 m)   Wt 126 lb 8 oz (57.4 kg)   SpO2 98%   BMI 21.05 kg/m    Wt Readings from Last 3 Encounters:  09/17/23 126 lb 8 oz (57.4 kg)  06/25/23 123 lb 8 oz (56 kg)  02/25/23 121 lb 8 oz (55.1 kg)    GEN: Well nourished, well developed in no acute distress HEENT: Normal, moist mucous membranes NECK: No JVD CARDIAC: regular rhythm, normal S1 and S2, no rubs or gallops. No murmur. VASCULAR: Radial and DP pulses 2+ bilaterally. No carotid bruits RESPIRATORY:  Clear to auscultation without rales, wheezing or rhonchi  ABDOMEN: Soft, non-tender, non-distended MUSCULOSKELETAL:  Ambulates independently SKIN: Warm and dry, no edema NEUROLOGIC:  Alert and oriented x 3. No focal neuro deficits  noted. PSYCHIATRIC:  Normal affect    ASSESSMENT AND PLAN: .    History of CVA -unclear etiology--30 day monitor negative and no evidence of afib on her daily monitoring, negative TCD, normal TTE -LDL 88, discussed goal of <29. On rosuvastatin  10 mg daily. Discussed increasing to 20 mg dose; tried 20 mg dose in the past, not sure what side effect she had on this in the past, but she is willing to go up slowly and try again. -on clopidogrel , I would recommended either aspirin  or clopidogrel  lifelone, defer which one to Dr. Skeet -eats healthy, but has genetics (mother had high cholesterol and a stroke, but was also a smoker with a poor diet)  Gave these instructions: Try increasing the rosuvastatin --ok to increase slowly, try going up to 15 mg dose for a week or two, then if that goes well go up to 20 mg daily. Recheck cholesterol once you have been on a steady dose for about 2 months.  CV risk counseling and prevention -recommend heart healthy/Mediterranean diet, with whole grains, fruits, vegetable, fish, lean meats, nuts, and olive oil. Limit salt. -recommend moderate walking, 3-5 times/week for 30-50 minutes each session. Aim for at least 150 minutes.week. Goal should be pace of 3 miles/hours, or walking 1.5 miles in 30 minutes -recommend avoidance of tobacco products. Avoid excess alcohol.  Dispo: 1 year or sooner as needed  Signed, Shelda Bruckner, MD  Shelda Bruckner, MD, PhD, [facility]  [facility]  [place]  [facility] at [facility] [address] [place], [place] [address] [phone]

## 2023-09-24 ENCOUNTER — Ambulatory Visit: Payer: Medicare Other | Admitting: Family Medicine

## 2023-09-30 ENCOUNTER — Other Ambulatory Visit: Payer: Medicare Other

## 2023-10-06 ENCOUNTER — Telehealth: Payer: Self-pay | Admitting: Cardiology

## 2023-10-06 NOTE — Telephone Encounter (Signed)
 Patient is requesting a copy of dick from 2/05 CT.

## 2023-10-07 ENCOUNTER — Telehealth: Payer: Self-pay | Admitting: Cardiology

## 2023-10-07 NOTE — Telephone Encounter (Signed)
 Called and spoke to patient. CT done by OBGYN. Patient wanted Dr. Cristal Deer to know of:  FINDINGS: Lower chest: No acute abnormality. Very small, fat containing bilateral Bochdalek's hernias (series 2, image 16)  Pt stated she tried increasing her Rosuvastatin to 20 mg but felt muscle cramps. Pt would like to try Pravastatin based on information she read online about medication.  Informed pt that MD is not in office today but will call her with MD's recommendations. She verbalized understanding.

## 2023-10-07 NOTE — Telephone Encounter (Signed)
 Pt is requesting cb to discuss CT results, she has not heard back regarding if we rec'vd them or not

## 2023-10-09 ENCOUNTER — Telehealth (HOSPITAL_BASED_OUTPATIENT_CLINIC_OR_DEPARTMENT_OTHER): Payer: Self-pay | Admitting: Cardiology

## 2023-10-09 NOTE — Telephone Encounter (Signed)
 Patient called to check if Dr. Di Kindle has had a chance to review her CT Scan results.

## 2023-10-17 ENCOUNTER — Telehealth: Payer: Self-pay | Admitting: *Deleted

## 2023-10-17 ENCOUNTER — Telehealth (HOSPITAL_BASED_OUTPATIENT_CLINIC_OR_DEPARTMENT_OTHER): Payer: Self-pay

## 2023-10-17 DIAGNOSIS — E7800 Pure hypercholesterolemia, unspecified: Secondary | ICD-10-CM

## 2023-10-17 DIAGNOSIS — Z8249 Family history of ischemic heart disease and other diseases of the circulatory system: Secondary | ICD-10-CM

## 2023-10-17 DIAGNOSIS — Z0000 Encounter for general adult medical examination without abnormal findings: Secondary | ICD-10-CM

## 2023-10-17 DIAGNOSIS — I639 Cerebral infarction, unspecified: Secondary | ICD-10-CM

## 2023-10-17 DIAGNOSIS — Z8673 Personal history of transient ischemic attack (TIA), and cerebral infarction without residual deficits: Secondary | ICD-10-CM

## 2023-10-17 DIAGNOSIS — Z7189 Other specified counseling: Secondary | ICD-10-CM

## 2023-10-17 DIAGNOSIS — Z131 Encounter for screening for diabetes mellitus: Secondary | ICD-10-CM

## 2023-10-17 NOTE — Telephone Encounter (Signed)
 Received call from call center.  Patient is frustrated that no one has called her back regarding CT that was ordered by OBGYN and if she can try out Pravastatin rather than Rosuvastatin.  MyChart message was sent to pt on 2/28 about MD's recommendations of Pravastatin.   Pt stated, "That's the least of my worries. I am talking about the plaque that is around my aorta and I want to get surgery but want to know if it safe for me to do so!"  Informed pt that CT was not ordered by this office and Dr. Cristal Deer was made aware of CT scan from previous note on 2/25 and made medication recommendations based on Pravastatin request. Advised that I can route this concern to another provider/APP for review regarding CT. Also made pt aware that Dr. Cristal Deer will be out of office for 2 weeks for medical reasons / surgery.  Pt stated, "I know she has her own issues and she isn't doing well but that's not my problem, ok? I get that she is going to have surgery but I need to know if I am ok! I know Dietrich Pates, is there any way she can see my CT and help me out?"  Will route concerns to APP and Dr. Tenny Craw for any recommendations regarding recent CT scan.

## 2023-10-17 NOTE — Telephone Encounter (Signed)
 Left message for patient to call back or respond to MyChart message with APP's recommendations. (OK per DPR)

## 2023-10-17 NOTE — Telephone Encounter (Signed)
 Pt would like to switch Cardiologist from Dr Cristal Deer to Dr Tenny Craw .Tina Murray

## 2023-10-17 NOTE — Telephone Encounter (Signed)
 Aortic atherosclerosis noted on imaging 10/2021, 04/2023, and 09/2023. This is not a new finding and is very common. It is not something that is going to require intervention, simply prompts [place] to make sure we are keeping cholesterol controlled to prevent further atherosclerosis. We would like LDL (bad cholesterol) less than 70 and when last checked 04/2023 it was 88.   From chart review, she did not tolerate higher doses of Rosuvastatin. Dr. Cristal Deer had recommended to add Zetia 10mg  daily with her Rosuvastatin 10mg  daily. Zetia works with the Rosuvastatin to lower cholesterol and is very well tolerated. It is a non-statin medication.   Pravastatin does not have as significant of lowering effect on cholesterol as Rosuvastatin. If she really wishes to try Pravastatin, she may but would require Pravastatin 40mg  daily to have a dose equivalent to Rosuvastatin.    Alver Sorrow, NP

## 2023-10-19 NOTE — Telephone Encounter (Signed)
 Spoke with pt on phone on 10/17/23    Se has appt with Dr Everlena Cooper, Recomm labs:  Lipomed, APoB, Lpa, A1C, CMET and CBC  Orders placed  Further recomm based on labs

## 2023-10-20 ENCOUNTER — Other Ambulatory Visit: Payer: Self-pay

## 2023-10-20 DIAGNOSIS — Z8249 Family history of ischemic heart disease and other diseases of the circulatory system: Secondary | ICD-10-CM | POA: Diagnosis not present

## 2023-10-20 DIAGNOSIS — Z0000 Encounter for general adult medical examination without abnormal findings: Secondary | ICD-10-CM

## 2023-10-20 DIAGNOSIS — I639 Cerebral infarction, unspecified: Secondary | ICD-10-CM | POA: Diagnosis not present

## 2023-10-20 DIAGNOSIS — Z7189 Other specified counseling: Secondary | ICD-10-CM | POA: Diagnosis not present

## 2023-10-20 DIAGNOSIS — Z131 Encounter for screening for diabetes mellitus: Secondary | ICD-10-CM | POA: Diagnosis not present

## 2023-10-20 DIAGNOSIS — E7800 Pure hypercholesterolemia, unspecified: Secondary | ICD-10-CM

## 2023-10-22 LAB — COMPREHENSIVE METABOLIC PANEL
ALT: 18 IU/L (ref 0–32)
AST: 25 IU/L (ref 0–40)
Albumin: 4.6 g/dL (ref 3.8–4.8)
Alkaline Phosphatase: 70 IU/L (ref 44–121)
BUN/Creatinine Ratio: 16 (ref 12–28)
BUN: 17 mg/dL (ref 8–27)
Bilirubin Total: 0.3 mg/dL (ref 0.0–1.2)
CO2: 23 mmol/L (ref 20–29)
Calcium: 9.7 mg/dL (ref 8.7–10.3)
Chloride: 103 mmol/L (ref 96–106)
Creatinine, Ser: 1.04 mg/dL — ABNORMAL HIGH (ref 0.57–1.00)
Globulin, Total: 1.8 g/dL (ref 1.5–4.5)
Glucose: 89 mg/dL (ref 70–99)
Potassium: 4.5 mmol/L (ref 3.5–5.2)
Sodium: 142 mmol/L (ref 134–144)
Total Protein: 6.4 g/dL (ref 6.0–8.5)
eGFR: 57 mL/min/{1.73_m2} — ABNORMAL LOW (ref >59)

## 2023-10-22 LAB — CBC
Hematocrit: 42.5 % (ref 34.0–46.6)
Hemoglobin: 13.9 g/dL (ref 11.1–15.9)
MCH: 30.5 pg (ref 26.6–33.0)
MCHC: 32.7 g/dL (ref 31.5–35.7)
MCV: 93 fL (ref 79–97)
Platelets: 288 10*3/uL (ref 150–450)
RBC: 4.56 x10E6/uL (ref 3.77–5.28)
RDW: 13.2 % (ref 11.7–15.4)
WBC: 5.7 10*3/uL (ref 3.4–10.8)

## 2023-10-22 LAB — LIPOPROTEIN A (LPA): Lipoprotein (a): 17.5 nmol/L (ref <75.0)

## 2023-10-22 LAB — NMR, LIPOPROFILE
Cholesterol, Total: 166 mg/dL (ref 100–199)
HDL Particle Number: 43.5 umol/L (ref >=30.5)
HDL-C: 70 mg/dL (ref >39)
LDL Particle Number: 958 nmol/L (ref <1000)
LDL Size: 20.7 nm (ref >20.5)
LDL-C (NIH Calc): 78 mg/dL (ref 0–99)
LP-IR Score: 34 (ref <=45)
Small LDL Particle Number: 498 nmol/L (ref <=527)
Triglycerides: 100 mg/dL (ref 0–149)

## 2023-10-22 LAB — HEMOGLOBIN A1C
Est. average glucose Bld gHb Est-mCnc: 120 mg/dL
Hgb A1c MFr Bld: 5.8 % — ABNORMAL HIGH (ref 4.8–5.6)

## 2023-10-22 NOTE — Telephone Encounter (Signed)
 Message sent to the schedulers to help make her an appt with Dr Tenny Craw and if not, place her on her wait list.

## 2023-10-22 NOTE — Telephone Encounter (Signed)
 Patient added to wait list

## 2023-10-24 ENCOUNTER — Other Ambulatory Visit: Payer: Self-pay

## 2023-10-24 DIAGNOSIS — Z8249 Family history of ischemic heart disease and other diseases of the circulatory system: Secondary | ICD-10-CM

## 2023-10-24 DIAGNOSIS — Z131 Encounter for screening for diabetes mellitus: Secondary | ICD-10-CM

## 2023-10-24 DIAGNOSIS — E7800 Pure hypercholesterolemia, unspecified: Secondary | ICD-10-CM

## 2023-10-27 ENCOUNTER — Other Ambulatory Visit: Payer: Self-pay | Admitting: Pharmacist

## 2023-10-27 DIAGNOSIS — R7303 Prediabetes: Secondary | ICD-10-CM | POA: Diagnosis not present

## 2023-10-27 DIAGNOSIS — E7800 Pure hypercholesterolemia, unspecified: Secondary | ICD-10-CM | POA: Diagnosis not present

## 2023-10-27 DIAGNOSIS — Z8673 Personal history of transient ischemic attack (TIA), and cerebral infarction without residual deficits: Secondary | ICD-10-CM | POA: Diagnosis not present

## 2023-10-27 DIAGNOSIS — Z6820 Body mass index (BMI) 20.0-20.9, adult: Secondary | ICD-10-CM | POA: Diagnosis not present

## 2023-10-27 NOTE — Progress Notes (Signed)
 Therapy plan placed for Reclast IV [phone]) for [facility] to start benefits investigation  Diagnosis: osteoporosis  Provider: Dr. Sheliah Hatch  Dose: 5mg  IV every 12 months  Last Clinic Visit: 11/20/2022 Next Clinic Visit: 03/03/2024  Pertinent Labs: CMET on 10/20/2023 - calcium normal, renal function ok to proceed with another RElcast

## 2023-10-28 ENCOUNTER — Telehealth: Payer: Self-pay

## 2023-10-28 NOTE — Telephone Encounter (Signed)
 Dr. Dimple Casey, patient will be scheduled as soon as possible.  Auth Submission: NO AUTH NEEDED Site of care: Site of care: [facility] Payer: Medicare A/B Medication & CPT/J Code(s) submitted: Reclast (Zolendronic acid) [id number] Route of submission (phone, fax, portal):  Phone # Fax # Auth type: Buy/Bill PB Units/visits requested: 5mg  x 1 dose Reference number:  Approval from: 10/28/23 to 08/11/24

## 2023-10-31 NOTE — Progress Notes (Signed)
 Reclast scheduled for 11/05/23

## 2023-11-05 ENCOUNTER — Ambulatory Visit

## 2023-11-05 VITALS — BP 127/79 | HR 65 | Temp 98.0°F | Resp 18 | Ht 65.0 in | Wt 126.8 lb

## 2023-11-05 DIAGNOSIS — M810 Age-related osteoporosis without current pathological fracture: Secondary | ICD-10-CM

## 2023-11-05 MED ORDER — ACETAMINOPHEN 325 MG PO TABS
650.0000 mg | ORAL_TABLET | Freq: Once | ORAL | Status: AC
  Administered 2023-11-05: 650 mg via ORAL
  Filled 2023-11-05: qty 2

## 2023-11-05 MED ORDER — DIPHENHYDRAMINE HCL 25 MG PO CAPS
25.0000 mg | ORAL_CAPSULE | Freq: Once | ORAL | Status: AC
  Administered 2023-11-05: 25 mg via ORAL
  Filled 2023-11-05: qty 1

## 2023-11-05 MED ORDER — ZOLEDRONIC ACID 5 MG/100ML IV SOLN
5.0000 mg | Freq: Once | INTRAVENOUS | Status: AC
  Administered 2023-11-05: 5 mg via INTRAVENOUS
  Filled 2023-11-05: qty 100

## 2023-11-05 NOTE — Progress Notes (Signed)
 Diagnosis: Osteoporosis  Provider:  Chilton Greathouse MD  Procedure: IV Infusion  IV Type: Peripheral, IV Location: R Antecubital  Reclast (Zolendronic Acid), Dose: 5 mg  Infusion Start Time: 1447  Infusion Stop Time: 1520  Post Infusion IV Care: Patient declined observation and Peripheral IV Discontinued  Discharge: Condition: Good, Destination: Home . AVS Provided  Performed by:  Garnette Czech, RN

## 2023-11-10 NOTE — Progress Notes (Unsigned)
 NEUROLOGY CONSULTATION NOTE  Francia Verry MRN: [id number] DOB: 10-20-1950  Referring provider: Romona Curls, MD Primary care provider: Romona Curls, MD  Reason for consult:  migraines  Assessment/Plan:   ***   Subjective:  Katherinne Mofield is a 73 year old ***-handed female with GAD, DDD of lumbar spine, HTN, HLD, and history of TIAs who presents for migraine.  History supplemented by ***  Migraines: Onset:  *** Location:  *** Quality:  *** Intensity:  ***.  *** denies new headache, thunderclap headache or severe headache that wakes *** from sleep. Aura:  *** Prodrome:  *** Postdrome:  *** Associated symptoms:  ***.  *** denies associated unilateral numbness or weakness. Duration:  *** Frequency:  *** Frequency of abortive medication: *** Triggers:  *** Relieving factors:  *** Activity:  ***  HIstory of CVA/TIAs: In March 2024, she had 2 separate episodes about 3 weeks apart of speaking gibberish for 3-4 minutes.  MRI of brain on 10/18/2022 revealed small punctate left temporoparietal white matter infarct with underlying moderate chronic small vessel ischemic changes.  Repeat MRI of brain, following second event, showed 2 punctate infarcts in the right medial frontal region, felt to be embolic.  She had a third similar event in April 2024.  MRI of brain on 12/03/2022 was negative for acute infarct.  30 day cardiac event monitor was negative for afib.  Declined loop recorder.  2D echo showed normal EF of *** without cardiac source of emboli.  Lower extremity venous doppler was negative.  LDL 74 and Hgb A1c was 5.8.  She was already on ASA and Plavix prior to third event and was switched to ASA and Brilinta for 4 weeks, followed by Plavix alone.  Enroses mild short-term memory problems.  Underwent speech therapy and cognitive evaluation which did not demonstrate significant language disorder.    Past NSAIDS/analgesics:  naproxen, acetaminophen, ibuprofen, ASA, celecoxib Past abortive  triptans:  sumatriptan tab Past abortive ergotamine:  *** Past muscle relaxants:  tizanidine Past anti-emetic:  Zofran ODT 4mg  Past antihypertensive medications:  *** Past antidepressant medications:  *** Past anticonvulsant medications:  gabapentin Past anti-CGRP:  Ubrelvy 50mg  Past vitamins/Herbal/Supplements:  *** Past antihistamines/decongestants:  loratadine Other past therapies:  ***  Current NSAIDS/analgesics:  *** Current triptans:  none Current ergotamine:  none Current anti-emetic:  none Current muscle relaxants:  none Current Antihypertensive medications:  none Current Antidepressant medications:  none Current Anticonvulsant medications:  none Current anti-CGRP:  Nurtec daily PRN Current Vitamins/Herbal/Supplements:  none Current Antihistamines/Decongestants:  Allegra Other therapy:  none Other medications:  Plavix 75mg  daily, rosuvastatin 10mg    Caffeine:  *** Alcohol:  *** Smoker:  *** Diet:  *** Exercise:  *** Depression:  ***; Anxiety:  *** Other pain:  *** Sleep hygiene:  *** Family history of headache:  ***      PAST MEDICAL HISTORY: Past Medical History:  Diagnosis Date   DDD (degenerative disc disease), lumbar    GAD (generalized anxiety disorder)    Headache    Hypercholesterolemia    Hyperglycemia    Hyperlipemia    Lumbar radiculopathy    Migraine    Osteoarthritis    Osteoporosis    Paresthesia of foot    right   Sacroiliac joint pain    Sciatica of right side    Scoliosis    Seasonal allergies    Stroke (HCC)    TIA (transient ischemic attack)    10/2022 and 11/2022    PAST SURGICAL HISTORY: Past Surgical History:  Procedure Laterality Date   HAND SURGERY Right    Right middle finger trigger release   OVARIAN CYST REMOVAL Left 1977    MEDICATIONS: Current Outpatient Medications on File Prior to Visit  Medication Sig Dispense Refill   CALCIUM PO Take 1 tablet by mouth daily.     clopidogrel (PLAVIX) 75 MG tablet Take 1  tablet (75 mg total) by mouth daily. Start this after 4 weeks once you are done with Brilinta and aspirin. 90 tablet 3   fexofenadine (ALLEGRA ALLERGY) 180 MG tablet Take 180 mg by mouth daily.     Rimegepant Sulfate (NURTEC) 75 MG TBDP Take 1 tablet (75 mg total) by mouth daily as needed (take for abortive therapy of migraine, no more than 1 tablet in 24 hours). 16 tablet 11   rosuvastatin (CRESTOR) 10 MG tablet Take 10 mg by mouth daily.     No current facility-administered medications on file prior to visit.    ALLERGIES: No Known Allergies  FAMILY HISTORY: Family History  Problem Relation Age of Onset   Stroke Mother        age 67   COPD Mother    Other Father        age 65 - complications from surgery   Hyperlipidemia Brother    Heart attack Brother        age 4    Objective:  *** General: No acute distress.  Patient appears well-groomed.   Head:  Normocephalic/atraumatic Eyes:  fundi examined but not visualized Neck: supple, no paraspinal tenderness, full range of motion Back: No paraspinal tenderness Heart: regular rate and rhythm Lungs: Clear to auscultation bilaterally. Vascular: No carotid bruits. Neurological Exam: Mental status: alert and oriented to person, place, and time, speech fluent and not dysarthric, language intact. Cranial nerves: CN I: not tested CN II: pupils equal, round and reactive to light, visual fields intact CN III, IV, VI:  full range of motion, no nystagmus, no ptosis CN V: facial sensation intact. CN VII: upper and lower face symmetric CN VIII: hearing intact CN IX, X: gag intact, uvula midline CN XI: sternocleidomastoid and trapezius muscles intact CN XII: tongue midline Bulk & Tone: normal, no fasciculations. Motor:  muscle strength 5/5 throughout Sensation:  Pinprick, temperature and vibratory sensation intact. Deep Tendon Reflexes:  2+ throughout,  toes downgoing.   Finger to nose testing:  Without dysmetria.   Heel to shin:   Without dysmetria.   Gait:  Normal station and stride.  Romberg negative.    Thank you for allowing me to take part in the care of this patient.  Shon Millet, DO  CC: ***

## 2023-11-11 ENCOUNTER — Ambulatory Visit (INDEPENDENT_AMBULATORY_CARE_PROVIDER_SITE_OTHER): Payer: Self-pay | Admitting: Neurology

## 2023-11-11 ENCOUNTER — Encounter: Payer: Self-pay | Admitting: Neurology

## 2023-11-11 VITALS — BP 116/68 | HR 82 | Resp 20 | Wt 124.0 lb

## 2023-11-11 DIAGNOSIS — G43109 Migraine with aura, not intractable, without status migrainosus: Secondary | ICD-10-CM | POA: Diagnosis not present

## 2023-11-11 DIAGNOSIS — G43009 Migraine without aura, not intractable, without status migrainosus: Secondary | ICD-10-CM

## 2023-11-11 DIAGNOSIS — Z8673 Personal history of transient ischemic attack (TIA), and cerebral infarction without residual deficits: Secondary | ICD-10-CM | POA: Diagnosis not present

## 2023-11-11 NOTE — Patient Instructions (Signed)
 Contact your insurance to ask about the following medications regarding coverage: Aimovig injection every 4 weeks Ajovy injection every 4 weeks Emgality injection every 4 weeks Qulipta daily pill Vyepti infusion every 3 months Let me know which you would like to start  Nurtec once daily as needed for migraine attack

## 2023-11-13 ENCOUNTER — Encounter: Payer: Self-pay | Admitting: Neurology

## 2023-11-13 ENCOUNTER — Telehealth: Payer: Self-pay | Admitting: Neurology

## 2023-11-13 NOTE — Telephone Encounter (Signed)
 PER patient PA needed for the two medication they will allow.  Aimovig 140 mg Emgality 120 mg  Please advised which one patient my start.

## 2023-11-13 NOTE — Telephone Encounter (Signed)
 Pt, would like to explain what is and is not covered with choosing an Rx for her, would like a call back

## 2023-11-16 ENCOUNTER — Other Ambulatory Visit: Payer: Self-pay | Admitting: Neurology

## 2023-11-16 MED ORDER — AIMOVIG 140 MG/ML ~~LOC~~ SOAJ: 140.0000 mg | SUBCUTANEOUS | 5 refills | Status: DC

## 2023-11-17 NOTE — Telephone Encounter (Signed)
 See mychart message.   Patient advised.

## 2023-12-16 DIAGNOSIS — Z1231 Encounter for screening mammogram for malignant neoplasm of breast: Secondary | ICD-10-CM | POA: Diagnosis not present

## 2023-12-26 ENCOUNTER — Telehealth: Payer: Self-pay | Admitting: Physical Medicine and Rehabilitation

## 2023-12-26 NOTE — Telephone Encounter (Signed)
 Pt request an appointment

## 2024-01-07 ENCOUNTER — Encounter: Payer: Self-pay | Admitting: Physical Medicine and Rehabilitation

## 2024-01-07 ENCOUNTER — Ambulatory Visit: Admitting: Physical Medicine and Rehabilitation

## 2024-01-07 DIAGNOSIS — M545 Low back pain, unspecified: Secondary | ICD-10-CM | POA: Diagnosis not present

## 2024-01-07 DIAGNOSIS — M542 Cervicalgia: Secondary | ICD-10-CM | POA: Diagnosis not present

## 2024-01-07 DIAGNOSIS — G8929 Other chronic pain: Secondary | ICD-10-CM

## 2024-01-07 DIAGNOSIS — M47816 Spondylosis without myelopathy or radiculopathy, lumbar region: Secondary | ICD-10-CM

## 2024-01-07 DIAGNOSIS — M7918 Myalgia, other site: Secondary | ICD-10-CM

## 2024-01-07 NOTE — Progress Notes (Unsigned)
 Tina Murray - 73 y.o. female MRN [id number]  Date of birth: 1951/02/12  Office Visit Note: Visit Date: 01/07/2024 PCP: Arlys Berke, MD Referred by: Arlys Berke, MD  Subjective: Chief Complaint  Patient presents with   Lower Back - Pain   HPI: Tina Murray is a 73 y.o. female who comes in today for evaluation of chronic, worsening and severe bilateral lower back pain, intermittent radiation of pain to buttocks and thigh regions. She was last seen in our office in 2023. Pain ongoing for several years, worsens over the last 2 months. No specific aggravating factors, her pain improves after walking for a short period. She describes pain as sore and aching sensation, currently rates as 10 out of 10.  History of formal physical therapy in the past and reports no relief of pain, states her pain increased with these treatments. Lumbar MRI imaging from 2023 exhibits mild levoconvex upper lumbar scoliosis and dextroconvex lower lumbar scoliosis, there is 4 mm anterolisthesis with suspicion for a chronic left L5 pars defect at the level of L5-S1. There is also multi-level facet hypertrophy, most advanced on the left at L4-L5. No high grade spinal canal stenosis noted. She underwent bilateral L4-L5 and L5-S1 radiofrequency ablation in our office in October of 2023, she reports greater than 80% relief of pain with this procedure for almost 2 years. Patient denies focal weakness, numbness and tingling. No recent trauma or falls.   Also reports chronic bilateral neck pain radiating to shoulders, upper back and up to head. She points to left trapezius region today and reports feeling a tender knot. States this knot radiates pain up to her head. No radicular symptoms down the arms. Prior cervical MRI imaging from 2019 shows moderate spinal canal stenosis at C5-C6.   Brief history, patient has remained adamant about avoid "steroid" injections for many years. She voiced today that she would like to avoid  these types of injections.   History of multiple TIA's, she is currently on Plavix .        Review of Systems  Musculoskeletal:  Positive for back pain, myalgias and neck pain.  Neurological:  Negative for tingling, sensory change, focal weakness and weakness.  All other systems reviewed and are negative.  Otherwise per HPI.  Assessment & Plan: Visit Diagnoses:    ICD-10-CM   1. Chronic bilateral low back pain without sciatica  M54.50    G89.29     2. Spondylosis without myelopathy or radiculopathy, lumbar region  M47.816     3. Facet arthropathy, lumbar  M47.816     4. Cervicalgia  M54.2     5. Myofascial pain syndrome  M79.18        Plan: Findings:  1. Chronic, worsening and severe bilateral lower back pain, intermittent radiation of pain to buttocks and thigh regions.  Bilateral lower back pain seems to be the biggest pain generator for her.  She continues to have severe pain despite good conservative therapy such as formal physical therapy, home exercise regimen, rest and use of medications. Patients clinical presentation and exam are consistent with facet mediated pain, could be a type of facet joint syndrome with intermittent referral to buttocks. Patient does have pain with lumbar extension upon exam today.  We discussed treatment plan in detail today.  Next step is to perform bilateral L4-L5 and L5-S1 radiofrequency ablation under fluoroscopic guidance. Patient verbalized understanding of RFA procedure. She has no questions at this time. No red flag symptoms noted upon  exam today.   2. Chronic bilateral neck pain radiating to shoulders, upper back and up to head, left greater than right. Patients clinical presentation and exam are consistent with myofascial pain syndrome. I offered to order short course of formal physical therapy, however she would like to hold on treatments for her neck at this time. I encouraged her to follow up as needed. Would consider obtaining new  cervical MRI imaging at some point, prior imaging is from 2019. She continues to remain adamant about avoid cervical epidural steroid injections.     Meds & Orders: No orders of the defined types were placed in this encounter.  No orders of the defined types were placed in this encounter.   Follow-up: Return for Bilateral L4-L5 and L5-S1 radiofrequency ablation.   Procedures: No procedures performed      Clinical History: MRI of the cervical spine  FINDINGS: :  On sagittal images, the spine is imaged from above the cervicomedullary junction to T2.   The spinal cord is of normal caliber and signal.   There is a 3 mm of retrolisthesis of C5 upon C6 associated with severe loss of disc height.  There is 1 to 2 mm of retrolisthesis of C6 upon C7 associated with moderate loss of disc height.  There is also moderate loss of disc height at C4-C5.   The vertebral bodies have normal signal.     The discs and interspaces were further evaluated on axial views from C2 to T1 as follows:   C2-C3: There is mild right facet hypertrophy.  The neural foramen foramina are not narrowed and there is no nerve root compression.   C3-C4: There is right uncovertebral spurring and mild left facet hypertrophy.  There is mild to moderate left foraminal narrowing but no nerve root compression.   C4-C5: The central canal is narrowed but not enough to be considered spinal stenosis.  There is left uncovertebral spurring and left facet hypertrophy.  There is moderately severe left foraminal narrowing with some potential for left C5 nerve root compression   C5-C6: There is mild retrolisthesis of C5 upon C6 and severe right,  moderate left uncovertebral spurring and mild right facet hypertrophy leading to moderate spinal stenosis and right greater than left foraminal narrowing..   The neural foramina are moderately narrowed.    C6-C7: There is minimal spinal stenosis due to disc protrusion, borderline retrolisthesis and mild  uncovertebral spurring.  The neuroforamina are mildly to moderately narrowed but there does not appear to be any nerve root compression.   C7-T1: There is mild disc bulging that does not lead to any spinal stenosis or nerve root compression.  IMPRESSION: This MRI of the cervical spine without contrast shows the following: 1.   The spinal cord appears normal. 2.   At C4-C5, there are degenerative changes more on the left causing moderately severe left foraminal narrowing with some potential for left C5 nerve root compression. 3.   At C5-C6, there is retrolisthesis and other degenerative changes causing moderate spinal stenosis.  Neural foramina are moderately narrowed but there does not appear to be any definite nerve root compression. 4.   At C6-C7, there is minimal spinal stenosis but no nerve root compression. 5.   Milder degenerative changes at C2-C3, C3-C4 and C7-T1 do not lead to any spinal stenosis or nerve root compression.  Richard A. Godwin Lat, MD, PhD, [facility]   She reports that she quit smoking about 44 years ago. Her smoking use included cigarettes.  She started smoking about 46 years ago. She has a 2 pack-year smoking history. She has been exposed to tobacco smoke. She has never used smokeless tobacco.  Recent Labs    10/20/23 0859  HGBA1C 5.8*    Objective:  VS:  HT:    WT:   BMI:     BP:   HR: bpm  TEMP: ( )  RESP:  Physical Exam Vitals and nursing note reviewed.  HENT:     Head: Normocephalic and atraumatic.     Right Ear: External ear normal.     Left Ear: External ear normal.     Nose: Nose normal.     Mouth/Throat:     Mouth: Mucous membranes are moist.  Eyes:     Extraocular Movements: Extraocular movements intact.  Cardiovascular:     Rate and Rhythm: Normal rate.     Pulses: Normal pulses.  Pulmonary:     Effort: Pulmonary effort is normal.  Abdominal:     General: Abdomen is flat. There is no distension.  Musculoskeletal:        General: Tenderness  present.     Cervical back: Tenderness present.     Comments: Patient rises from seated position to standing without difficulty. Pain noted with facet loading and lumbar extension. 5/5 strength noted with bilateral hip flexion, knee flexion/extension, ankle dorsiflexion/plantarflexion and EHL. No clonus noted bilaterally. No pain upon palpation of greater trochanters. No pain with internal/external rotation of bilateral hips. Sensation intact bilaterally. Negative slump test bilaterally. Ambulates without aid, gait steady.   No discomfort noted with flexion, extension and side-to-side rotation. Patient has good strength in the upper extremities including 5 out of 5 strength in wrist extension, long finger flexion and APB. Shoulder range of motion is full bilaterally without any sign of impingement. There is no atrophy of the hands intrinsically. Sensation intact bilaterally. Myofascial tenderness noted to left levator scapulae and trapezius regions upon palpation. Negative Hoffman's sign. Negative Spurling's sign.       Skin:    General: Skin is warm and dry.     Capillary Refill: Capillary refill takes less than 2 seconds.  Neurological:     General: No focal deficit present.     Mental Status: She is alert and oriented to person, place, and time.  Psychiatric:        Mood and Affect: Mood normal.        Behavior: Behavior normal.     Ortho Exam  Imaging: No results found.  Past Medical/Family/Surgical/Social History: Medications & Allergies reviewed per EMR, new medications updated. Patient Active Problem List   Diagnosis Date Noted   AKI (acute kidney injury) (HCC) 12/04/2022   History of CVA (cerebrovascular accident) 12/03/2022   Expressive aphasia 12/03/2022   Abnormal CT of the chest 11/06/2022   Acute CVA (cerebrovascular accident) (HCC) 11/01/2022   CVA (cerebral vascular accident) (HCC) 10/31/2022   Cerebrovascular accident (CVA) (HCC) 10/22/2022   Age-related osteoporosis  without current pathological fracture 08/27/2022   Osteoporosis 08/21/2022   Vitamin D  deficiency 08/21/2022   Hypercholesterolemia 07/25/2022   Chronic migraine w/o aura w/o status migrainosus, not intractable 01/10/2021   Anxiety 01/10/2021   Chronic migraine 03/03/2020   Chronic neck pain 03/03/2020   BPPV (benign paroxysmal positional vertigo), right 12/14/2019   Acute recurrent pansinusitis 03/10/2018   Arthritis of hand 02/20/2018   Paresthesia 11/24/2017   Neck pain 11/24/2017   Past Medical History:  Diagnosis Date   DDD (degenerative disc disease),  lumbar    GAD (generalized anxiety disorder)    Headache    Hypercholesterolemia    Hyperglycemia    Hyperlipemia    Lumbar radiculopathy    Migraine    Osteoarthritis    Osteoporosis    Paresthesia of foot    right   Sacroiliac joint pain    Sciatica of right side    Scoliosis    Seasonal allergies    Stroke [facility])    TIA (transient ischemic attack)    10/2022 and 11/2022   Family History  Problem Relation Age of Onset   Stroke Mother        age 36   COPD Mother    Other Father        age 51 - complications from surgery   Hyperlipidemia Brother    Heart attack Brother        age 71   Past Surgical History:  Procedure Laterality Date   HAND SURGERY Right    Right middle finger trigger release   OVARIAN CYST REMOVAL Left 1977   Social History   Occupational History   Occupation: Retired  Tobacco Use   Smoking status: Former    Current packs/day: 0.00    Average packs/day: 1 pack/day for 2.0 years (2.0 ttl pk-yrs)    Types: Cigarettes    Start date: 03/02/1977    Quit date: 03/03/1979    Years since quitting: 44.8    Passive exposure: Past   Smokeless tobacco: Never  Vaping Use   Vaping status: Never Used  Substance and Sexual Activity   Alcohol use: Yes    Comment: social   Drug use: Never   Sexual activity: Not on file

## 2024-01-07 NOTE — Progress Notes (Unsigned)
 Pain Scale   Average Pain 10 Patient advised she has Chronic lower back pain radiating to left hip and rear area.        +Driver, -BT, -Dye Allergies.

## 2024-01-08 ENCOUNTER — Telehealth: Payer: Self-pay

## 2024-01-08 NOTE — Telephone Encounter (Signed)
 I called the pt to see if we can set up her follow up appt. Her name was given to me a while back for her to follow up with Dr Avanell Bob while Dr Veryl Gottron was out of the office.... the pt says that since she was last seen 09/2023 she does not want another OV prior to one year 09/2024... she has a recall for Jan 2026 with Dr Veryl Gottron.. she asks to keep it as is and when the time comes she will call to make the appt and then will decide if she wants to see Dr Avanell Bob or Dr Veryl Gottron.

## 2024-01-12 ENCOUNTER — Encounter: Payer: Self-pay | Admitting: Gastroenterology

## 2024-01-13 ENCOUNTER — Encounter: Payer: Self-pay | Admitting: Gastroenterology

## 2024-01-19 ENCOUNTER — Telehealth: Payer: Self-pay | Admitting: Physical Medicine and Rehabilitation

## 2024-01-19 NOTE — Telephone Encounter (Signed)
 Patient called and said she needs to discuss with you about her legs stiffness. GN#[phone]

## 2024-01-20 ENCOUNTER — Other Ambulatory Visit: Payer: Self-pay | Admitting: Physical Medicine and Rehabilitation

## 2024-01-20 DIAGNOSIS — M5412 Radiculopathy, cervical region: Secondary | ICD-10-CM

## 2024-01-20 DIAGNOSIS — G8929 Other chronic pain: Secondary | ICD-10-CM

## 2024-01-20 DIAGNOSIS — M542 Cervicalgia: Secondary | ICD-10-CM

## 2024-01-20 DIAGNOSIS — M7918 Myalgia, other site: Secondary | ICD-10-CM

## 2024-01-22 ENCOUNTER — Ambulatory Visit: Payer: Medicare Other | Admitting: Neurology

## 2024-01-22 DIAGNOSIS — H9202 Otalgia, left ear: Secondary | ICD-10-CM | POA: Diagnosis not present

## 2024-02-05 ENCOUNTER — Other Ambulatory Visit: Payer: Self-pay

## 2024-02-05 ENCOUNTER — Ambulatory Visit: Admitting: Physical Medicine and Rehabilitation

## 2024-02-05 VITALS — BP 121/73 | HR 69

## 2024-02-05 DIAGNOSIS — M47816 Spondylosis without myelopathy or radiculopathy, lumbar region: Secondary | ICD-10-CM | POA: Diagnosis not present

## 2024-02-05 MED ORDER — METHYLPREDNISOLONE ACETATE 40 MG/ML IJ SUSP
40.0000 mg | Freq: Once | INTRAMUSCULAR | Status: AC
  Administered 2024-02-05: 40 mg

## 2024-02-05 NOTE — Progress Notes (Signed)
 Tina Murray - 73 y.o. female MRN [id number]  Date of birth: [birth year]  Office Visit Note: Visit Date: 02/05/2024 PCP: Delayne Artist PARAS, MD Referred by: Delayne Artist PARAS, MD  Subjective: Chief Complaint  Patient presents with   Lower Back - Pain   HPI:  Tina Murray is a 73 y.o. female who comes in todayfor planned repeat radiofrequency ablation of the Left L4-5 and L5-S1  Lumbar facet joints. This would be ablation of the corresponding medial branches and/or dorsal rami.  Patient has had double diagnostic blocks with more than 70% relief.  Subsequent ablation gave them more than 6 months of over 60% relief.  They have had chronic back pain for quite some time, more than 3 months, which has been an ongoing situation with recalcitrant axial back pain.  They have no radicular pain.  Their axial pain is worse with standing and ambulating and on exam today with facet loading.  They have had physical therapy as well as home exercise program.  The imaging noted in the chart below indicated facet pathology. Accordingly they meet all the criteria and qualification for for radiofrequency ablation and we are going to complete this today hopefully for more longer term relief as part of comprehensive management program.   ROS Otherwise per HPI.  Assessment & Plan: Visit Diagnoses:    ICD-10-CM   1. Spondylosis without myelopathy or radiculopathy, lumbar region  M47.816 XR C-ARM NO REPORT    Radiofrequency,Lumbar    methylPREDNISolone  acetate (DEPO-MEDROL ) injection 40 mg      Plan: No additional findings.   Meds & Orders:  Meds ordered this encounter  Medications   methylPREDNISolone  acetate (DEPO-MEDROL ) injection 40 mg    Orders Placed This Encounter  Procedures   Radiofrequency,Lumbar   XR C-ARM NO REPORT    Follow-up: Return if symptoms worsen or fail to improve.   Procedures: No procedures performed  Lumbar Facet Joint Nerve Denervation  Patient: Tina Murray      Date of  Birth: 1951/07/28 MRN: [id number] PCP: Delayne Artist PARAS, MD      Visit Date: 02/05/2024   Universal Protocol:    Date/Time: 06/26/254:32 PM  Consent Given By: the patient  Position: PRONE  Additional Comments: Vital signs were monitored before and after the procedure. Patient was prepped and draped in the usual sterile fashion. The correct patient, procedure, and site was verified.   Injection Procedure Details:   Procedure diagnoses:  1. Spondylosis without myelopathy or radiculopathy, lumbar region      Meds Administered:  Meds ordered this encounter  Medications   methylPREDNISolone  acetate (DEPO-MEDROL ) injection 40 mg     Laterality: Left  Location/Site:  L4-L5, L3 and L4 medial branches and L5-S1, L4 medial branch and L5 dorsal ramus  Needle: 18 ga.,  10mm active tip, RF Cannula  Needle Placement: Along juncture of superior articular process and transverse pocess  Findings:  -Comments:  Procedure Details: For each desired target nerve, the corresponding transverse process (sacral ala for the L5 dorsal rami) was identified and the fluoroscope was positioned to square off the endplates of the corresponding vertebral body to achieve a true AP midline view.  The beam was then obliqued 15 to 20 degrees and caudally tilted 15 to 20 degrees to line up a trajectory along the target nerves. The skin over the target of the junction of superior articulating process and transverse process (sacral ala for the L5 dorsal rami) was infiltrated with 1ml of 1% Lidocaine without  Epinephrine.  The 18 gauge 10mm active tip outer cannula was advanced in trajectory view to the target.  This procedure was repeated for each target nerve.  Then, for all levels, the outer cannula placement was fine-tuned and the position was then confirmed with bi-planar imaging.    Test stimulation was done both at sensory and motor levels to ensure there was no radicular stimulation. The target tissues  were then infiltrated with 1 ml of 1% Lidocaine without Epinephrine. Subsequently, a percutaneous neurotomy was carried out for 90 seconds at 80 degrees Celsius.  After the completion of the lesion, 1 ml of injectate was delivered. It was then repeated for each facet joint nerve mentioned above. Appropriate radiographs were obtained to verify the probe placement during the neurotomy.   Additional Comments:  The patient tolerated the procedure well Dressing: 2 x 2 sterile gauze and Band-Aid    Post-procedure details: Patient was observed during the procedure. Post-procedure instructions were reviewed.  Patient left the clinic in stable condition.      Clinical History: MRI LUMBAR SPINE WITHOUT CONTRAST   TECHNIQUE: Multiplanar, multisequence MR imaging of the lumbar spine was performed. No intravenous contrast was administered.   COMPARISON:  MR lumbar 05/08/2017; X-ray lumbar 10/31/2021.   FINDINGS: Segmentation: The lowest lumbar type non-rib-bearing vertebra is labeled as L5.   Alignment: Mild levoconvex upper lumbar scoliosis dextroconvex lower lumbar scoliosis. 4 mm anterolisthesis at the L5-S1 level with suspicion for a chronic left L5 pars defect. 3 mm degenerative retrolisthesis at L2-3.   Vertebrae: Disc desiccation at all levels between L2-S1 with loss of disc height especially at L2-3 and L4-5. Degenerative endplate findings are noted especially at L2-3 and L4-5, and are increased from prior particularly at L4-5. Facet and perifacet edema on the left at L3-4 and L4-5, and on the right at L5-S1. Suspected chronic deformity at the upper L3 vertebral level likely from an old healed fracture.   Conus medullaris and cauda equina: Conus extends to the L1-2 level. Conus and cauda equina appear normal.   Paraspinal and other soft tissues: T2 hyperintense lesion posteriorly in the right hepatic lobe on image 5 series 14, stable from 2018. 1.1 by 0.9 cm T2 hyperintense  lesion centrally in the right hepatic lobe on image 2 series 14, nonspecific although statistically likely to be benign. Mild chronic fullness of the left renal collecting system and left proximal ureter, mild fluid signal intensity fullness of both collecting systems and proximal ureters, significance uncertain.   Disc levels:   T12-L1: No impingement.  Small central disc protrusion.   L1-2: No impingement.  Mild disc bulge.   L2-3: Mild displacement of the right L2 nerve in the lateral extraforaminal space due to disc osteophyte complex, similar to prior.   L3-4: Mild left foraminal stenosis with mild left and borderline right subarticular lateral recess stenosis and borderline central narrowing of the thecal sac due to disc bulge, intervertebral spurring, and facet arthropathy. Trace right facet joint effusion. Finding similar to prior.   L4-5: Moderate left foraminal stenosis with mild bilateral subarticular lateral recess stenosis and borderline central narrowing of the thecal sac due to disc bulge, intervertebral spurring, and left greater than right facet arthropathy. The left foraminal impingement is mildly worsened from prior. Small synovial cyst posterior to the left facet joint.   L5-S1: Moderate right and borderline left foraminal stenosis with borderline left subarticular lateral recess stenosis due to disc uncovering, disc bulge, and facet arthropathy. Left foraminal findings are  mildly improved compared to prior.   IMPRESSION: 1. Lumbar spondylosis and degenerative disc disease, causing moderate impingement at L4-5 and L5-S1, and mild impingement at L2-3 and L3-4, as detailed above. The most part this is similar to the prior exam, although the left foraminal impingement is mildly worsened at L4-5 and mildly improved at L5-S1 compared to 05/08/2017. 2. Two T2 hyperintense lesions of the right hepatic lobe are noted, one stable from 2018 and the other  potentially obscured on the prior exam due to a saturation band. These are technically nonspecific although statistically likely to be benign. 3. Lumbar scoliosis. 4. Potential chronic left L5 pars defect with 4 mm anterolisthesis at L5-S1. 5. Multilevel substantial degenerative facet arthropathy with associated facet edema.     Electronically Signed   By: Ryan Salvage M.D.   On: 12/10/2021 11:18     Objective:  VS:  HT:    WT:   BMI:     BP:121/73  HR:69bpm  TEMP: ( )  RESP:  Physical Exam Vitals and nursing note reviewed.  Constitutional:      General: She is not in acute distress.    Appearance: Normal appearance. She is not ill-appearing.  HENT:     Head: Normocephalic and atraumatic.     Right Ear: External ear normal.     Left Ear: External ear normal.   Eyes:     Extraocular Movements: Extraocular movements intact.    Cardiovascular:     Rate and Rhythm: Normal rate.     Pulses: Normal pulses.  Pulmonary:     Effort: Pulmonary effort is normal. No respiratory distress.  Abdominal:     General: There is no distension.     Palpations: Abdomen is soft.   Musculoskeletal:        General: Tenderness present.     Cervical back: Neck supple.     Right lower leg: No edema.     Left lower leg: No edema.     Comments: Patient has good distal strength with no pain over the greater trochanters.  No clonus or focal weakness.   Skin:    Findings: No erythema, lesion or rash.   Neurological:     General: No focal deficit present.     Mental Status: She is alert and oriented to person, place, and time.     Sensory: No sensory deficit.     Motor: No weakness or abnormal muscle tone.     Coordination: Coordination normal.   Psychiatric:        Mood and Affect: Mood normal.        Behavior: Behavior normal.      Imaging: No results found.

## 2024-02-05 NOTE — Progress Notes (Signed)
 Pain Scale   Average Pain 10 Patient advising she has chronic lower back pain radiating to her left leg and at times her left foot. Patient advising she does get some relief when using Voltaren cream.         +Driver, -BT, -Dye Allergies.

## 2024-02-05 NOTE — Procedures (Signed)
 Lumbar Facet Joint Nerve Denervation  Patient: Tina Murray      Date of Birth: 08/31/1950 MRN: [id number] PCP: Delayne Artist PARAS, MD      Visit Date: 02/05/2024   Universal Protocol:    Date/Time: 06/26/254:32 PM  Consent Given By: the patient  Position: PRONE  Additional Comments: Vital signs were monitored before and after the procedure. Patient was prepped and draped in the usual sterile fashion. The correct patient, procedure, and site was verified.   Injection Procedure Details:   Procedure diagnoses:  1. Spondylosis without myelopathy or radiculopathy, lumbar region      Meds Administered:  Meds ordered this encounter  Medications   methylPREDNISolone  acetate (DEPO-MEDROL ) injection 40 mg     Laterality: Left  Location/Site:  L4-L5, L3 and L4 medial branches and L5-S1, L4 medial branch and L5 dorsal ramus  Needle: 18 ga.,  10mm active tip, RF Cannula  Needle Placement: Along juncture of superior articular process and transverse pocess  Findings:  -Comments:  Procedure Details: For each desired target nerve, the corresponding transverse process (sacral ala for the L5 dorsal rami) was identified and the fluoroscope was positioned to square off the endplates of the corresponding vertebral body to achieve a true AP midline view.  The beam was then obliqued 15 to 20 degrees and caudally tilted 15 to 20 degrees to line up a trajectory along the target nerves. The skin over the target of the junction of superior articulating process and transverse process (sacral ala for the L5 dorsal rami) was infiltrated with 1ml of 1% Lidocaine without Epinephrine.  The 18 gauge 10mm active tip outer cannula was advanced in trajectory view to the target.  This procedure was repeated for each target nerve.  Then, for all levels, the outer cannula placement was fine-tuned and the position was then confirmed with bi-planar imaging.    Test stimulation was done both at sensory and  motor levels to ensure there was no radicular stimulation. The target tissues were then infiltrated with 1 ml of 1% Lidocaine without Epinephrine. Subsequently, a percutaneous neurotomy was carried out for 90 seconds at 80 degrees Celsius.  After the completion of the lesion, 1 ml of injectate was delivered. It was then repeated for each facet joint nerve mentioned above. Appropriate radiographs were obtained to verify the probe placement during the neurotomy.   Additional Comments:  The patient tolerated the procedure well Dressing: 2 x 2 sterile gauze and Band-Aid    Post-procedure details: Patient was observed during the procedure. Post-procedure instructions were reviewed.  Patient left the clinic in stable condition.

## 2024-02-05 NOTE — Patient Instructions (Signed)

## 2024-02-12 ENCOUNTER — Other Ambulatory Visit: Payer: Self-pay

## 2024-02-12 ENCOUNTER — Ambulatory Visit: Admitting: Physical Medicine and Rehabilitation

## 2024-02-12 VITALS — BP 100/66 | HR 66

## 2024-02-12 DIAGNOSIS — M47816 Spondylosis without myelopathy or radiculopathy, lumbar region: Secondary | ICD-10-CM

## 2024-02-12 MED ORDER — METHYLPREDNISOLONE ACETATE 40 MG/ML IJ SUSP
40.0000 mg | Freq: Once | INTRAMUSCULAR | Status: AC
  Administered 2024-02-12: 40 mg

## 2024-02-12 NOTE — Procedures (Signed)
 Lumbar Facet Joint Nerve Denervation  Patient: Tina Murray      Date of Birth: 1950/10/19 MRN: [id number] PCP: Delayne Artist PARAS, MD      Visit Date: 02/12/2024   Universal Protocol:    Date/Time: 07/03/254:27 PM  Consent Given By: the patient  Position: PRONE  Additional Comments: Vital signs were monitored before and after the procedure. Patient was prepped and draped in the usual sterile fashion. The correct patient, procedure, and site was verified.   Injection Procedure Details:   Procedure diagnoses:  1. Spondylosis without myelopathy or radiculopathy, lumbar region      Meds Administered:  Meds ordered this encounter  Medications   methylPREDNISolone  acetate (DEPO-MEDROL ) injection 40 mg     Laterality: Right  Location/Site:  L4-L5, L3 and L4 medial branches and L5-S1, L4 medial branch and L5 dorsal ramus  Needle: 18 ga.,  10mm active tip, RF Cannula  Needle Placement: Along juncture of superior articular process and transverse pocess  Findings:  -Comments:  Procedure Details: For each desired target nerve, the corresponding transverse process (sacral ala for the L5 dorsal rami) was identified and the fluoroscope was positioned to square off the endplates of the corresponding vertebral body to achieve a true AP midline view.  The beam was then obliqued 15 to 20 degrees and caudally tilted 15 to 20 degrees to line up a trajectory along the target nerves. The skin over the target of the junction of superior articulating process and transverse process (sacral ala for the L5 dorsal rami) was infiltrated with 1ml of 1% Lidocaine without Epinephrine.  The 18 gauge 10mm active tip outer cannula was advanced in trajectory view to the target.  This procedure was repeated for each target nerve.  Then, for all levels, the outer cannula placement was fine-tuned and the position was then confirmed with bi-planar imaging.    Test stimulation was done both at sensory and  motor levels to ensure there was no radicular stimulation. The target tissues were then infiltrated with 1 ml of 1% Lidocaine without Epinephrine. Subsequently, a percutaneous neurotomy was carried out for 90 seconds at 80 degrees Celsius.  After the completion of the lesion, 1 ml of injectate was delivered. It was then repeated for each facet joint nerve mentioned above. Appropriate radiographs were obtained to verify the probe placement during the neurotomy.   Additional Comments:  The patient tolerated the procedure well Dressing: 2 x 2 sterile gauze and Band-Aid    Post-procedure details: Patient was observed during the procedure. Post-procedure instructions were reviewed.  Patient left the clinic in stable condition.

## 2024-02-12 NOTE — Progress Notes (Signed)
 Pain Scale   Average Pain 8 Patient advising she had chronic lower back pain. Patient advising she is her for her 2 RFA.        +Driver, -BT, -Dye Allergies.

## 2024-02-12 NOTE — Progress Notes (Signed)
 Tina Murray - 73 y.o. female MRN [id number]  Date of birth: 1951/05/06  Office Visit Note: Visit Date: 02/12/2024 PCP: Delayne Artist PARAS, MD Referred by: Delayne Artist PARAS, MD  Subjective: Chief Complaint  Patient presents with   Lower Back - Pain   HPI:  Tina Murray is a 73 y.o. female who comes in todayfor planned repeat radiofrequency ablation of the Right L4-5 and L5-S1  Lumbar facet joints. This would be ablation of the corresponding medial branches and/or dorsal rami.  Patient has had double diagnostic blocks with more than 70% relief.  Subsequent ablation gave them more than 6 months of over 60% relief.  They have had chronic back pain for quite some time, more than 3 months, which has been an ongoing situation with recalcitrant axial back pain.  They have no radicular pain.  Their axial pain is worse with standing and ambulating and on exam today with facet loading.  They have had physical therapy as well as home exercise program.  The imaging noted in the chart below indicated facet pathology. Accordingly they meet all the criteria and qualification for for radiofrequency ablation and we are going to complete this today hopefully for more longer term relief as part of comprehensive management program.   ROS Otherwise per HPI.  Assessment & Plan: Visit Diagnoses:    ICD-10-CM   1. Spondylosis without myelopathy or radiculopathy, lumbar region  M47.816 XR C-ARM NO REPORT    Radiofrequency,Lumbar    methylPREDNISolone  acetate (DEPO-MEDROL ) injection 40 mg      Plan: No additional findings.   Meds & Orders:  Meds ordered this encounter  Medications   methylPREDNISolone  acetate (DEPO-MEDROL ) injection 40 mg    Orders Placed This Encounter  Procedures   Radiofrequency,Lumbar   XR C-ARM NO REPORT    Follow-up: Return if symptoms worsen or fail to improve.   Procedures: No procedures performed  Lumbar Facet Joint Nerve Denervation  Patient: Tina Murray      Date of  Birth: 09/26/1950 MRN: [id number] PCP: Delayne Artist PARAS, MD      Visit Date: 02/12/2024   Universal Protocol:    Date/Time: 07/03/254:27 PM  Consent Given By: the patient  Position: PRONE  Additional Comments: Vital signs were monitored before and after the procedure. Patient was prepped and draped in the usual sterile fashion. The correct patient, procedure, and site was verified.   Injection Procedure Details:   Procedure diagnoses:  1. Spondylosis without myelopathy or radiculopathy, lumbar region      Meds Administered:  Meds ordered this encounter  Medications   methylPREDNISolone  acetate (DEPO-MEDROL ) injection 40 mg     Laterality: Right  Location/Site:  L4-L5, L3 and L4 medial branches and L5-S1, L4 medial branch and L5 dorsal ramus  Needle: 18 ga.,  10mm active tip, RF Cannula  Needle Placement: Along juncture of superior articular process and transverse pocess  Findings:  -Comments:  Procedure Details: For each desired target nerve, the corresponding transverse process (sacral ala for the L5 dorsal rami) was identified and the fluoroscope was positioned to square off the endplates of the corresponding vertebral body to achieve a true AP midline view.  The beam was then obliqued 15 to 20 degrees and caudally tilted 15 to 20 degrees to line up a trajectory along the target nerves. The skin over the target of the junction of superior articulating process and transverse process (sacral ala for the L5 dorsal rami) was infiltrated with 1ml of 1% Lidocaine without  Epinephrine.  The 18 gauge 10mm active tip outer cannula was advanced in trajectory view to the target.  This procedure was repeated for each target nerve.  Then, for all levels, the outer cannula placement was fine-tuned and the position was then confirmed with bi-planar imaging.    Test stimulation was done both at sensory and motor levels to ensure there was no radicular stimulation. The target tissues  were then infiltrated with 1 ml of 1% Lidocaine without Epinephrine. Subsequently, a percutaneous neurotomy was carried out for 90 seconds at 80 degrees Celsius.  After the completion of the lesion, 1 ml of injectate was delivered. It was then repeated for each facet joint nerve mentioned above. Appropriate radiographs were obtained to verify the probe placement during the neurotomy.   Additional Comments:  The patient tolerated the procedure well Dressing: 2 x 2 sterile gauze and Band-Aid    Post-procedure details: Patient was observed during the procedure. Post-procedure instructions were reviewed.  Patient left the clinic in stable condition.      Clinical History: MRI LUMBAR SPINE WITHOUT CONTRAST   TECHNIQUE: Multiplanar, multisequence MR imaging of the lumbar spine was performed. No intravenous contrast was administered.   COMPARISON:  MR lumbar 05/08/2017; X-ray lumbar 10/31/2021.   FINDINGS: Segmentation: The lowest lumbar type non-rib-bearing vertebra is labeled as L5.   Alignment: Mild levoconvex upper lumbar scoliosis dextroconvex lower lumbar scoliosis. 4 mm anterolisthesis at the L5-S1 level with suspicion for a chronic left L5 pars defect. 3 mm degenerative retrolisthesis at L2-3.   Vertebrae: Disc desiccation at all levels between L2-S1 with loss of disc height especially at L2-3 and L4-5. Degenerative endplate findings are noted especially at L2-3 and L4-5, and are increased from prior particularly at L4-5. Facet and perifacet edema on the left at L3-4 and L4-5, and on the right at L5-S1. Suspected chronic deformity at the upper L3 vertebral level likely from an old healed fracture.   Conus medullaris and cauda equina: Conus extends to the L1-2 level. Conus and cauda equina appear normal.   Paraspinal and other soft tissues: T2 hyperintense lesion posteriorly in the right hepatic lobe on image 5 series 14, stable from 2018. 1.1 by 0.9 cm T2 hyperintense  lesion centrally in the right hepatic lobe on image 2 series 14, nonspecific although statistically likely to be benign. Mild chronic fullness of the left renal collecting system and left proximal ureter, mild fluid signal intensity fullness of both collecting systems and proximal ureters, significance uncertain.   Disc levels:   T12-L1: No impingement.  Small central disc protrusion.   L1-2: No impingement.  Mild disc bulge.   L2-3: Mild displacement of the right L2 nerve in the lateral extraforaminal space due to disc osteophyte complex, similar to prior.   L3-4: Mild left foraminal stenosis with mild left and borderline right subarticular lateral recess stenosis and borderline central narrowing of the thecal sac due to disc bulge, intervertebral spurring, and facet arthropathy. Trace right facet joint effusion. Finding similar to prior.   L4-5: Moderate left foraminal stenosis with mild bilateral subarticular lateral recess stenosis and borderline central narrowing of the thecal sac due to disc bulge, intervertebral spurring, and left greater than right facet arthropathy. The left foraminal impingement is mildly worsened from prior. Small synovial cyst posterior to the left facet joint.   L5-S1: Moderate right and borderline left foraminal stenosis with borderline left subarticular lateral recess stenosis due to disc uncovering, disc bulge, and facet arthropathy. Left foraminal findings are  mildly improved compared to prior.   IMPRESSION: 1. Lumbar spondylosis and degenerative disc disease, causing moderate impingement at L4-5 and L5-S1, and mild impingement at L2-3 and L3-4, as detailed above. The most part this is similar to the prior exam, although the left foraminal impingement is mildly worsened at L4-5 and mildly improved at L5-S1 compared to 05/08/2017. 2. Two T2 hyperintense lesions of the right hepatic lobe are noted, one stable from 2018 and the other  potentially obscured on the prior exam due to a saturation band. These are technically nonspecific although statistically likely to be benign. 3. Lumbar scoliosis. 4. Potential chronic left L5 pars defect with 4 mm anterolisthesis at L5-S1. 5. Multilevel substantial degenerative facet arthropathy with associated facet edema.     Electronically Signed   By: Ryan Salvage M.D.   On: 12/10/2021 11:18     Objective:  VS:  HT:    WT:   BMI:     BP:100/66  HR:66bpm  TEMP: ( )  RESP:  Physical Exam Vitals and nursing note reviewed.  Constitutional:      General: She is not in acute distress.    Appearance: Normal appearance. She is not ill-appearing.  HENT:     Head: Normocephalic and atraumatic.     Right Ear: External ear normal.     Left Ear: External ear normal.  Eyes:     Extraocular Movements: Extraocular movements intact.  Cardiovascular:     Rate and Rhythm: Normal rate.     Pulses: Normal pulses.  Pulmonary:     Effort: Pulmonary effort is normal. No respiratory distress.  Abdominal:     General: There is no distension.     Palpations: Abdomen is soft.  Musculoskeletal:        General: Tenderness present.     Cervical back: Neck supple.     Right lower leg: No edema.     Left lower leg: No edema.     Comments: Patient has good distal strength with no pain over the greater trochanters.  No clonus or focal weakness.  Skin:    Findings: No erythema, lesion or rash.  Neurological:     General: No focal deficit present.     Mental Status: She is alert and oriented to person, place, and time.     Sensory: No sensory deficit.     Motor: No weakness or abnormal muscle tone.     Coordination: Coordination normal.  Psychiatric:        Mood and Affect: Mood normal.        Behavior: Behavior normal.      Imaging: No results found.

## 2024-02-12 NOTE — Patient Instructions (Signed)

## 2024-02-18 NOTE — Progress Notes (Deleted)
 Office Visit Note  Patient: Tina Murray             Date of Birth: 1951/03/06           MRN: [id number]             PCP: Delayne Artist PARAS, MD Referring: Dayna Motto, DO Visit Date: 03/03/2024   Subjective:  No chief complaint on file.   History of Present Illness: Tina Murray is a 73 y.o. female here for follow up for osteoporosis on Reclast  infusion treatment    Previous HPI 11/20/2022 Tina Murray is a 73 y.o. female here for follow up for osteoporosis on Reclast  infusion treatment restarted with infusion on January 22 did not have major side effects associated.  Unfortunately she sustained a new stroke and March 2 with left temporal parietal infarcts.  The onset of symptoms were sudden development of speaking gibberish for 3 to 4 minutes suddenly that led her to get this evaluation.  However she was then admitted back to the hospital on March 21 with recurrent episode of strokelike activity and imaging finding to punctate infarcts in the right frontal lobe.  She had a 30-day heart monitor that did not show arrhythmia.  Transthoracic echocardiogram without source of embolism.  She was started on dual antiplatelet therapy.   Previous HPI 08/21/22 Tina Murray is a 73 y.o. female here for osteoporosis.  She has previous diagnosis based on low bone density on DEXA scan but is also sustained previous right shoulder fracture also lumbar spine and pelvis fracture from falls.  She has some issue with chronic dysphagia and also severe GERD so cannot tolerate oral bisphosphonate treatment.  She was started on Reclast  with an initial infusion in 2010 and she had a second dose in 2012.  There was some kind of issue she thinks she had some throat tightening raise concern for allergic reaction but never progressed into major complication she has noted similar symptoms whether perceived dyspnea or globus sensation related to her anxiety.  Regardless more recently she was started on Evenity  in March  of last year with Dr. Marquette she received 2 injections of this medication but had significant body and muscle pain following the treatment so did not receive any additional doses. Besides the osteoporosis she also has issues with generalized osteoarthritis affecting multiple areas.  She had a recent radiofrequency ablation procedure with Dr. Eldonna for chronic back pain.  She has had hand surgery multiple procedures with Dr. Ezzie and Dr. Murrell.  She has had conservative treatment with injections with first [facility] joint osteoarthritis and trigger fingers.  She also uses topical diclofenac gel for her fingers which is helpful. She experiences mildly erythematous skin rash on the neck and upper chest without specific trigger and no residual skin discoloration.   04/12/21 DEXA Scan - [facility]  L1-L4 spine                 -2.4 Left femoral neck        -3.3 Right femoral neck      -3.0   No Rheumatology ROS completed.   PMFS History:  Patient Active Problem List   Diagnosis Date Noted   AKI (acute kidney injury) (HCC) 12/04/2022   History of CVA (cerebrovascular accident) 12/03/2022   Expressive aphasia 12/03/2022   Abnormal CT of the chest 11/06/2022   Acute CVA (cerebrovascular accident) (HCC) 11/01/2022   CVA (cerebral vascular accident) (HCC) 10/31/2022   Cerebrovascular accident (CVA) (HCC) 10/22/2022  Age-related osteoporosis without current pathological fracture 08/27/2022   Osteoporosis 08/21/2022   Vitamin D  deficiency 08/21/2022   Hypercholesterolemia 07/25/2022   Chronic migraine w/o aura w/o status migrainosus, not intractable 01/10/2021   Anxiety 01/10/2021   Chronic migraine 03/03/2020   Chronic neck pain 03/03/2020   BPPV (benign paroxysmal positional vertigo), right 12/14/2019   Acute recurrent pansinusitis 03/10/2018   Arthritis of hand 02/20/2018   Paresthesia 11/24/2017   Neck pain 11/24/2017    Past Medical History:  Diagnosis Date   DDD  (degenerative disc disease), lumbar    GAD (generalized anxiety disorder)    Headache    Hypercholesterolemia    Hyperglycemia    Hyperlipemia    Lumbar radiculopathy    Migraine    Osteoarthritis    Osteoporosis    Paresthesia of foot    right   Sacroiliac joint pain    Sciatica of right side    Scoliosis    Seasonal allergies    Stroke [facility])    TIA (transient ischemic attack)    10/2022 and 11/2022    Family History  Problem Relation Age of Onset   Stroke Mother        age 75   COPD Mother    Other Father        age 69 - complications from surgery   Hyperlipidemia Brother    Heart attack Brother        age 23   Past Surgical History:  Procedure Laterality Date   HAND SURGERY Right    Right middle finger trigger release   OVARIAN CYST REMOVAL Left 1977   Social History   Social History Narrative   Lives at home with husband.   Right-handed.   One cup caffeine daily.   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   Physical Exam   Musculoskeletal Exam: ***  CDAI Exam: CDAI Score: -- Patient Global: --; Provider Global: -- Swollen: --; Tender: -- Joint Exam 03/03/2024   No joint exam has been documented for this visit   There is currently no information documented on the homunculus. Go to the Rheumatology activity and complete the homunculus joint exam.  Investigation: No additional findings.  Imaging: XR C-ARM NO REPORT Result Date: 02/12/2024 Please see Notes tab for imaging impression.  XR C-ARM NO REPORT Result Date: 02/05/2024 Please see Notes tab for imaging  impression.   Recent Labs: Lab Results  Component Value Date   WBC 5.7 10/20/2023   HGB 13.9 10/20/2023   PLT 288 10/20/2023   NA 142 10/20/2023   K 4.5 10/20/2023   CL 103 10/20/2023   CO2 23 10/20/2023   GLUCOSE 89 10/20/2023   BUN 17 10/20/2023   CREATININE 1.04 (H) 10/20/2023   BILITOT 0.3 10/20/2023   ALKPHOS 70 10/20/2023   AST 25 10/20/2023   ALT 18 10/20/2023   PROT 6.4 10/20/2023   ALBUMIN 4.6 10/20/2023   CALCIUM  9.7 10/20/2023    Speciality Comments: No specialty comments available.  Procedures:  No procedures performed Allergies: Patient has no known allergies.   Assessment / Plan:     Visit Diagnoses: No diagnosis found.  ***  Orders: No orders  of the defined types were placed in this encounter.  No orders of the defined types were placed in this encounter.    Follow-Up Instructions: No follow-ups on file.   Shelba SHAUNNA Potters, RT  Note - This record has been created using [employer].  Chart creation errors have been sought, but may not always  have been located. Such creation errors do not reflect on  the standard of medical care.

## 2024-03-03 ENCOUNTER — Ambulatory Visit: Payer: Medicare Other | Admitting: Internal Medicine

## 2024-03-03 DIAGNOSIS — I639 Cerebral infarction, unspecified: Secondary | ICD-10-CM

## 2024-03-03 DIAGNOSIS — M19049 Primary osteoarthritis, unspecified hand: Secondary | ICD-10-CM

## 2024-03-03 DIAGNOSIS — M8000XD Age-related osteoporosis with current pathological fracture, unspecified site, subsequent encounter for fracture with routine healing: Secondary | ICD-10-CM

## 2024-03-03 DIAGNOSIS — H8111 Benign paroxysmal vertigo, right ear: Secondary | ICD-10-CM

## 2024-03-03 NOTE — Progress Notes (Signed)
 Office Visit Note  Patient: Tina Murray             Date of Birth: 20-Sep-1950           MRN: [id number]             PCP: Delayne Artist PARAS, MD Referring: Dayna Motto, DO Visit Date: 03/04/2024   Subjective:  Follow-up  Discussed the use of AI scribe software for clinical note transcription with the patient, who gave verbal consent to proceed.  History of Present Illness   Tina Murray is a 73 y.o. female here for follow up for osteoporosis on Reclast  infusion treatment    She has been receiving Reclast  infusions for osteoporosis, with the most recent infusion in March of this year.   She experiences significant arthritis pain, particularly in her neck and back, which has worsened this year. The neck pain radiates from her shoulder blade to her head, causing her neck to feel 'stuck'. She has received injections for her back, which have started to provide some relief. Additionally, she experiences pain in her hands, especially in her thumbs, and has a history of trigger finger surgery, which has improved.  She has a history of a transient ischemic attack (TIA) in 2024, which limits her use of nonsteroidal anti-inflammatory drugs (NSAIDs) due to the risk of bleeding. She currently uses Tylenol  for pain management and uses topical Voltaren for joint pain, which she finds effective and safe given her medication restrictions.  She experiences anxiety and has a history of acid reflux, which limits her ability to take certain supplements like turmeric. She incorporates omega-3 rich foods into her diet and finds ginger root supplements beneficial for inflammation without causing stomach issues.  She recalls an episode during a two-mile walk where she experienced speech difficulties, which she recognized as a TIA. She is cautious about her medication use due to her history of TIA.       Previous HPI 11/20/2022 Tina Murray is a 73 y.o. female here for follow up for osteoporosis on Reclast   infusion treatment restarted with infusion on January 22 did not have major side effects associated.  Unfortunately she sustained a new stroke and March 2 with left temporal parietal infarcts.  The onset of symptoms were sudden development of speaking gibberish for 3 to 4 minutes suddenly that led her to get this evaluation.  However she was then admitted back to the hospital on March 21 with recurrent episode of strokelike activity and imaging finding to punctate infarcts in the right frontal lobe.  She had a 30-day heart monitor that did not show arrhythmia.  Transthoracic echocardiogram without source of embolism.  She was started on dual antiplatelet therapy.   Previous HPI 08/21/22 Tina Murray is a 73 y.o. female here for osteoporosis.  She has previous diagnosis based on low bone density on DEXA scan but is also sustained previous right shoulder fracture also lumbar spine and pelvis fracture from falls.  She has some issue with chronic dysphagia and also severe GERD so cannot tolerate oral bisphosphonate treatment.  She was started on Reclast  with an initial infusion in 2010 and she had a second dose in 2012.  There was some kind of issue she thinks she had some throat tightening raise concern for allergic reaction but never progressed into major complication she has noted similar symptoms whether perceived dyspnea or globus sensation related to her anxiety.  Regardless more recently she was started on Evenity  in March of last year  with Dr. Marquette she received 2 injections of this medication but had significant body and muscle pain following the treatment so did not receive any additional doses. Besides the osteoporosis she also has issues with generalized osteoarthritis affecting multiple areas.  She had a recent radiofrequency ablation procedure with Dr. Eldonna for chronic back pain.  She has had hand surgery multiple procedures with Dr. Ezzie and Dr. Murrell.  She has had conservative treatment with  injections with first [facility] joint osteoarthritis and trigger fingers.  She also uses topical diclofenac gel for her fingers which is helpful. She experiences mildly erythematous skin rash on the neck and upper chest without specific trigger and no residual skin discoloration.   04/12/21 DEXA Scan - [facility] L1-L4 spine                 -2.4 Left femoral neck        -3.3 Right femoral neck      -3.0   Review of Systems  Constitutional:  Negative for fatigue.  HENT:  Negative for mouth sores and mouth dryness.   Eyes:  Positive for dryness.  Respiratory:  Negative for shortness of breath.   Cardiovascular:  Negative for chest pain and palpitations.  Gastrointestinal:  Negative for blood in stool, constipation and diarrhea.  Endocrine: Negative for increased urination.  Genitourinary:  Negative for involuntary urination.  Musculoskeletal:  Positive for joint pain, joint pain, myalgias, muscle weakness, morning stiffness, muscle tenderness and myalgias. Negative for gait problem and joint swelling.  Skin:  Positive for sensitivity to sunlight. Negative for color change, rash and hair loss.  Allergic/Immunologic: Negative for susceptible to infections.  Neurological:  Positive for headaches. Negative for dizziness.  Hematological:  Negative for swollen glands.  Psychiatric/Behavioral:  Negative for depressed mood and sleep disturbance. The patient is nervous/anxious.     PMFS History:  Patient Active Problem List   Diagnosis Date Noted   AKI (acute kidney injury) (HCC) 12/04/2022   History of CVA (cerebrovascular accident) 12/03/2022   Expressive aphasia 12/03/2022   Abnormal CT of the chest 11/06/2022   Acute CVA (cerebrovascular accident) (HCC) 11/01/2022   CVA (cerebral vascular accident) (HCC) 10/31/2022   Cerebrovascular accident (CVA) (HCC) 10/22/2022   Age-related osteoporosis without current pathological fracture 08/27/2022   Osteoporosis 08/21/2022    Vitamin D  deficiency 08/21/2022   Hypercholesterolemia 07/25/2022   Chronic migraine w/o aura w/o status migrainosus, not intractable 01/10/2021   Anxiety 01/10/2021   Chronic migraine 03/03/2020   Chronic neck pain 03/03/2020   BPPV (benign paroxysmal positional vertigo), right 12/14/2019   Acute recurrent pansinusitis 03/10/2018   Arthritis of hand 02/20/2018   Paresthesia 11/24/2017   Neck pain 11/24/2017    Past Medical History:  Diagnosis Date   DDD (degenerative disc disease), lumbar    GAD (generalized anxiety disorder)    Headache    Hypercholesterolemia    Hyperglycemia    Hyperlipemia    Lumbar radiculopathy    Migraine    Osteoarthritis    Osteoporosis    Paresthesia of foot    right   Sacroiliac joint pain    Sciatica of right side    Scoliosis    Seasonal allergies    Stroke [facility])    TIA (transient ischemic attack)    10/2022 and 11/2022    Family History  Problem Relation Age of Onset   Stroke Mother        age 38   COPD Mother  Other Father        age 30 - complications from surgery   Hyperlipidemia Brother    Heart attack Brother        age 11   Past Surgical History:  Procedure Laterality Date   ABLATION     back   HAND SURGERY Right    Right middle finger trigger release   OVARIAN CYST REMOVAL Left 1977   Social History   Social History Narrative   Lives at home with husband.   Right-handed.   One cup caffeine daily.   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  Cardiovascular:     Rate and Rhythm: Normal rate and regular rhythm.  Pulmonary:     Effort: Pulmonary effort is normal.     Breath sounds: Normal breath sounds.  Skin:    General: Skin is warm and dry.  Neurological:     Mental Status: She is alert.  Psychiatric:        Mood and Affect: Mood normal.      Musculoskeletal Exam:  Shoulders full ROM no tenderness or swelling Elbows full ROM no tenderness or swelling Wrists full ROM no tenderness or swelling Fingers bilateral first CMC joint squaring, PIP and DIP Heberden's nodes both hands No paraspinal tenderness to palpation over upper and lower back Hip normal internal and external rotation without pain, no tenderness to lateral hip palpation Knees full ROM no tenderness or swelling    Investigation: No additional findings.  Imaging: XR C-ARM NO REPORT Result Date: 02/12/2024 Please see Notes tab for imaging impression.   Recent Labs: Lab Results  Component Value Date   WBC 5.7 10/20/2023   HGB 13.9 10/20/2023   PLT 288 10/20/2023   NA 142 10/20/2023   K 4.5 10/20/2023   CL 103 10/20/2023   CO2 23 10/20/2023   GLUCOSE 89 10/20/2023   BUN 17 10/20/2023   CREATININE 1.04 (H) 10/20/2023   BILITOT 0.3 10/20/2023   ALKPHOS 70 10/20/2023   AST 25 10/20/2023   ALT 18 10/20/2023   PROT 6.4 10/20/2023   ALBUMIN 4.6 10/20/2023   CALCIUM  9.7 10/20/2023    Speciality Comments: No specialty comments available.  Procedures:  No procedures performed Allergies: Patient has no known allergies.   Assessment / Plan:     Visit Diagnoses: Age-related osteoporosis without current pathological fracture On Reclast  for two years, with the last infusion in March.  Reports DEXA from earlier this year I have not personally reviewed it to see degree of treatment response or disease progression on her current reclast  regimen.    Cervical  and lumbar spondylosis Severe arthritis-related pain, particularly in the neck, possibly exacerbated by muscle tension. Injections from Dr. Eldonna have provided some relief. - Continue with injections from Dr. Eldonna as needed. - Use topical diclofenac (Voltaren) for pain management to avoid systemic side effects of oral NSAIDs. - Prescription for Flexeril  5 mg  for use at night for pain or related sleep disturbance  Anxiety disorder Reports being full of anxiety due to ongoing issues.  Transient ischemic attack (TIA) Experienced TIA in 2024. Advised to avoid NSAIDs due to increased risk of bleeding and cardiovascular events. Currently on Plavix . - Avoid NSAIDs. - Continue Plavix  to reduce risk of further cardiovascular events.  Gastroesophageal reflux disease (GERD) Experiences acid reflux exacerbated by turmeric supplements. - Avoid turmeric supplements due to adverse effects on stomach.      Orders: No orders of the defined types were placed in this encounter.  Meds ordered this encounter  Medications   cyclobenzaprine  (FLEXERIL ) 5 MG tablet    Sig: Take 1 tablet (5 mg total) by mouth at bedtime as needed for muscle spasms.    Dispense:  30 tablet    Refill:  0     Follow-Up Instructions: Return in about 1 year (around 03/04/2025) for OP on reclast  f/u 68yr.   Lonni LELON Ester, MD  Note - This record has been created using [employer].  Chart creation errors have been sought, but may not always  have been located. Such creation errors do not reflect on  the standard of medical care.

## 2024-03-04 ENCOUNTER — Encounter: Payer: Self-pay | Admitting: Internal Medicine

## 2024-03-04 ENCOUNTER — Ambulatory Visit: Attending: Internal Medicine | Admitting: Internal Medicine

## 2024-03-04 VITALS — BP 96/62 | HR 74 | Resp 14 | Ht 65.0 in | Wt 126.0 lb

## 2024-03-04 DIAGNOSIS — R202 Paresthesia of skin: Secondary | ICD-10-CM | POA: Insufficient documentation

## 2024-03-04 DIAGNOSIS — M19041 Primary osteoarthritis, right hand: Secondary | ICD-10-CM | POA: Diagnosis not present

## 2024-03-04 DIAGNOSIS — I639 Cerebral infarction, unspecified: Secondary | ICD-10-CM | POA: Diagnosis not present

## 2024-03-04 DIAGNOSIS — M810 Age-related osteoporosis without current pathological fracture: Secondary | ICD-10-CM | POA: Diagnosis not present

## 2024-03-04 DIAGNOSIS — M19042 Primary osteoarthritis, left hand: Secondary | ICD-10-CM | POA: Insufficient documentation

## 2024-03-04 MED ORDER — CYCLOBENZAPRINE HCL 5 MG PO TABS: 5.0000 mg | ORAL_TABLET | Freq: Every evening | ORAL | 0 refills | Status: DC | PRN

## 2024-03-12 ENCOUNTER — Telehealth: Payer: Self-pay

## 2024-03-12 ENCOUNTER — Telehealth: Payer: Self-pay | Admitting: Pharmacist

## 2024-03-12 ENCOUNTER — Other Ambulatory Visit (HOSPITAL_COMMUNITY): Payer: Self-pay

## 2024-03-12 DIAGNOSIS — R42 Dizziness and giddiness: Secondary | ICD-10-CM | POA: Diagnosis not present

## 2024-03-12 DIAGNOSIS — R399 Unspecified symptoms and signs involving the genitourinary system: Secondary | ICD-10-CM | POA: Diagnosis not present

## 2024-03-12 NOTE — Telephone Encounter (Signed)
 Submitted a Prior Authorization request to [facility] for Flexeril  via CoverMyMeds. Will update once we receive a response.

## 2024-03-12 NOTE — Telephone Encounter (Signed)
 Pharmacy Patient Advocate Encounter  Received notification from [facility] Medicaid that Prior Authorization for Nurtec 75MG  dispersible tablets has been APPROVED from 02/27/2024 to 08/11/2098   PA #/Case ID/Reference #: [id number]

## 2024-03-12 NOTE — Telephone Encounter (Signed)
 Pharmacy Patient Advocate Encounter   Received notification from Patient Pharmacy that prior authorization for Nurtec 75MG  dispersible tablets is required/requested.   Insurance verification completed.   The patient is insured through [facility] [place] .   Per test claim: PA required; PA submitted to above mentioned insurance via CoverMyMeds Key/confirmation #/EOC [id number] Status is pending

## 2024-03-15 NOTE — Telephone Encounter (Signed)
 Received notification from [facility] regarding a prior authorization for Cyclobenzaprine . Authorization has been APPROVED from 03/04/2024 to until further notice.    Authorization # [id number] Phone # [phone]

## 2024-03-17 ENCOUNTER — Other Ambulatory Visit: Payer: Self-pay

## 2024-03-17 MED ORDER — NURTEC 75 MG PO TBDP: 75.0000 mg | ORAL_TABLET | Freq: Every day | ORAL | 2 refills | Status: DC | PRN

## 2024-03-17 NOTE — Telephone Encounter (Signed)
 Refill request received from [facility] for Nurtec 75 mg.  Nurtec 75 mg script sent.

## 2024-03-26 ENCOUNTER — Ambulatory Visit: Admitting: Gastroenterology

## 2024-03-29 ENCOUNTER — Encounter: Payer: Self-pay | Admitting: Internal Medicine

## 2024-03-30 ENCOUNTER — Ambulatory Visit (INDEPENDENT_AMBULATORY_CARE_PROVIDER_SITE_OTHER)

## 2024-03-30 ENCOUNTER — Ambulatory Visit (INDEPENDENT_AMBULATORY_CARE_PROVIDER_SITE_OTHER): Admitting: Podiatry

## 2024-03-30 DIAGNOSIS — C4441 Basal cell carcinoma of skin of scalp and neck: Secondary | ICD-10-CM | POA: Insufficient documentation

## 2024-03-30 DIAGNOSIS — S9032XA Contusion of left foot, initial encounter: Secondary | ICD-10-CM

## 2024-03-30 DIAGNOSIS — D2370 Other benign neoplasm of skin of unspecified lower limb, including hip: Secondary | ICD-10-CM | POA: Insufficient documentation

## 2024-03-30 DIAGNOSIS — R7303 Prediabetes: Secondary | ICD-10-CM | POA: Insufficient documentation

## 2024-03-30 DIAGNOSIS — M5030 Other cervical disc degeneration, unspecified cervical region: Secondary | ICD-10-CM | POA: Insufficient documentation

## 2024-03-30 DIAGNOSIS — L209 Atopic dermatitis, unspecified: Secondary | ICD-10-CM | POA: Insufficient documentation

## 2024-03-30 DIAGNOSIS — M1990 Unspecified osteoarthritis, unspecified site: Secondary | ICD-10-CM | POA: Insufficient documentation

## 2024-03-30 DIAGNOSIS — R3129 Other microscopic hematuria: Secondary | ICD-10-CM | POA: Insufficient documentation

## 2024-03-30 DIAGNOSIS — Z85828 Personal history of other malignant neoplasm of skin: Secondary | ICD-10-CM | POA: Insufficient documentation

## 2024-03-30 DIAGNOSIS — M84375S Stress fracture, left foot, sequela: Secondary | ICD-10-CM | POA: Diagnosis not present

## 2024-03-30 DIAGNOSIS — D3610 Benign neoplasm of peripheral nerves and autonomic nervous system, unspecified: Secondary | ICD-10-CM | POA: Insufficient documentation

## 2024-03-30 DIAGNOSIS — M419 Scoliosis, unspecified: Secondary | ICD-10-CM | POA: Insufficient documentation

## 2024-03-30 DIAGNOSIS — D225 Melanocytic nevi of trunk: Secondary | ICD-10-CM | POA: Insufficient documentation

## 2024-03-30 DIAGNOSIS — Z1589 Genetic susceptibility to other disease: Secondary | ICD-10-CM | POA: Insufficient documentation

## 2024-03-30 DIAGNOSIS — D1803 Hemangioma of intra-abdominal structures: Secondary | ICD-10-CM | POA: Insufficient documentation

## 2024-03-30 DIAGNOSIS — R202 Paresthesia of skin: Secondary | ICD-10-CM | POA: Insufficient documentation

## 2024-03-30 DIAGNOSIS — L821 Other seborrheic keratosis: Secondary | ICD-10-CM | POA: Insufficient documentation

## 2024-03-30 DIAGNOSIS — F411 Generalized anxiety disorder: Secondary | ICD-10-CM | POA: Insufficient documentation

## 2024-03-30 NOTE — Progress Notes (Signed)
 Subjective:  Patient ID: Tina Murray, female    DOB: [birth year],  MRN: [id number] HPI Chief Complaint  Patient presents with   Foot Injury    2nd toe/dorsal forefoot left - DOI 03/28/24 - door come back and jammed toe and toe went under the door, toe is swollen and red and down into the forefoot as well, not really painful, previous fracture same area   New Patient (Initial Visit)    Est pt 2023    73 y.o. female presents with the above complaint.   ROS: Denies fever chills nausea vomit muscle aches pains calf pain back pain chest pain shortness of breath.  Past Medical History:  Diagnosis Date   DDD (degenerative disc disease), lumbar    GAD (generalized anxiety disorder)    Headache    Hypercholesterolemia    Hyperglycemia    Hyperlipemia    Lumbar radiculopathy    Migraine    Osteoarthritis    Osteoporosis    Paresthesia of foot    right   Sacroiliac joint pain    Sciatica of right side    Scoliosis    Seasonal allergies    Stroke (HCC)    TIA (transient ischemic attack)    10/2022 and 11/2022   Past Surgical History:  Procedure Laterality Date   ABLATION     back   HAND SURGERY Right    Right middle finger trigger release   OVARIAN CYST REMOVAL Left 1977    Current Outpatient Medications:    CALCIUM  PO, Take 1 tablet by mouth daily., Disp: , Rfl:    clopidogrel  (PLAVIX ) 75 MG tablet, Take 75 mg by mouth daily., Disp: , Rfl:    cyclobenzaprine  (FLEXERIL ) 5 MG tablet, Take 1 tablet (5 mg total) by mouth at bedtime as needed for muscle spasms., Disp: 30 tablet, Rfl: 0   fexofenadine (ALLEGRA ALLERGY) 180 MG tablet, Take 180 mg by mouth daily., Disp: , Rfl:    fluocinonide ointment (LIDEX) 0.05 %, 1 application to affected area Externally (NOT FOR FACE OR FOLDS) Twice a day; Duration: 14 days, Disp: , Rfl:    NON FORMULARY, Weem hair vitamin, Disp: , Rfl:    Rimegepant Sulfate (NURTEC) 75 MG TBDP, Take 1 tablet (75 mg total) by mouth daily as needed (take for  abortive therapy of migraine, no more than 1 tablet in 24 hours)., Disp: 16 tablet, Rfl: 2   rosuvastatin  (CRESTOR ) 10 MG tablet, Take 10 mg by mouth daily., Disp: , Rfl:   No Known Allergies Review of Systems Objective:  There were no vitals filed for this visit.  General: Well developed, nourished, in no acute distress, alert and oriented x3   Dermatological: Skin is warm, dry and supple bilateral. Nails x 10 are well maintained; remaining integument appears unremarkable at this time. There are no open sores, no preulcerative lesions, no rash or signs of infection present.  Vascular: Dorsalis Pedis artery and Posterior Tibial artery pedal pulses are 2/4 bilateral with immedate capillary fill time. Pedal hair growth present. No varicosities and no lower extremity edema present bilateral.   Neruologic: Grossly intact via light touch bilateral. Vibratory intact via tuning fork bilateral. Protective threshold with Semmes Wienstein monofilament intact to all pedal sites bilateral. Patellar and Achilles deep tendon reflexes 2+ bilateral. No Babinski or clonus noted bilateral.   Musculoskeletal: No gross boney pedal deformities bilateral. No pain, crepitus, or limitation noted with foot and ankle range of motion bilateral. Muscular strength 5/5 in all groups tested  bilateral.  Swelling and mild erythema overlying the base of the second digit of the left foot.  There does not demonstrate any pain on range of motion there is no ecchymosis.  Gait: Unassisted, Nonantalgic.    Radiographs:  Radiographs taken today demonstrate osseously mature individual with multiple healed fractures to the lesser metatarsals.  Capital osteotomy of the first metatarsal has left to the hallux and a near varus position.  No fracture identified of the second toe.  Assessment & Plan:   Assessment: Contusion second digit left foot cannot rule out stress fracture.  Plan: Placed her in a cam boot.  She will follow-up  with us  in a few weeks at which time a contralateral x-ray will be taken to evaluate her bunion deformity on that side the exostectomy to the lateral hallux on the right side and then we will also consider exostectomy to the fifth digit left foot.     Troi Florendo T. Wright, [place]

## 2024-03-31 ENCOUNTER — Telehealth: Payer: Self-pay | Admitting: *Deleted

## 2024-03-31 NOTE — Telephone Encounter (Signed)
 Patient here at late clinic with Dr. Verta and has questions. Would like a call back.

## 2024-04-02 ENCOUNTER — Ambulatory Visit: Admitting: Gastroenterology

## 2024-04-05 NOTE — Telephone Encounter (Signed)
 I spoke to patient and states she has no concerns at this time just wanted to speak to the nurse from her visit but has gotten her answers.

## 2024-04-15 ENCOUNTER — Inpatient Hospital Stay: Admission: RE | Admit: 2024-04-15 | Source: Ambulatory Visit

## 2024-04-26 DIAGNOSIS — H43813 Vitreous degeneration, bilateral: Secondary | ICD-10-CM | POA: Diagnosis not present

## 2024-04-26 DIAGNOSIS — Z961 Presence of intraocular lens: Secondary | ICD-10-CM | POA: Diagnosis not present

## 2024-04-26 DIAGNOSIS — H04123 Dry eye syndrome of bilateral lacrimal glands: Secondary | ICD-10-CM | POA: Diagnosis not present

## 2024-04-26 DIAGNOSIS — H40013 Open angle with borderline findings, low risk, bilateral: Secondary | ICD-10-CM | POA: Diagnosis not present

## 2024-05-05 ENCOUNTER — Encounter: Payer: Self-pay | Admitting: Gastroenterology

## 2024-05-05 ENCOUNTER — Ambulatory Visit (INDEPENDENT_AMBULATORY_CARE_PROVIDER_SITE_OTHER): Admitting: Gastroenterology

## 2024-05-05 VITALS — BP 100/68 | HR 88 | Ht 65.0 in | Wt 127.6 lb

## 2024-05-05 DIAGNOSIS — R109 Unspecified abdominal pain: Secondary | ICD-10-CM

## 2024-05-05 DIAGNOSIS — R194 Change in bowel habit: Secondary | ICD-10-CM

## 2024-05-05 DIAGNOSIS — D171 Benign lipomatous neoplasm of skin and subcutaneous tissue of trunk: Secondary | ICD-10-CM | POA: Diagnosis not present

## 2024-05-05 DIAGNOSIS — Z1211 Encounter for screening for malignant neoplasm of colon: Secondary | ICD-10-CM

## 2024-05-05 NOTE — Progress Notes (Signed)
 Chief Complaint: Abdominal cramping, change in bowel habits    HPI:     Tina Murray is a 73 y.o. female with a history of CVA 10/2022, TIAs (now on Plavix ), migraines, HLD, GAD, as outlined below, presenting to the Gastroenterology Clinic for evaluation of abdominal cramping and change in bowel habits, and to establish care.  Recent episode of abdominal cramping, burning sensation, diarrhea with mucus-like stools.  Symptoms occurred a few months ago and have since resolved and not recurred.  Otherwise, currently without any GI symptoms.  Separately, feels like there is a bulge in her right abdomen just lateral to her umbilicus.  No associated pain, change in bowel habits, hematochezia, melena.  She first noticed this earlier this year and was evaluated with CT A/P which was unremarkable as below.    She follows with Dr. Craig Shank and considering tummy tuck.  Had negative Cologuard 2 years ago.   - CT A/P in 09/2023 reviewed and notable for normal-appearing GI tract, benign liver cysts vs hemangiomata, and otherwise normal GB, pancreas, spleen.  Reviewed labs from 10/2023: Normal CBC, CMP (creatinine 1.04), A1c 5.8%  Endoscopic History: - 01/27/2002: Colonoscopy [facility]): Normal - 05/01/2011: Colonoscopy ([facility]): Sigmoid diverticulosis, otherwise normal.   Past Medical History:  Diagnosis Date   DDD (degenerative disc disease), lumbar    GAD (generalized anxiety disorder)    Headache    Hypercholesterolemia    Hyperglycemia    Hyperlipemia    Lumbar radiculopathy    Migraine    Osteoarthritis    Osteoporosis    Paresthesia of foot    right   Sacroiliac joint pain    Sciatica of right side    Scoliosis    Seasonal allergies    Stroke [facility])    TIA (transient ischemic attack)    10/2022 and 11/2022     Past Surgical History:  Procedure Laterality Date   ABLATION     back   HAND SURGERY Right    Right middle finger  trigger release   OVARIAN CYST REMOVAL Left 1977   Family History  Problem Relation Age of Onset   Stroke Mother        age 25   COPD Mother    Other Father        age 66 - complications from surgery   Hyperlipidemia Brother    Heart attack Brother        age 20   Social History   Tobacco Use   Smoking status: Former    Current packs/day: 0.00    Average packs/day: 1 pack/day for 2.0 years (2.0 ttl pk-yrs)    Types: Cigarettes    Start date: 03/02/1977    Quit date: 03/03/1979    Years since quitting: 45.2    Passive exposure: Past   Smokeless tobacco: Never  Vaping Use   Vaping status: Never Used  Substance Use Topics   Alcohol use: Never    Comment: social   Drug use: Never   Current Outpatient Medications  Medication Sig Dispense Refill   CALCIUM  PO Take 1 tablet by mouth daily.     clopidogrel  (PLAVIX ) 75 MG tablet Take 75 mg by mouth daily.     fexofenadine (ALLEGRA ALLERGY) 180 MG tablet Take 180 mg by mouth daily.     NON FORMULARY Weem hair vitamin     Rimegepant Sulfate (NURTEC) 75 MG TBDP Take  1 tablet (75 mg total) by mouth daily as needed (take for abortive therapy of migraine, no more than 1 tablet in 24 hours). 16 tablet 2   rosuvastatin  (CRESTOR ) 10 MG tablet Take 10 mg by mouth daily.     No current facility-administered medications for this visit.   No Known Allergies   Review of Systems: All systems reviewed and negative except where noted in HPI.     Physical Exam:    Wt Readings from Last 3 Encounters:  05/05/24 127 lb 9.6 oz (57.9 kg)  03/04/24 126 lb (57.2 kg)  11/11/23 124 lb (56.2 kg)    BP 100/68   Pulse 88   Ht 5' 5 (1.651 m)   Wt 127 lb 9.6 oz (57.9 kg)   BMI 21.23 kg/m  Constitutional:  Pleasant, in no acute distress. Psychiatric: Normal mood and affect. Behavior is normal. Cardiovascular: Normal rate, regular rhythm. No edema Pulmonary/chest: Effort normal and breath sounds normal. No wheezing, rales or  rhonchi. Abdominal: Slight subdermal protrusion just right of her umbilicus.  Area is soft, nontender.  Overlying skin is normal.  Abdomen otherwise soft, nondistended, nontender. Bowel sounds active throughout.  Neurological: Alert and oriented to person place and time. Skin: Skin is warm and dry. No rashes noted.   ASSESSMENT AND PLAN;   1) Colon cancer screening Last colonoscopy was in [place] in 04/2011 and only notable for sigmoid diverticulosis.  Patient reports negative Cologuard 2-3 years ago.  Discussed continued CRC screening options.  She does not want repeat optical colonoscopy unless absolutely necessary. - Order Cologuard per patient request - Patient knows that if Cologuard positive will want to proceed with optical colonoscopy  2) Abdominal lipoma Exam most suspicious for benign lipoma.  CT in February otherwise unremarkable without hernia.  Per patient request, I will message her plastic surgeon, Dr. Elisabeth.     Sandor LULLA Flatter, DO, FACG  05/05/2024, 9:40 AM   Madden, Evan J, MD

## 2024-05-05 NOTE — Patient Instructions (Signed)
 _______________________________________________________  If your blood pressure at your visit was 140/90 or greater, please contact your primary care physician to follow up on this.  _______________________________________________________  If you are age 73 or older, your body mass index should be between 23-30. Your Body mass index is 21.23 kg/m. If this is out of the aforementioned range listed, please consider follow up with your Primary Care Provider.  If you are age 21 or younger, your body mass index should be between 19-25. Your Body mass index is 21.23 kg/m. If this is out of the aformentioned range listed, please consider follow up with your Primary Care Provider.   ________________________________________________________  The [facility] providers would like to encourage you to use MYCHART to communicate with providers for non-urgent requests or questions.  Due to long hold times on the telephone, sending your provider a message by [facility] may be a faster and more efficient way to get a response.  Please allow 48 business hours for a response.  Please remember that this is for non-urgent requests.  _______________________________________________________  [facility] is using a team-based approach to care.  Your team is made up of your doctor and two to three APPS. Our APPS (Nurse Practitioners and Physician Assistants) work with your physician to ensure care continuity for you. They are fully qualified to address your health concerns and develop a treatment plan. They communicate directly with your gastroenterologist to care for you. Seeing the Advanced Practice Practitioners on your physician's team can help you by facilitating care more promptly, often allowing for earlier appointments, access to diagnostic testing, procedures, and other specialty referrals.   Your provider has ordered Cologuard testing as an option for colon cancer screening. This is performed by [employer] and may be out of network with your insurance. PRIOR to completing the test, it is YOUR responsibility to contact your insurance about covered benefits for this test. Your out of pocket expense could be anywhere from $0.00 to $649.00.   When you call to check coverage with your insurer, please provide the following information:   -The ONLY provider of Cologuard is Optician, dispensing  - CPT code for Cologuard is [phone].  [employer] NPI # [id number]  -[employer] Tax ID # [id number]   We have already sent your demographic and insurance information to [employer] (phone number [phone]) and they should contact you within the next week regarding your test. If you have not heard from them within the next week, please call our office at [phone].  Due to recent changes in healthcare laws, you may see the results of your imaging and laboratory studies on MyChart before your provider has had a chance to review them.  We understand that in some cases there may be results that are confusing or concerning to you. Not all laboratory results come back in the same time frame and the provider may be waiting for multiple results in order to interpret others.  Please give us  48 hours in order for your provider to thoroughly review all the results before contacting the office for clarification of your results.   It was a pleasure to see you today!  Tina Murray, D.O.

## 2024-05-11 ENCOUNTER — Ambulatory Visit (INDEPENDENT_AMBULATORY_CARE_PROVIDER_SITE_OTHER)

## 2024-05-11 ENCOUNTER — Ambulatory Visit: Admitting: Podiatry

## 2024-05-11 ENCOUNTER — Encounter: Payer: Self-pay | Admitting: Podiatry

## 2024-05-11 DIAGNOSIS — M84375S Stress fracture, left foot, sequela: Secondary | ICD-10-CM

## 2024-05-11 NOTE — Progress Notes (Signed)
 She presents today for follow-up of a possible stress fracture to the proximal phalanx of the second toe or the second metatarsal.  She states that all in all she is doing quite well however she has all of these deformities that she has placed all these pads around to try to get relief.  Objective: Vitals are stable oriented x 3.  Pulses are palpable.  Patient demonstrates pads to me that appear to be causing her shoe to be very full.  She has HAV deformity of the right foot with overlapping second toes resulting in a painful lesion to the lateral aspect of the interphalangeal joint right hallux.  Left foot demonstrates a prominent second metatarsal head dorsally from a previous proximal fracture with elevation.  She has some mild erythema surrounding the base of the second toe at the proximal phalanx which radiographically does demonstrate some sclerosis today possible stress fracture or fracture of the plantar condyle of this area.  Assessment: Digital deformities bilateral she would like to consider surgical repair of these.  Plan: I provided her with digital silicone pads today.  She is going to follow-up with Dr. Silva for possible surgical reconstruction [place] plasty and/or digital exostectomies and hammertoe repairs.

## 2024-05-13 DIAGNOSIS — Z1211 Encounter for screening for malignant neoplasm of colon: Secondary | ICD-10-CM | POA: Diagnosis not present

## 2024-05-17 ENCOUNTER — Ambulatory Visit: Payer: Self-pay | Admitting: Podiatry

## 2024-05-19 ENCOUNTER — Ambulatory Visit: Payer: Self-pay | Admitting: Gastroenterology

## 2024-05-19 DIAGNOSIS — L720 Epidermal cyst: Secondary | ICD-10-CM | POA: Diagnosis not present

## 2024-05-19 DIAGNOSIS — L738 Other specified follicular disorders: Secondary | ICD-10-CM | POA: Diagnosis not present

## 2024-05-19 DIAGNOSIS — D225 Melanocytic nevi of trunk: Secondary | ICD-10-CM | POA: Diagnosis not present

## 2024-05-19 DIAGNOSIS — D2272 Melanocytic nevi of left lower limb, including hip: Secondary | ICD-10-CM | POA: Diagnosis not present

## 2024-05-19 DIAGNOSIS — L578 Other skin changes due to chronic exposure to nonionizing radiation: Secondary | ICD-10-CM | POA: Diagnosis not present

## 2024-05-19 DIAGNOSIS — L658 Other specified nonscarring hair loss: Secondary | ICD-10-CM | POA: Diagnosis not present

## 2024-05-19 DIAGNOSIS — L821 Other seborrheic keratosis: Secondary | ICD-10-CM | POA: Diagnosis not present

## 2024-05-19 LAB — COLOGUARD: COLOGUARD: POSITIVE — AB

## 2024-05-20 ENCOUNTER — Telehealth: Payer: Self-pay | Admitting: Gastroenterology

## 2024-05-20 ENCOUNTER — Ambulatory Visit

## 2024-05-20 ENCOUNTER — Ambulatory Visit: Admitting: Podiatry

## 2024-05-20 ENCOUNTER — Other Ambulatory Visit: Payer: Self-pay

## 2024-05-20 VITALS — Ht 65.0 in | Wt 127.6 lb

## 2024-05-20 DIAGNOSIS — M19071 Primary osteoarthritis, right ankle and foot: Secondary | ICD-10-CM

## 2024-05-20 DIAGNOSIS — R195 Other fecal abnormalities: Secondary | ICD-10-CM

## 2024-05-20 DIAGNOSIS — M21611 Bunion of right foot: Secondary | ICD-10-CM

## 2024-05-20 DIAGNOSIS — M2041 Other hammer toe(s) (acquired), right foot: Secondary | ICD-10-CM

## 2024-05-20 DIAGNOSIS — M21619 Bunion of unspecified foot: Secondary | ICD-10-CM

## 2024-05-20 DIAGNOSIS — M2042 Other hammer toe(s) (acquired), left foot: Secondary | ICD-10-CM

## 2024-05-20 MED ORDER — NA SULFATE-K SULFATE-MG SULF 17.5-3.13-1.6 GM/177ML PO SOLN: 1.0000 | ORAL | 0 refills | Status: DC

## 2024-05-20 NOTE — Progress Notes (Signed)
 Subjective:  Patient ID: Tina Murray, female    DOB: [birth year],  MRN: [id number]  Chief Complaint  Patient presents with   Foot Problem    RM 21 Patient is here for surgical consult. Patient is for bunion of the right foot.    Discussed the use of AI scribe software for clinical note transcription with the patient, who gave verbal consent to proceed.  History of Present Illness Danila Eddie is a 73 year old female who presents with recurrent bunion pain and deformity in the right foot. She was referred by Dr. Verta for evaluation of her recurrent bunion and arthritis.  She underwent bunion surgery approximately 40 years ago on both feet. The right foot has developed a recurrent bunion causing significant pain, particularly when wearing shoes. The pain is described as severe, sometimes intense enough to 'hit the ceiling'. She uses spacers between her toes to alleviate discomfort. No pain is reported when not wearing shoes.  In 2023, she sustained a fracture in her left foot when it was caught behind a door, resulting in bruising and a confirmed fracture.  She typically wears flat shoes and has never worn high heels. Her mother also had severe bunions, suggesting a hereditary component. She mentions that her previous surgeries were not entirely successful, as one bunion grew back.  She is currently on Plavix  following a TIA, which she experienced while exercising. She describes the event as starting to 'gibber' while talking to a friend.  She is also dealing with a recent positive colonoscopy result, requiring further investigation. She expresses concern about managing multiple health issues, including arthritis, a fractured sacrum and pelvis, and a nasal issue following a cosmetic procedure.      Objective:    Physical Exam VASCULAR: DP and PT pulse palpable. Foot is warm and well-perfused. Capillary fill time is brisk. DERMATOLOGIC: Normal skin turgor, texture, and temperature.  No open lesions, rashes, or ulcerations. Painful callus on the lateral hallux rubbing the second toe. NEUROLOGIC: Normal sensation to light touch and pressure. No paresthesias on examination. ORTHOPEDIC: Crepitus on range of motion of the right first MTP joint. Hallux valgus forming on the right with a medial bunion.  No ecchymosis or bruising. No gross deformity. No pain to palpation.   No images are attached to the encounter.    Results RADIOLOGY Right foot X-ray: Hallux valgus deformity recurrent with severe osteoarthritis of the first metatarsophalangeal (MTP) joint and digital contractures (05/20/2024) Left foot X-ray: Well-healed stress fracture with elevation of the second metatarsal and previous digital surgery with recurrence of digital contracture of the fifth toe   Assessment:   1. Bunion   2. Hammer toe of right foot   3. Hammertoe of left foot   4. Arthritis of first metatarsophalangeal (MTP) joint of right foot      Plan:  Patient was evaluated and treated and all questions answered.  Assessment and Plan Assessment & Plan Right foot hallux valgus with severe first MTP osteoarthritis Recurrent hallux valgus deformity on the right foot with severe osteoarthritis of the first metatarsophalangeal (MTP) joint. Pain primarily occurs when wearing shoes. X-rays confirm arthritis and bunion recurrence. Advanced arthritis makes fusion the recommended option for long-term relief.  Surgical we discussed MTP fusion versus osteotomy.  I discussed with her the problem that osteotomy would likely be advanced arthritis continued to develop over time and she will need fusion again in the future anyway.  We discussed the risk benefits and limitations of MTP fusion.  I also discussed all risk benefits and potential complications that could arise from surgery as well as  pain, swelling, infection, scar, numbness which may be temporary or permanent, chronic pain, stiffness, nerve pain or damage,  wound healing problems, bone healing problems including delayed or non-union.  We also discussed partial ostectomy of the second toe if necessary if there is still in space between the toes. she understands wished to proceed.  Informed consent signed and reviewed.   Left foot digital contracture, status post prior surgery, with recurrence Recurrence of digital contracture on the left foot following previous surgery. Significant pain occurs, particularly when wearing shoes. - Discussed further treatment of this.  Nonoperative treatment has been unsuccessful.  We discussed further arthroplasty that may destabilize the toe.  Discussed if this is done then I would recommend partial syndactyly to the fourth webspace to anchor the toe.  All questions addressed in regards to this procedure.  Will perform this at the same time as MTP fusion of the right foot.  She will be weightbearing as tolerated in surgical shoe on this.       Surgical plan:  Procedure: - Right first MTP fusion, possible ostectomy of the second toe medially, left fifth toe arthroplasty with partial syndactyly  Location: - [facility]  Anesthesia plan: - Sedation with local  Postoperative pain plan: - Tylenol  1000 mg every 6 hours, oxycodone 5 mg 1-2 tabs every 6 hours only as needed  DVT prophylaxis: - She will restart Plavix  postop  WB Restrictions / DME needs: - Nonweightbearing on right foot in boot with knee scooter, weightbearing as tolerated in left foot and surgical shoe   No follow-ups on file.

## 2024-05-20 NOTE — Telephone Encounter (Signed)
 Patient stating call dropped, returning call  Requesting a call back  Please advise  Thank you

## 2024-05-20 NOTE — Telephone Encounter (Signed)
 Returned call to the patient.  Questions and concerns answered to the best of my ability.

## 2024-05-21 NOTE — Progress Notes (Signed)
 NEUROLOGY FOLLOW UP OFFICE NOTE  Tina Murray [id number]  Assessment/Plan:   Acute headache likely sinus-related vs tension-type Migraine without aura, without status migrainosus, not intractable - stable Ocular migraine History of transient ischemic attack   Migraine prevention:  Deferred Migraine rescue:  Nurtec PRN Secondary stroke prevention as per PCP: Plavix  75mg  daily.  From my standpoint, while we discussed that discontinue antiplatelet therapy may increase risk for recurrent TIA vs stroke, she is at a lower risk at this amount of time out from the TIA and benefits outweigh those risks.   Statin.  LDL goal less than 70 Normotensive blood pressure Hgb A1c goal less than 7 Limit use of pain relievers to no more than 9 days out of the month to prevent risk of rebound or medication-overuse headache. Lifestyle modification: Diet Exercise Sleep hygiene Keep headache diary Follow up 6 months.  Total time spent in chart and face to face with patient:  33 minutes.  Subjective:  Tina Murray is a 73 year old right-handed female with GAD, DDD of lumbar spine, and history of TIAs who follows up for migraines.  UPDATE: Plan was to start Aimovig  but she decided to not start a preventative CGRP inhibitor for the time being.   She was doing well.  Migraines infrequent and respond to Nurtec. Over the last 10 days, she has had increased pressure in the head with nasal congestion and occasional paroxysmal stabbing pain, not unlike what she has previously experienced with sinus headaches.  Sudafed helps.  She also has been under a lot more stress.  She needs a colonoscopy and she also has arthritis, bunion and hammertoe in the left foot that is painful and requires surgery.  She is concerned about discontinuing Plavix  for 5 days for these procedures due to risk of stroke.  However, it can be arranged that she can have the colonoscopy and foot surgery around the same time so she  doesn't have to stop the Plavix  at a separate date.    Pressure in head - congestion took sudafed However, she has had left paroxysmal shooting pain temple -   Current NSAIDS/analgesics:  none Current triptans:  none Current ergotamine:  none Current anti-emetic:  none Current muscle relaxants:  none Current Antihypertensive medications:  none Current Antidepressant medications:  none Current Anticonvulsant medications:  none Current anti-CGRP:  Nurtec daily PRN Current Vitamins/Herbal/Supplements:  none Current Antihistamines/Decongestants:  Allegra Other therapy:  none Other medications:  Plavix  75mg  daily, rosuvastatin  10mg    Caffeine:  1 cup coffee in morning.   Exercise:  walks 2 miles at least 4 times a week Depression:  no; Anxiety:  yes Sleep hygiene:  sleeps well.    HISTORY: Migraines: Onset:  27s years old Location:  holocephalic Quality:  pressure Intensity:  10/10.   Aura:  absent Prodrome:  absent Associated symptoms:  nausea, photophobia, phonophobia,bilateral otalgia, neck pain, blurred vision.  She denies associated vomiting, osmophobia, unilateral numbness or weakness. Duration:  few hours with Nurtec, otherwise all day without treatment Frequency:  6-8 a month Triggers:  unknown Relieving factors:  Nurtec Activity:  aggravates Often wakes up with migraine in the morning.  Ocular migraine: Since her 30s.  Spinning colors in her vision for 5 minutes.  No headache.  Infrequent.    HIstory of CVA/TIAs: In March 2024, she had 2 separate episodes about 3 weeks apart of speaking gibberish for 3-4 minutes.  MRI of brain on 10/18/2022 revealed small punctate left temporoparietal white matter infarct  with underlying moderate chronic small vessel ischemic changes.  Repeat MRI of brain, following second event, showed 2 punctate infarcts in the right medial frontal region, felt to be embolic.  She had a third similar event in April 2024.  MRI of brain on 12/03/2022 was  negative for acute infarct.  30 day cardiac event monitor was negative for afib.  Declined loop recorder.  2D echo showed normal EF of 55-60% without cardiac source of emboli.  Lower extremity venous doppler was negative.  LDL 74 and Hgb A1c was 5.8.  She was already on ASA and Plavix  prior to third event and was switched to ASA and Brilinta  for 4 weeks, followed by Plavix  alone.  Enroses mild short-term memory problems.  Underwent speech therapy and cognitive evaluation which did not demonstrate significant language disorder.    Past medications: Past NSAIDS/analgesics:  naproxen, acetaminophen , ibuprofen, ASA, celecoxib  Past abortive triptans:  sumatriptan  tab Past abortive ergotamine:  none Past muscle relaxants:  tizanidine  Past anti-emetic:  Zofran  ODT 4mg  Past antihypertensive medications:  none Past antidepressant medications:  amitriptyline Past anticonvulsant medications:  gabapentin  Past anti-CGRP:  Ubrelvy  50mg  Past vitamins/Herbal/Supplements:  none Past antihistamines/decongestants:  loratadine  Other past therapies:  none  Family history of headache:  grandmother Family history:  Mother had strokes, first one around age 37 - smoker, poor diet.  PAST MEDICAL HISTORY: Past Medical History:  Diagnosis Date   DDD (degenerative disc disease), lumbar    GAD (generalized anxiety disorder)    Headache    Hypercholesterolemia    Hyperglycemia    Hyperlipemia    Lumbar radiculopathy    Migraine    Osteoarthritis    Osteoporosis    Paresthesia of foot    right   Sacroiliac joint pain    Sciatica of right side    Scoliosis    Seasonal allergies    Stroke [facility])    TIA (transient ischemic attack)    10/2022 and 11/2022    MEDICATIONS: Current Outpatient Medications on File Prior to Visit  Medication Sig Dispense Refill   CALCIUM  PO Take 1 tablet by mouth daily.     clopidogrel  (PLAVIX ) 75 MG tablet Take 75 mg by mouth daily.     fexofenadine (ALLEGRA ALLERGY) 180 MG  tablet Take 180 mg by mouth daily.     Na Sulfate-K Sulfate-Mg Sulfate concentrate (SUPREP BOWEL PREP KIT) 17.5-3.13-1.6 GM/177ML SOLN Take 1 kit (354 mLs total) by mouth as directed. 324 mL 0   NON FORMULARY Weem hair vitamin     Rimegepant Sulfate (NURTEC) 75 MG TBDP Take 1 tablet (75 mg total) by mouth daily as needed (take for abortive therapy of migraine, no more than 1 tablet in 24 hours). 16 tablet 2   rosuvastatin  (CRESTOR ) 10 MG tablet Take 10 mg by mouth daily.     No current facility-administered medications on file prior to visit.    ALLERGIES: No Known Allergies  FAMILY HISTORY: Family History  Problem Relation Age of Onset   Stroke Mother        age 25   COPD Mother    Other Father        age 1 - complications from surgery   Hyperlipidemia Brother    Heart attack Brother        age 76      Objective:  Blood pressure 122/78, pulse 93. General: No acute distress.  Patient appears well-groomed.      Juliene Dunnings, DO  CC: Artist Cohens, MD

## 2024-05-24 ENCOUNTER — Telehealth: Payer: Self-pay | Admitting: Podiatry

## 2024-05-24 ENCOUNTER — Telehealth: Payer: Self-pay | Admitting: Gastroenterology

## 2024-05-24 ENCOUNTER — Encounter: Admitting: Gastroenterology

## 2024-05-24 NOTE — Telephone Encounter (Signed)
 Inbound call from patient requesting to speak with Nat regarding questions she has regarding 10/21 colonoscopy. States she also has questions regarding Plavix . States she is also concerned about coming in at 12:30 due to having migraines and not being able to eat. Please advise, thank you

## 2024-05-24 NOTE — Telephone Encounter (Signed)
 Patient contacted office to schedule surgery. She is wanting to know the expected healing time before setting a date. Please advise, thanks!

## 2024-05-24 NOTE — Telephone Encounter (Signed)
 Patient advised that she has been given clearance to hold Plavix  5 days prior to colonoscopy scheduled for 06-01-24.  Patient advised to take last dose of Plavix  on 05-26-24, and she will be advised when to restart Plavix  by Dr San after the procedure.  Patient agreed to plan and verbalized understanding.  No further questions.   Patient was concerned about having a migraine on the morning of the procedure.  She was advised she could take Nurtec until 3 hours prior to arrival on the morning of the procedure if needed. Patient agreed to plan and verbalized understanding.  No further questions.

## 2024-05-25 ENCOUNTER — Ambulatory Visit (INDEPENDENT_AMBULATORY_CARE_PROVIDER_SITE_OTHER): Admitting: Neurology

## 2024-05-25 ENCOUNTER — Encounter: Payer: Self-pay | Admitting: Neurology

## 2024-05-25 ENCOUNTER — Telehealth: Payer: Self-pay | Admitting: Podiatry

## 2024-05-25 VITALS — BP 122/78 | HR 93

## 2024-05-25 DIAGNOSIS — G43009 Migraine without aura, not intractable, without status migrainosus: Secondary | ICD-10-CM | POA: Diagnosis not present

## 2024-05-25 DIAGNOSIS — Z8673 Personal history of transient ischemic attack (TIA), and cerebral infarction without residual deficits: Secondary | ICD-10-CM

## 2024-05-25 NOTE — Telephone Encounter (Signed)
 Called and spoke with patient and surgery is set for 07/19/2024. Patient is on plavix  and is aware she will need to stop taking prior to surgery but is wanting to know what she can take instead as she states she does not tolerate ibuprofen or other pain relief medications well. She also has questions in regards to the anesthesia as she does not tolerate that well either. Please advise, thanks!

## 2024-06-01 ENCOUNTER — Encounter: Payer: Self-pay | Admitting: Gastroenterology

## 2024-06-01 ENCOUNTER — Ambulatory Visit: Admitting: Gastroenterology

## 2024-06-01 VITALS — BP 111/77 | HR 74 | Temp 97.3°F | Resp 16 | Ht 65.0 in | Wt 127.0 lb

## 2024-06-01 DIAGNOSIS — K562 Volvulus: Secondary | ICD-10-CM

## 2024-06-01 DIAGNOSIS — D123 Benign neoplasm of transverse colon: Secondary | ICD-10-CM

## 2024-06-01 DIAGNOSIS — F411 Generalized anxiety disorder: Secondary | ICD-10-CM | POA: Diagnosis not present

## 2024-06-01 DIAGNOSIS — E7800 Pure hypercholesterolemia, unspecified: Secondary | ICD-10-CM | POA: Diagnosis not present

## 2024-06-01 DIAGNOSIS — D120 Benign neoplasm of cecum: Secondary | ICD-10-CM | POA: Diagnosis not present

## 2024-06-01 DIAGNOSIS — K5730 Diverticulosis of large intestine without perforation or abscess without bleeding: Secondary | ICD-10-CM | POA: Diagnosis not present

## 2024-06-01 DIAGNOSIS — Z1211 Encounter for screening for malignant neoplasm of colon: Secondary | ICD-10-CM | POA: Diagnosis not present

## 2024-06-01 DIAGNOSIS — R195 Other fecal abnormalities: Secondary | ICD-10-CM

## 2024-06-01 MED ORDER — SODIUM CHLORIDE 0.9 % IV SOLN: 500.0000 mL | INTRAVENOUS | Status: DC

## 2024-06-01 NOTE — Op Note (Signed)
 [facility] Patient Name: Tina Murray Procedure Date: 06/01/2024 1:22 PM MRN: [id number] Endoscopist: Sandor Flatter , MD, [id number] Age: 73 Referring MD:  Date of Birth: 01-29-1951 Gender: Female Account #: [id number] Procedure:                Colonoscopy Indications:              Positive Cologuard test Medicines:                Monitored Anesthesia Care Procedure:                Pre-Anesthesia Assessment:                           - Prior to the procedure, a History and Physical                            was performed, and patient medications and                            allergies were reviewed. The patient's tolerance of                            previous anesthesia was also reviewed. The risks                            and benefits of the procedure and the sedation                            options and risks were discussed with the patient.                            All questions were answered, and informed consent                            was obtained. Prior Anticoagulants: The patient has                            taken Plavix  (clopidogrel ), last dose was 5 days                            prior to procedure. ASA Grade Assessment: III - A                            patient with severe systemic disease. After                            reviewing the risks and benefits, the patient was                            deemed in satisfactory condition to undergo the                            procedure.  After obtaining informed consent, the colonoscope                            was passed under direct vision. Throughout the                            procedure, the patient's blood pressure, pulse, and                            oxygen saturations were monitored continuously. The                            CF [id number] #[id number] was introduced through the anus                            and advanced to the the cecum, identified by                             appendiceal orifice and ileocecal valve. The                            colonoscopy was technically difficult and complex                            due to significant looping. Successful completion                            of the procedure was aided by withdrawing the scope                            and replacing with the UltraSlim scope. The patient                            tolerated the procedure well. The quality of the                            bowel preparation was good. The ileocecal valve,                            appendiceal orifice, and rectum were photographed. Scope In: 1:58:18 PM Scope Out: 2:19:09 PM Scope Withdrawal Time: 0 hours 15 minutes 13 seconds  Total Procedure Duration: 0 hours 20 minutes 51 seconds  Findings:                 The perianal and digital rectal examinations were                            normal.                           The sigmoid colon revealed grossly excessive                            looping, retricted mobility and acute angulation.  Could not traverse with the standard colonoscope.                            Advancing the scope required withdrawing the scope                            and replacing with the ultraslim colonoscope.                           Multiple medium-mouthed and small-mouthed                            diverticula were found in the sigmoid colon,                            transverse colon and ascending colon.                           Two sessile polyps were found in the transverse                            colon and cecum. The polyps were 4 to 8 mm in size.                            These polyps were removed with a cold snare.                            Resection and retrieval were complete. Estimated                            blood loss was minimal.                           The retroflexed view of the distal rectum and anal                            verge was  normal and showed no anal or rectal                            abnormalities. Complications:            No immediate complications. Estimated Blood Loss:     Estimated blood loss was minimal. Impression:               - There was significant looping of the colon with                            restricted mobility and acute angulation in the                            sigmoid colon.                           - Diverticulosis in the sigmoid colon, in the  transverse colon and in the ascending colon.                           - Two 4 to 8 mm polyps in the transverse colon and                            in the cecum, removed with a cold snare. Resected                            and retrieved.                           - The distal rectum and anal verge are normal on                            retroflexion view. Recommendation:           - Patient has a contact number available for                            emergencies. The signs and symptoms of potential                            delayed complications were discussed with the                            patient. Return to normal activities tomorrow.                            Written discharge instructions were provided to the                            patient.                           - Resume previous diet.                           - Resume Plavix  (clopidogrel ) at prior dose                            tomorrow.                           - Await pathology results.                           - Repeat colonoscopy for surveillance based on                            pathology results.                           - Return to GI clinic PRN. Sandor Flatter, MD 06/01/2024 2:27:35 PM

## 2024-06-01 NOTE — Telephone Encounter (Signed)
 Patient requesting f/u call in regards to today's procedure. Please advise.

## 2024-06-01 NOTE — Progress Notes (Signed)
 GASTROENTEROLOGY PROCEDURE H&P NOTE   Primary Care Physician: Delayne Artist PARAS, MD    Reason for Procedure:   Cologuard +   Plan:    Colonoscopy  Patient is appropriate for endoscopic procedure(s) in the ambulatory ([facility]) setting.  The nature of the procedure, as well as the risks, benefits, and alternatives were carefully and thoroughly reviewed with the patient. Ample time for discussion and questions allowed. The patient understood, was satisfied, and agreed to proceed.     HPI: Tina Murray is a 73 y.o. female who presents for colonoscopy for evaluation of +Cologuard .  Patient was most recently seen in the Gastroenterology Clinic on 05/05/2024.  No interval change in medical history since that appointment. Please refer to that note for full details regarding GI history and clinical presentation.   Past Medical History:  Diagnosis Date   DDD (degenerative disc disease), lumbar    GAD (generalized anxiety disorder)    Headache    Hypercholesterolemia    Hyperglycemia    Hyperlipemia    Lumbar radiculopathy    Migraine    Osteoarthritis    Osteoporosis    Paresthesia of foot    right   Sacroiliac joint pain    Sciatica of right side    Scoliosis    Seasonal allergies    Stroke [facility])    TIA (transient ischemic attack)    10/2022 and 11/2022    Past Surgical History:  Procedure Laterality Date   ABLATION     back   HAND SURGERY Right    Right middle finger trigger release   OVARIAN CYST REMOVAL Left 1977    Prior to Admission medications   Medication Sig Start Date End Date Taking? Authorizing Provider  CALCIUM  PO Take 1 tablet by mouth daily.   Yes [provider]  fexofenadine (ALLEGRA ALLERGY) 180 MG tablet Take 180 mg by mouth daily.   Yes [provider]  Rimegepant Sulfate (NURTEC) 75 MG TBDP Take 1 tablet (75 mg total) by mouth daily as needed (take for abortive therapy of migraine, no more than 1 tablet in 24 hours). 03/17/24  Yes Jaffe,  Adam R, DO  rosuvastatin  (CRESTOR ) 10 MG tablet Take 10 mg by mouth daily.   Yes [provider]  clopidogrel  (PLAVIX ) 75 MG tablet Take 75 mg by mouth daily.    [provider]  NON FORMULARY Weem hair vitamin    [provider]    Current Outpatient Medications  Medication Sig Dispense Refill   CALCIUM  PO Take 1 tablet by mouth daily.     fexofenadine (ALLEGRA ALLERGY) 180 MG tablet Take 180 mg by mouth daily.     Rimegepant Sulfate (NURTEC) 75 MG TBDP Take 1 tablet (75 mg total) by mouth daily as needed (take for abortive therapy of migraine, no more than 1 tablet in 24 hours). 16 tablet 2   rosuvastatin  (CRESTOR ) 10 MG tablet Take 10 mg by mouth daily.     clopidogrel  (PLAVIX ) 75 MG tablet Take 75 mg by mouth daily.     NON FORMULARY Weem hair vitamin     Current Facility-Administered Medications  Medication Dose Route Frequency Provider Last Rate Last Admin   0.9 %  sodium chloride  infusion  500 mL Intravenous Continuous Dilana Mcphie V, DO        Allergies as of 06/01/2024   (No Known Allergies)    Family History  Problem Relation Age of Onset   Stroke Mother  age 41   COPD Mother    Other Father        age 2 - complications from surgery   Hyperlipidemia Brother    Heart attack Brother        age 79   Colon cancer Neg Hx    Stomach cancer Neg Hx    Rectal cancer Neg Hx    Esophageal cancer Neg Hx     Social History   Socioeconomic History   Marital status: Married    Spouse name: Not on file   Number of children: 0   Years of education: 14   Highest education level: Associate degree: occupational, Scientist, product/process development, or vocational program  Occupational History   Occupation: Retired  Tobacco Use   Smoking status: Former    Current packs/day: 0.00    Average packs/day: 1 pack/day for 2.0 years (2.0 ttl pk-yrs)    Types: Cigarettes    Start date: 03/02/1977    Quit date: 03/03/1979    Years since quitting: 45.2    Passive  exposure: Past   Smokeless tobacco: Never  Vaping Use   Vaping status: Never Used  Substance and Sexual Activity   Alcohol use: Never    Comment: social   Drug use: Never   Sexual activity: Not on file  Other Topics Concern   Not on file  Social History Narrative   Lives at home with husband.   Right-handed.   One cup caffeine daily.   An apartment   retired   Teacher, early years/pre Strain: Not on [employer] Insecurity: No Food Insecurity (12/03/2022)   Hunger Vital Sign    Worried About Running Out of Food in the Last Year: Never true    Ran Out of Food in the Last Year: Never true  Transportation Needs: No Transportation Needs (12/03/2022)   PRAPARE - Administrator, Civil Service (Medical): No    Lack of Transportation (Non-Medical): No  Physical Activity: Not on file  Stress: Not on file  Social Connections: Not on file  Intimate Partner Violence: Not At Risk (12/03/2022)   Humiliation, Afraid, Rape, and Kick questionnaire    Fear of Current or Ex-Partner: No    Emotionally Abused: No    Physically Abused: No    Sexually Abused: No    Physical Exam: Vital signs in last 24 hours: @BP  134/77   Pulse 75   Temp (!) 97.3 F (36.3 C)   Ht 5' 5 (1.651 m)   Wt 127 lb (57.6 kg)   SpO2 100%   BMI 21.13 kg/m  GEN: NAD EYE: Sclerae anicteric ENT: MMM CV: Non-tachycardic Pulm: CTA b/l GI: Soft, NT/ND NEURO:  Alert & Oriented x 3   Sandor Flatter, DO [facility]   06/01/2024 1:45 PM

## 2024-06-01 NOTE — Telephone Encounter (Signed)
Returned pts call and answered all questions.  

## 2024-06-01 NOTE — Patient Instructions (Signed)
 Thank you for letting us  care for your healthcare needs today! Please see handouts regarding Polyps and Diverticulosis. Await pathology results.  - Resume previous diet and medications. Resume Plavix  at prior dose tomorrow (10/22)  YOU HAD AN ENDOSCOPIC PROCEDURE TODAY AT THE [facility]:   Refer to the procedure report that was given to you for any specific questions about what was found during the examination.  If the procedure report does not answer your questions, please call your gastroenterologist to clarify.  If you requested that your care partner not be given the details of your procedure findings, then the procedure report has been included in a sealed envelope for you to review at your convenience later.  YOU SHOULD EXPECT: Some feelings of bloating in the abdomen. Passage of more gas than usual.  Walking can help get rid of the air that was put into your GI tract during the procedure and reduce the bloating. If you had a lower endoscopy (such as a colonoscopy or flexible sigmoidoscopy) you may notice spotting of blood in your stool or on the toilet paper. If you underwent a bowel prep for your procedure, you may not have a normal bowel movement for a few days.  Please Note:  You might notice some irritation and congestion in your nose or some drainage.  This is from the oxygen used during your procedure.  There is no need for concern and it should clear up in a day or so.  SYMPTOMS TO REPORT IMMEDIATELY:  Following lower endoscopy (colonoscopy or flexible sigmoidoscopy):  Excessive amounts of blood in the stool  Significant tenderness or worsening of abdominal pains  Swelling of the abdomen that is new, acute  Fever of 100F or higher  For urgent or emergent issues, a gastroenterologist can be reached at any hour by calling [phone]. Do not use MyChart messaging for urgent concerns.    DIET:  We do recommend a small meal at first, but then you may proceed to your  regular diet.  Drink plenty of fluids but you should avoid alcoholic beverages for 24 hours.  ACTIVITY:  You should plan to take it easy for the rest of today and you should NOT DRIVE or use heavy machinery until tomorrow (because of the sedation medicines used during the test).    FOLLOW UP: Our staff will call the number listed on your records the next business day following your procedure.  We will call around 7:15- 8:00 am to check on you and address any questions or concerns that you may have regarding the information given to you following your procedure. If we do not reach you, we will leave a message.     If any biopsies were taken you will be contacted by phone or by letter within the next 1-3 weeks.  Please call us  at [phone] if you have not heard about the biopsies in 3 weeks.    SIGNATURES/CONFIDENTIALITY: You and/or your care partner have signed paperwork which will be entered into your electronic medical record.  These signatures attest to the fact that that the information above on your After Visit Summary has been reviewed and is understood.  Full responsibility of the confidentiality of this discharge information lies with you and/or your care-partner.

## 2024-06-01 NOTE — Progress Notes (Signed)
 Pt's states no medical or surgical changes since previsit or office visit.

## 2024-06-01 NOTE — Progress Notes (Signed)
 A/o x 3, VSS, good SR's, pleased with anesthesia, report to RN

## 2024-06-01 NOTE — Progress Notes (Signed)
 Called to room to assist during endoscopic procedure.  Patient ID and intended procedure confirmed with present staff. Received instructions for my participation in the procedure from the performing physician.

## 2024-06-02 ENCOUNTER — Telehealth: Payer: Self-pay

## 2024-06-02 NOTE — Telephone Encounter (Signed)
  Follow up Call-     06/01/2024    1:06 PM  Call back number  Post procedure Call Back phone  # [phone]  Permission to leave phone message Yes     Patient questions:  Do you have a fever, pain , or abdominal swelling? No. Pain Score  0 *  Have you tolerated food without any problems? Yes.    Have you been able to return to your normal activities? Yes.    Do you have any questions about your discharge instructions: Diet   No. Medications  No. Follow up visit  No.  Do you have questions or concerns about your Care? No.  Actions: * If pain score is 4 or above: No action needed, pain <4.

## 2024-06-03 DIAGNOSIS — Z23 Encounter for immunization: Secondary | ICD-10-CM | POA: Diagnosis not present

## 2024-06-03 NOTE — Telephone Encounter (Signed)
 Added hammertoe correction for rt 4th toe to consent and added cpt as well to [facility] forms

## 2024-06-07 ENCOUNTER — Encounter: Payer: Self-pay | Admitting: Neurology

## 2024-06-07 ENCOUNTER — Other Ambulatory Visit: Payer: Self-pay | Admitting: Neurology

## 2024-06-07 ENCOUNTER — Telehealth: Payer: Self-pay | Admitting: Neurology

## 2024-06-07 DIAGNOSIS — Z0000 Encounter for general adult medical examination without abnormal findings: Secondary | ICD-10-CM | POA: Diagnosis not present

## 2024-06-07 DIAGNOSIS — Z8673 Personal history of transient ischemic attack (TIA), and cerebral infarction without residual deficits: Secondary | ICD-10-CM | POA: Diagnosis not present

## 2024-06-07 DIAGNOSIS — R3129 Other microscopic hematuria: Secondary | ICD-10-CM | POA: Diagnosis not present

## 2024-06-07 DIAGNOSIS — R7303 Prediabetes: Secondary | ICD-10-CM | POA: Diagnosis not present

## 2024-06-07 DIAGNOSIS — E7800 Pure hypercholesterolemia, unspecified: Secondary | ICD-10-CM | POA: Diagnosis not present

## 2024-06-07 DIAGNOSIS — R350 Frequency of micturition: Secondary | ICD-10-CM | POA: Diagnosis not present

## 2024-06-07 LAB — SURGICAL PATHOLOGY

## 2024-06-07 MED ORDER — PREDNISONE 10 MG PO TABS: ORAL_TABLET | ORAL | 0 refills | Status: AC

## 2024-06-07 NOTE — Telephone Encounter (Signed)
 How frequent or the headaches (on average, how many days a week/month are they occurring)?  Lasting for the three weeks now.  How long do the headaches last?  Per patient she has a sinus headache.  Verify what preventative medication and dose you are taking (e.g. topiramate, propranolol, amitriptyline, Emgality, etc)  NONE Verify which rescue medication you are taking (triptan, Advil, Excedrin, Aleve, Ubrelvy , etc)  Nurtec How often are you taking pain relievers/analgesics/rescue mediction?

## 2024-06-07 NOTE — Telephone Encounter (Signed)
 Pls let her know Dr. Skeet is out of the office. I am not seeing the prednisone mentioned on his note, can she provide more information about this current headache needing further treatment (pls ask headache questions). Thanks

## 2024-06-07 NOTE — Telephone Encounter (Signed)
 Patient states that when she was here the other day to see Dr Skeet he did not want to call her in the prednisone for her sinus headache due to a test she was having done. She had the test and would like to see if he will call it now for her. She uses the [facility] on [address]. Please call

## 2024-06-08 ENCOUNTER — Ambulatory Visit: Admitting: Podiatry

## 2024-06-08 NOTE — Telephone Encounter (Signed)
 Patient advised per Dr.Jaffe, I have sent prednisone taper to the [facility] on [address] and [address]

## 2024-06-14 ENCOUNTER — Encounter: Payer: Self-pay | Admitting: Radiology

## 2024-06-15 ENCOUNTER — Ambulatory Visit: Payer: Self-pay | Admitting: Gastroenterology

## 2024-06-28 ENCOUNTER — Other Ambulatory Visit: Payer: Self-pay

## 2024-07-05 ENCOUNTER — Telehealth: Payer: Self-pay

## 2024-07-05 NOTE — Telephone Encounter (Signed)
 Patient called trying to get some clarification on her surgery. She wants to be sure that the toes she will be having surgery on on her right foot are the big toe, the toe next to the big toe and the toe next to the pinky toe. She wants to know if she will be having surgery on the pinky toe on her left foot as well as the hammertoe on the 4th left toe.

## 2024-07-12 ENCOUNTER — Encounter: Payer: Self-pay | Admitting: Podiatry

## 2024-07-12 ENCOUNTER — Telehealth: Payer: Self-pay

## 2024-07-12 ENCOUNTER — Ambulatory Visit: Admitting: Podiatry

## 2024-07-12 DIAGNOSIS — M2041 Other hammer toe(s) (acquired), right foot: Secondary | ICD-10-CM | POA: Diagnosis not present

## 2024-07-12 DIAGNOSIS — M2042 Other hammer toe(s) (acquired), left foot: Secondary | ICD-10-CM | POA: Diagnosis not present

## 2024-07-12 DIAGNOSIS — M21619 Bunion of unspecified foot: Secondary | ICD-10-CM | POA: Diagnosis not present

## 2024-07-12 NOTE — Telephone Encounter (Signed)
 Responded to patient message left on answering machine on 06/29/24. Patient called to request an appointment with Dr. Silva prior to upcoming surgery on 07/19/24. Patient was transferred to Dr. Darron scheduler ETTER Song at [phone]).

## 2024-07-13 ENCOUNTER — Ambulatory Visit: Admitting: Podiatry

## 2024-07-13 ENCOUNTER — Telehealth: Payer: Self-pay | Admitting: Neurology

## 2024-07-13 ENCOUNTER — Other Ambulatory Visit: Payer: Self-pay | Admitting: Neurology

## 2024-07-13 NOTE — Telephone Encounter (Signed)
 Pt called in this morning and she needs a refill for  Rimegepant Sulfate (NURTEC) 75 MG TBDP. Thanks .

## 2024-07-13 NOTE — Telephone Encounter (Signed)
 Refilled

## 2024-07-13 NOTE — Progress Notes (Signed)
 Subjective:  Patient ID: Tina Murray, female    DOB: 01/04/1951,  MRN: [id number]  Chief Complaint  Patient presents with   Bunions    I'm having surgery on my right and left foot on Monday.  He wanted to see me prior to it.    Discussed the use of AI scribe software for clinical note transcription with the patient, who gave verbal consent to proceed.  History of Present Illness Tina Murray is a 73 year old female who presents with recurrent bunion pain and deformity in the right foot. She was referred by Dr. Verta for evaluation of her recurrent bunion and arthritis.  She returns for follow-up has a few more questions about surgery which is scheduled for next week and is also concerned about issues with the fourth toe on the left foot      Objective:    Physical Exam VASCULAR: DP and PT pulse palpable. Foot is warm and well-perfused. Capillary fill time is brisk. DERMATOLOGIC: Normal skin turgor, texture, and temperature. No open lesions, rashes, or ulcerations. Painful callus on the lateral hallux rubbing the second toe. NEUROLOGIC: Normal sensation to light touch and pressure. No paresthesias on examination. ORTHOPEDIC: Crepitus on range of motion of the right first MTP joint. Hallux valgus forming on the right with a medial bunion.  No ecchymosis or bruising. No gross deformity. No pain to palpation.  Bilaterally she has hammertoe contractures that are semireducible in nature the worst are the adductovarus contracture of the fifth toe bilaterally and the fourth rays are similar reducible   No images are attached to the encounter.    Results RADIOLOGY Right foot X-ray: Hallux valgus deformity recurrent with severe osteoarthritis of the first metatarsophalangeal (MTP) joint and digital contractures (05/20/2024) Left foot X-ray: Well-healed stress fracture with elevation of the second metatarsal and previous digital surgery with recurrence of digital contracture of the  fifth toe   Assessment:   1. Bunion   2. Hammer toe of right foot   3. Hammertoe of left foot      Plan:  Patient was evaluated and treated and all questions answered.  Assessment and Plan Assessment & Plan Right foot hallux valgus with severe first MTP osteoarthritis hammertoe contractures  We again discussed the risk benefits complications that could arise from surgery as well as the proposed procedures previously discussed with right first MTP fusion, possible ostectomy of the medial second toe, left fifth toe arthroplasty and partial syndactyly of this, we have also since that visit added to her consent fourth hammertoe correction of the right foot with PIPJ arthrodesis and wire fixation which we discussed today again, she would also like to have the fourth toe on the left foot corrected as well.  I discussed with her that it is going to be difficult for her to be weightbearing in the right foot in a cam boot and pin fixation for both fourth toes bilaterally so on the left foot I would only recommend flexor tenotomy for the left foot which we have added to her consent and surgical procedures.  She will follow-up me at surgery next week      Surgical plan:  Procedure: - Right first MTP fusion, possible ostectomy of the second toe medially, left fifth toe arthroplasty with partial syndactyly, bilateral fourth hammertoe correction with flexor tenotomy on the left and PIPJ arthrodesis wire fixation on the right  Location: - [facility]  Anesthesia plan: - Sedation with local  Postoperative pain plan: - Tylenol  1000  mg every 6 hours, oxycodone 5 mg 1-2 tabs every 6 hours only as needed  DVT prophylaxis: - She will restart Plavix  postop  WB Restrictions / DME needs: - Nonweightbearing on right foot in boot with knee scooter, weightbearing as tolerated in left foot and surgical shoe   No follow-ups on file.

## 2024-07-19 ENCOUNTER — Other Ambulatory Visit: Payer: Self-pay | Admitting: Podiatry

## 2024-07-19 DIAGNOSIS — M2042 Other hammer toe(s) (acquired), left foot: Secondary | ICD-10-CM | POA: Diagnosis not present

## 2024-07-19 DIAGNOSIS — M19071 Primary osteoarthritis, right ankle and foot: Secondary | ICD-10-CM | POA: Diagnosis not present

## 2024-07-19 DIAGNOSIS — M21612 Bunion of left foot: Secondary | ICD-10-CM | POA: Diagnosis not present

## 2024-07-19 DIAGNOSIS — M21611 Bunion of right foot: Secondary | ICD-10-CM | POA: Diagnosis not present

## 2024-07-19 DIAGNOSIS — M2041 Other hammer toe(s) (acquired), right foot: Secondary | ICD-10-CM | POA: Diagnosis not present

## 2024-07-19 MED ORDER — OXYCODONE HCL 5 MG PO TABS: 5.0000 mg | ORAL_TABLET | ORAL | 0 refills | Status: AC | PRN

## 2024-07-19 MED ORDER — ACETAMINOPHEN 500 MG PO TABS: 1000.0000 mg | ORAL_TABLET | Freq: Four times a day (QID) | ORAL | 0 refills | Status: AC | PRN

## 2024-07-20 ENCOUNTER — Telehealth: Payer: Self-pay | Admitting: Podiatry

## 2024-07-20 NOTE — Telephone Encounter (Signed)
 Started having significant right foot pain around 10:00 PM. Tylenol  has not provided relief. Took  tablet of oxycodone . Patient is concerned and asking whether this level of pain is normal.

## 2024-07-23 ENCOUNTER — Telehealth: Payer: Self-pay | Admitting: Podiatry

## 2024-07-23 NOTE — Telephone Encounter (Signed)
 Patient called with inquiry about an open slit in shoe on left foot, that she is wearing after surgery. She would like to speak with provider regarding slit. [phone]

## 2024-07-26 ENCOUNTER — Encounter: Admitting: Podiatry

## 2024-07-27 ENCOUNTER — Ambulatory Visit

## 2024-07-27 ENCOUNTER — Ambulatory Visit: Admitting: Podiatry

## 2024-07-27 VITALS — Ht 65.0 in | Wt 127.0 lb

## 2024-07-27 DIAGNOSIS — M2041 Other hammer toe(s) (acquired), right foot: Secondary | ICD-10-CM

## 2024-07-27 DIAGNOSIS — M2042 Other hammer toe(s) (acquired), left foot: Secondary | ICD-10-CM | POA: Diagnosis not present

## 2024-07-27 MED ORDER — CEPHALEXIN 500 MG PO CAPS: 500.0000 mg | ORAL_CAPSULE | Freq: Three times a day (TID) | ORAL | 0 refills | Status: AC

## 2024-07-29 NOTE — Progress Notes (Signed)
°  Subjective:  Patient ID: Tina Murray, female    DOB: 12/16/1950,  MRN: [id number]  Chief Complaint  Patient presents with   Post-op Follow-up    RM 1 POV #1 DOS 07/19/2024 RT EXOSTECTOMY, RT HALLUX MPJ FUSION, LT WEBBING PROCEDURE. Surgical sites are healing well, all sutures are intact with moderate swelling in the surgical area.      73 y.o. female returns for post-op check.  Her surgical shoe is broken thinks it was damaged before it was dispensed at the surgery center  Review of Systems: Negative except as noted in the HPI. Denies N/V/F/Ch.   Objective:  There were no vitals filed for this visit. Body mass index is 21.13 kg/m. Constitutional Well developed. Well nourished.  Vascular Foot warm and well perfused. Capillary refill normal to all digits.  Calf is soft and supple, no posterior calf or knee pain, negative Homans' sign  Neurologic Normal speech. Oriented to person, place, and time. Epicritic sensation to light touch grossly present bilaterally.  Dermatologic Skin healing well without signs of infection. Skin edges well coapted without signs of infection.  Orthopedic: Tenderness to palpation noted about the surgical site.   Multiple view plain film radiographs: Good correction noted all hardware intact and equivalent to immediate postoperative films Assessment:   1. Hammer toe of right foot   2. Hammertoe of left foot   3. Arthritis of first metatarsophalangeal (MTP) joint of right foot    Plan:  Patient was evaluated and treated and all questions answered.  S/p foot surgery bilaterally -Progressing as expected post-operatively. -XR: Noted above no complications -WB Status: Weightbearing as tolerated in surgical shoe on left and CAM boot on right -Sutures: Return in 2 weeks to remove. -Medications: No refills required -Foot redressed.  It was necessary to dispense a new surgical shoe to replace the damaged shoe the sole had cracked on the left surgical  shoe  No follow-ups on file.

## 2024-08-09 ENCOUNTER — Ambulatory Visit (INDEPENDENT_AMBULATORY_CARE_PROVIDER_SITE_OTHER): Admitting: Podiatry

## 2024-08-09 VITALS — Ht 65.0 in | Wt 127.0 lb

## 2024-08-09 DIAGNOSIS — M2042 Other hammer toe(s) (acquired), left foot: Secondary | ICD-10-CM

## 2024-08-09 DIAGNOSIS — M2041 Other hammer toe(s) (acquired), right foot: Secondary | ICD-10-CM

## 2024-08-09 DIAGNOSIS — M19071 Primary osteoarthritis, right ankle and foot: Secondary | ICD-10-CM

## 2024-08-09 NOTE — Patient Instructions (Signed)
Look for an "EvenUp" shoe attachment on [employer] or at [employer]. This will level out your hips while you are walking in the CAM boot. Wear this on the other foot around a supportive sneaker:     

## 2024-08-11 NOTE — Progress Notes (Signed)
"  °  Subjective:  Patient ID: Tina Murray, female    DOB: 02-21-1951,  MRN: [id number]  Chief Complaint  Patient presents with   Post-op Follow-up    Rm 24 POV #2 DOS 07/19/2024 RT EXOSTECTOMY, RT HALLUX MPJ FUSION, LT WEBBING PROCEDURE      73 y.o. female returns for post-op check.  Follow-up is doing well  Review of Systems: Negative except as noted in the HPI. Denies N/V/F/Ch.   Objective:  There were no vitals filed for this visit. Body mass index is 21.13 kg/m. Constitutional Well developed. Well nourished.  Vascular Foot warm and well perfused. Capillary refill normal to all digits.  Calf is soft and supple, no posterior calf or knee pain, negative Homans' sign  Neurologic Normal speech. Oriented to person, place, and time. Epicritic sensation to light touch grossly present bilaterally.  Dermatologic Skin healing well without signs of infection. Skin edges well coapted without signs of infection.  Orthopedic: Tenderness to palpation noted about the surgical site.   Multiple view plain film radiographs: Good correction noted all hardware intact and equivalent to immediate postoperative films Assessment:   1. Hammer toe of right foot   2. Hammertoe of left foot   3. Arthritis of first metatarsophalangeal (MTP) joint of right foot     Plan:  Patient was evaluated and treated and all questions answered.  S/p foot surgery bilaterally Sutures removed uneventfully.  Continue using cam boot for weightbearing as tolerated on the right ankle may return to regular shoe gear on the left.  Follow-up in 3 weeks for new radiographs  No follow-ups on file.  "

## 2024-08-13 ENCOUNTER — Encounter: Payer: Self-pay | Admitting: Podiatry

## 2024-08-31 ENCOUNTER — Ambulatory Visit

## 2024-08-31 ENCOUNTER — Ambulatory Visit: Admitting: Podiatry

## 2024-08-31 VITALS — Ht 65.0 in | Wt 127.0 lb

## 2024-08-31 DIAGNOSIS — M21619 Bunion of unspecified foot: Secondary | ICD-10-CM

## 2024-08-31 DIAGNOSIS — M2041 Other hammer toe(s) (acquired), right foot: Secondary | ICD-10-CM | POA: Diagnosis not present

## 2024-08-31 DIAGNOSIS — M2042 Other hammer toe(s) (acquired), left foot: Secondary | ICD-10-CM

## 2024-09-02 ENCOUNTER — Encounter: Payer: Self-pay | Admitting: Podiatry

## 2024-09-02 NOTE — Progress Notes (Signed)
"  °  Subjective:  Patient ID: Tina Murray, female    DOB: 1951-03-22,  MRN: [id number]  Chief Complaint  Patient presents with   Post-op Follow-up    RM 3 POV #3 DOS 07/19/2024 RT EXOSTECTOMY, RT HALLUX MPJ FUSION, LT WEBBING PROCEDURE.Patient states minimum pain,intermittent tenderness in the right great toe and swelling in the right 4th toe. Pt is here to discuss flexor tenotomy of the left 3rd toe.      74 y.o. female returns for post-op check.  She returns for follow-up pain is fairly well-controlled, concerned about the third toe on the left foot curling and  Review of Systems: Negative except as noted in the HPI. Denies N/V/F/Ch.   Objective:  There were no vitals filed for this visit. Body mass index is 21.13 kg/m. Constitutional Well developed. Well nourished.  Vascular Foot warm and well perfused. Capillary refill normal to all digits.  Calf is soft and supple, no posterior calf or knee pain, negative Homans' sign  Neurologic Normal speech. Oriented to person, place, and time. Epicritic sensation to light touch grossly present bilaterally.  Dermatologic Incisions are well-healed and not hypertrophic  Orthopedic: Tenderness to palpation noted about the surgical site.   Multiple view plain film radiographs: Good correction is maintained there is no complication of hardware there is some bridging of the fusion site but still visible lucency in the lateral portions on the lateral MTP joint Assessment:   1. Bunion   2. Hammer toe of right foot     Plan:  Patient was evaluated and treated and all questions answered.  S/p foot surgery bilaterally Continue cam boot for 2 more weeks on the right and then can transition to surgical shoe.  Return in several weeks for new x-rays.  Would like this right foot to be fully healed before proceeding with flexor tenotomy of the left third toe to reduce contracture which we discussed.  Return in about 7 weeks (around 10/19/2024) for  surgery follow up with new xrays, left third toe flexor tenotomy .  "

## 2024-09-13 ENCOUNTER — Inpatient Hospital Stay (HOSPITAL_COMMUNITY)

## 2024-09-13 ENCOUNTER — Other Ambulatory Visit: Payer: Self-pay

## 2024-09-13 ENCOUNTER — Emergency Department (HOSPITAL_COMMUNITY)

## 2024-09-13 ENCOUNTER — Inpatient Hospital Stay (HOSPITAL_COMMUNITY): Admission: EM | Admit: 2024-09-13 | Source: Home / Self Care | Attending: Neurology | Admitting: Neurology

## 2024-09-13 DIAGNOSIS — I10 Essential (primary) hypertension: Secondary | ICD-10-CM

## 2024-09-13 DIAGNOSIS — I340 Nonrheumatic mitral (valve) insufficiency: Secondary | ICD-10-CM | POA: Diagnosis not present

## 2024-09-13 DIAGNOSIS — G935 Compression of brain: Secondary | ICD-10-CM | POA: Diagnosis not present

## 2024-09-13 DIAGNOSIS — I615 Nontraumatic intracerebral hemorrhage, intraventricular: Secondary | ICD-10-CM

## 2024-09-13 DIAGNOSIS — R29719 NIHSS score 19: Secondary | ICD-10-CM

## 2024-09-13 DIAGNOSIS — G43909 Migraine, unspecified, not intractable, without status migrainosus: Secondary | ICD-10-CM | POA: Diagnosis not present

## 2024-09-13 DIAGNOSIS — I611 Nontraumatic intracerebral hemorrhage in hemisphere, cortical: Secondary | ICD-10-CM | POA: Diagnosis not present

## 2024-09-13 DIAGNOSIS — E785 Hyperlipidemia, unspecified: Secondary | ICD-10-CM

## 2024-09-13 DIAGNOSIS — F419 Anxiety disorder, unspecified: Secondary | ICD-10-CM | POA: Diagnosis not present

## 2024-09-13 DIAGNOSIS — R1310 Dysphagia, unspecified: Secondary | ICD-10-CM

## 2024-09-13 DIAGNOSIS — G936 Cerebral edema: Secondary | ICD-10-CM

## 2024-09-13 DIAGNOSIS — I619 Nontraumatic intracerebral hemorrhage, unspecified: Secondary | ICD-10-CM

## 2024-09-13 LAB — PROTIME-INR
INR: 0.9 (ref 0.8–1.2)
Prothrombin Time: 12.8 s (ref 11.4–15.2)

## 2024-09-13 LAB — I-STAT CHEM 8, ED
BUN: 19 mg/dL (ref 8–23)
Calcium, Ion: 1.13 mmol/L — ABNORMAL LOW (ref 1.15–1.40)
Chloride: 102 mmol/L (ref 98–111)
Creatinine, Ser: 0.7 mg/dL (ref 0.44–1.00)
Glucose, Bld: 136 mg/dL — ABNORMAL HIGH (ref 70–99)
HCT: 39 % (ref 36.0–46.0)
Hemoglobin: 13.3 g/dL (ref 12.0–15.0)
Potassium: 3.8 mmol/L (ref 3.5–5.1)
Sodium: 142 mmol/L (ref 135–145)
TCO2: 24 mmol/L (ref 22–32)

## 2024-09-13 LAB — URINALYSIS, ROUTINE W REFLEX MICROSCOPIC
Bilirubin Urine: NEGATIVE
Glucose, UA: NEGATIVE mg/dL
Ketones, ur: 5 mg/dL — AB
Leukocytes,Ua: NEGATIVE
Nitrite: NEGATIVE
Protein, ur: NEGATIVE mg/dL
Specific Gravity, Urine: 1.04 — ABNORMAL HIGH (ref 1.005–1.030)
pH: 7 (ref 5.0–8.0)

## 2024-09-13 LAB — HEMOGLOBIN A1C
Hgb A1c MFr Bld: 5.7 % — ABNORMAL HIGH (ref 4.8–5.6)
Mean Plasma Glucose: 116.89 mg/dL

## 2024-09-13 LAB — COMPREHENSIVE METABOLIC PANEL WITH GFR
ALT: 18 U/L (ref 0–44)
AST: 30 U/L (ref 15–41)
Albumin: 4.3 g/dL (ref 3.5–5.0)
Alkaline Phosphatase: 74 U/L (ref 38–126)
Anion gap: 10 (ref 5–15)
BUN: 18 mg/dL (ref 8–23)
CO2: 25 mmol/L (ref 22–32)
Calcium: 9 mg/dL (ref 8.9–10.3)
Chloride: 104 mmol/L (ref 98–111)
Creatinine, Ser: 0.7 mg/dL (ref 0.44–1.00)
GFR, Estimated: >60 mL/min
Glucose, Bld: 137 mg/dL — ABNORMAL HIGH (ref 70–99)
Potassium: 3.9 mmol/L (ref 3.5–5.1)
Sodium: 140 mmol/L (ref 135–145)
Total Bilirubin: 0.3 mg/dL (ref 0.0–1.2)
Total Protein: 6.5 g/dL (ref 6.5–8.1)

## 2024-09-13 LAB — ETHANOL: Alcohol, Ethyl (B): <15 mg/dL

## 2024-09-13 LAB — LIPID PANEL
Cholesterol: 193 mg/dL (ref 0–200)
HDL: 69 mg/dL
LDL Cholesterol: 113 mg/dL — ABNORMAL HIGH (ref 0–99)
Total CHOL/HDL Ratio: 2.8 ratio
Triglycerides: 52 mg/dL
VLDL: 10 mg/dL (ref 0–40)

## 2024-09-13 LAB — CBC
HCT: 38.1 % (ref 36.0–46.0)
Hemoglobin: 12.6 g/dL (ref 12.0–15.0)
MCH: 30.7 pg (ref 26.0–34.0)
MCHC: 33.1 g/dL (ref 30.0–36.0)
MCV: 92.7 fL (ref 80.0–100.0)
Platelets: 292 10*3/uL (ref 150–400)
RBC: 4.11 MIL/uL (ref 3.87–5.11)
RDW: 13.3 % (ref 11.5–15.5)
WBC: 11.3 10*3/uL — ABNORMAL HIGH (ref 4.0–10.5)
nRBC: 0 % (ref 0.0–0.2)

## 2024-09-13 LAB — DIFFERENTIAL
Abs Immature Granulocytes: 0.21 10*3/uL — ABNORMAL HIGH (ref 0.00–0.07)
Basophils Absolute: 0 10*3/uL (ref 0.0–0.1)
Basophils Relative: 0 %
Eosinophils Absolute: 0 10*3/uL (ref 0.0–0.5)
Eosinophils Relative: 0 %
Immature Granulocytes: 2 %
Lymphocytes Relative: 7 %
Lymphs Abs: 0.8 10*3/uL (ref 0.7–4.0)
Monocytes Absolute: 0.3 10*3/uL (ref 0.1–1.0)
Monocytes Relative: 3 %
Neutro Abs: 9.9 10*3/uL — ABNORMAL HIGH (ref 1.7–7.7)
Neutrophils Relative %: 88 %

## 2024-09-13 LAB — ECHOCARDIOGRAM COMPLETE
Area-P 1/2: 3.85 cm2
Height: 65 in
S' Lateral: 2.4 cm
Weight: 2179.91 [oz_av]

## 2024-09-13 LAB — MRSA NEXT GEN BY PCR, NASAL: MRSA by PCR Next Gen: NOT DETECTED

## 2024-09-13 LAB — CBG MONITORING, ED: Glucose-Capillary: 130 mg/dL — ABNORMAL HIGH (ref 70–99)

## 2024-09-13 LAB — APTT: aPTT: 25 s (ref 24–36)

## 2024-09-13 MED ORDER — GADOBUTROL 1 MMOL/ML IV SOLN
6.0000 mL | Freq: Once | INTRAVENOUS | Status: AC | PRN
  Administered 2024-09-13: 6 mL via INTRAVENOUS

## 2024-09-13 MED ORDER — ORAL CARE MOUTH RINSE
15.0000 mL | OROMUCOSAL | Status: AC
  Administered 2024-09-13 – 2024-09-17 (×17): 15 mL via OROMUCOSAL

## 2024-09-13 MED ORDER — ACETAMINOPHEN 650 MG RE SUPP
650.0000 mg | RECTAL | Status: AC | PRN
  Administered 2024-09-14: 650 mg via RECTAL
  Filled 2024-09-13: qty 1

## 2024-09-13 MED ORDER — PANTOPRAZOLE SODIUM 40 MG IV SOLR
40.0000 mg | Freq: Every day | INTRAVENOUS | Status: AC
  Administered 2024-09-13 – 2024-09-17 (×5): 40 mg via INTRAVENOUS
  Filled 2024-09-13 (×5): qty 10

## 2024-09-13 MED ORDER — CHLORHEXIDINE GLUCONATE CLOTH 2 % EX PADS
6.0000 | MEDICATED_PAD | Freq: Every day | CUTANEOUS | Status: AC
  Administered 2024-09-13 – 2024-09-17 (×5): 6 via TOPICAL

## 2024-09-13 MED ORDER — CLEVIDIPINE BUTYRATE 0.5 MG/ML IV EMUL: 0.0000 mg/h | INTRAVENOUS | Status: DC

## 2024-09-13 MED ORDER — STROKE: EARLY STAGES OF RECOVERY BOOK: Freq: Once | Status: AC

## 2024-09-13 MED ORDER — ORAL CARE MOUTH RINSE: 15.0000 mL | OROMUCOSAL | Status: AC | PRN

## 2024-09-13 MED ORDER — LABETALOL HCL 5 MG/ML IV SOLN
10.0000 mg | INTRAVENOUS | Status: AC | PRN
  Administered 2024-09-16 – 2024-09-17 (×5): 10 mg via INTRAVENOUS
  Filled 2024-09-13 (×4): qty 4

## 2024-09-13 MED ORDER — SENNOSIDES-DOCUSATE SODIUM 8.6-50 MG PO TABS
1.0000 | ORAL_TABLET | Freq: Two times a day (BID) | ORAL | Status: AC
  Administered 2024-09-14 – 2024-09-17 (×7): 1 via ORAL
  Filled 2024-09-13 (×7): qty 1

## 2024-09-13 MED ORDER — ACETAMINOPHEN 325 MG PO TABS
650.0000 mg | ORAL_TABLET | ORAL | Status: AC | PRN
  Administered 2024-09-17: 650 mg via ORAL
  Filled 2024-09-13: qty 2

## 2024-09-13 MED ORDER — ACETAMINOPHEN 160 MG/5ML PO SOLN: 650.0000 mg | ORAL | Status: AC | PRN

## 2024-09-13 MED ORDER — IOHEXOL 350 MG/ML SOLN
75.0000 mL | Freq: Once | INTRAVENOUS | Status: AC | PRN
  Administered 2024-09-13: 75 mL via INTRAVENOUS

## 2024-09-13 MED ORDER — SODIUM CHLORIDE 0.9% FLUSH
3.0000 mL | Freq: Once | INTRAVENOUS | Status: AC
  Administered 2024-09-13: 3 mL via INTRAVENOUS

## 2024-09-13 MED ORDER — HYDRALAZINE HCL 20 MG/ML IJ SOLN: 10.0000 mg | INTRAMUSCULAR | Status: AC | PRN

## 2024-09-13 NOTE — Progress Notes (Signed)
" °  Echocardiogram 2D Echocardiogram has been performed.  LAMON MAXWELL 09/13/2024, 11:14 AM "

## 2024-09-13 NOTE — Code Documentation (Signed)
 Stroke Response Nurse Documentation Code Documentation  Tina Murray is a 74 y.o. female arriving to [facility]  via [facility] on 09/13/2024 with past medical hx of migraines, TIA. On clopidogrel  75 mg daily. Code stroke was activated by EMS.   Patient from home where she was LKW at midnight and now complaining of rt gaze, left hemiplegia.   Stroke team at the bedside on patient arrival. Labs drawn and patient cleared for CT by Dr. Franklyn. Patient to CT with team. NIHSS 19, see documentation for details and code stroke times. Patient with disoriented, right gaze preference , left facial droop, left arm weakness, left leg weakness, left decreased sensation, dysarthria , and Sensory  neglect on exam. The following imaging was completed:  CT Head and CTA. Patient is not a candidate for IV Thrombolytic due to ICH. Patient is not a candidate for IR due to ICH.   Care Plan:  Hemorrhage: q1h NIHSS, VS, and pupils.  HOB > 30 degrees  NPO until pass stroke swallow screen   Bedside handoff with ED RN Andriette.    Shirin Echeverry Livengood  Stroke Response RN

## 2024-09-13 NOTE — Evaluation (Signed)
 Clinical/Bedside Swallow Evaluation Patient Details  Name: Tina Murray MRN: [id number] Date of Birth: 08/10/1951  Today's Date: 09/13/2024 Time: SLP Start Time (ACUTE ONLY): 1339 SLP Stop Time (ACUTE ONLY): 1402 SLP Time Calculation (min) (ACUTE ONLY): 23 min  Past Medical History:  Past Medical History:  Diagnosis Date   DDD (degenerative disc disease), lumbar    GAD (generalized anxiety disorder)    Headache    Hypercholesterolemia    Hyperglycemia    Hyperlipemia    Lumbar radiculopathy    Migraine    Osteoarthritis    Osteoporosis    Paresthesia of foot    right   Sacroiliac joint pain    Sciatica of right side    Scoliosis    Seasonal allergies    Stroke (HCC)    TIA (transient ischemic attack)    10/2022 and 11/2022   Past Surgical History:  Past Surgical History:  Procedure Laterality Date   ABLATION     back   HAND SURGERY Right    Right middle finger trigger release   OVARIAN CYST REMOVAL Left 1977   HPI:  Tina Murray is a 74 yo female admitted on 09/13/24 with acute L sided weakness, L facial droop, and R gaze preference. Head CT revealed a Large (57 cc) right frontal lobe intracerebral hemorrhage with intraventricular  extension and right vertex subarachnoid hemorrhage, resulting in surrounding  cerebral edema and 5 mm leftward midline shift.  hx of generalized anxiety disorder, headaches/migraines, HLD, HTN, prior stroke. SLP consulted for cognitive-linguistic evalaution and bedside swallow evaluation.    Assessment / Plan / Recommendation  Clinical Impression   Pt presents with a mild-moderate oral dysphagia and concerns for a pharyngeal dysphagia per clinical swallow assessment completed today. Recommend NPO with the exception of ice chips post oral care. Recommend FEES when pt is more alert to determine if she can safely consume a modified diet.   Oral phase significant for reduced labial closure due to L labial weakness. Anterior spillage observed with  larger sips of thin liquids by cup and straw. Increased oral prep and transit time observed with applesauce trial, though pt able to clear from oral cavity. Deferred solids at this time.   Concerns for pharyngeal dysphagia included delayed swallow initiation to palpation, multiple swallows (concerning for pharyngeal residue), and both immediate and delayed weak, choked coughing following straw sips of thin liquids.   SLP will continue to follow to address dysphagia and cognitive-linguistic goals.  SLP Visit Diagnosis: Dysphagia, unspecified (R13.10);Dysphagia, oral phase (R13.11)    Aspiration Risk  Moderate aspiration risk    Diet Recommendation NPO;Ice chips PRN after oral care    Liquid Administration via: Spoon Medication Administration: Via alternative means Supervision: Staff to assist with self feeding;Full supervision/cueing for compensatory strategies Compensations: Slow rate;Small sips/bites (ice only) Postural Changes: Seated upright at 90 degrees    Other Recommendations Oral Care Recommendations: Oral care QID      Functional Status Assessment Patient has had a recent decline in their functional status and demonstrates the ability to make significant improvements in function in a reasonable and predictable amount of time.  Frequency and Duration min 2x/week  4 weeks       Prognosis Prognosis for improved oropharyngeal function: Fair      Swallow Study   General Date of Onset: 09/13/24 HPI: Tina Murray is a 74 yo female admitted on 09/13/24 with acute L sided weakness, L facial droop, and R gaze preference. Head CT revealed a Large (  57 cc) right frontal lobe intracerebral hemorrhage with intraventricular  extension and right vertex subarachnoid hemorrhage, resulting in surrounding  cerebral edema and 5 mm leftward midline shift.  hx of generalized anxiety disorder, headaches/migraines, HLD, HTN, prior stroke. SLP consulted for cognitive-linguistic evalaution and bedside  swallow evaluation. Type of Study: Bedside Swallow Evaluation Previous Swallow Assessment: none per chart Diet Prior to this Study: NPO Temperature Spikes Noted: No Respiratory Status: Room air History of Recent Intubation: No Behavior/Cognition: Alert;Requires cueing;Lethargic/Drowsy Oral Cavity Assessment: Within Functional Limits Oral Care Completed by SLP: No Oral Cavity - Dentition: Adequate natural dentition Vision: Impaired for self-feeding Self-Feeding Abilities: Total assist Patient Positioning: Upright in bed Baseline Vocal Quality: Low vocal intensity Volitional Cough: Weak Volitional Swallow: Able to elicit (delayed)    Oral/Motor/Sensory Function Overall Oral Motor/Sensory Function: Moderate impairment Facial ROM: Reduced left Facial Symmetry: Abnormal symmetry left Facial Strength: Reduced left Lingual ROM: Reduced right;Reduced left Lingual Strength: Reduced Velum:  (general reduced elevation) Mandible: Within Functional Limits   Ice Chips Ice chips: Within functional limits   Thin Liquid Thin Liquid: Impaired Presentation: Cup;Spoon;Straw Oral Phase Impairments: Reduced labial seal Oral Phase Functional Implications: Left anterior spillage Pharyngeal  Phase Impairments: Cough - Delayed;Cough - Immediate;Multiple swallows    Nectar Thick Nectar Thick Liquid: Not tested   Honey Thick Honey Thick Liquid: Not tested   Puree Puree: Impaired Presentation: Spoon Oral Phase Impairments: Reduced lingual movement/coordination Oral Phase Functional Implications: Prolonged oral transit Pharyngeal Phase Impairments: Suspected delayed Swallow   Solid     Solid: Not tested      Tina Murray 09/13/2024,3:46 PM

## 2024-09-13 NOTE — Progress Notes (Signed)
 OT Cancellation Note  Patient Details Name: Tina Murray MRN: [id number] DOB: 1951/07/05   Cancelled Treatment:    Reason Eval/Treat Not Completed: Active bedrest order  Charlie JONETTA Halsted 09/13/2024, 9:36 AM 09/13/2024  RP, OTR/L  Acute Rehabilitation Services  Office:  [phone]

## 2024-09-14 ENCOUNTER — Inpatient Hospital Stay (HOSPITAL_COMMUNITY)

## 2024-09-14 DIAGNOSIS — I611 Nontraumatic intracerebral hemorrhage in hemisphere, cortical: Secondary | ICD-10-CM | POA: Diagnosis not present

## 2024-09-14 DIAGNOSIS — G936 Cerebral edema: Secondary | ICD-10-CM | POA: Diagnosis not present

## 2024-09-14 DIAGNOSIS — I619 Nontraumatic intracerebral hemorrhage, unspecified: Secondary | ICD-10-CM | POA: Diagnosis not present

## 2024-09-14 DIAGNOSIS — I69391 Dysphagia following cerebral infarction: Secondary | ICD-10-CM | POA: Diagnosis not present

## 2024-09-14 DIAGNOSIS — I10 Essential (primary) hypertension: Secondary | ICD-10-CM | POA: Diagnosis not present

## 2024-09-14 DIAGNOSIS — I609 Nontraumatic subarachnoid hemorrhage, unspecified: Secondary | ICD-10-CM

## 2024-09-14 DIAGNOSIS — I615 Nontraumatic intracerebral hemorrhage, intraventricular: Secondary | ICD-10-CM | POA: Diagnosis not present

## 2024-09-14 LAB — PHOSPHORUS: Phosphorus: 3 mg/dL (ref 2.5–4.6)

## 2024-09-14 LAB — BASIC METABOLIC PANEL WITH GFR
Anion gap: 15 (ref 5–15)
BUN: 15 mg/dL (ref 8–23)
CO2: 22 mmol/L (ref 22–32)
Calcium: 9 mg/dL (ref 8.9–10.3)
Chloride: 103 mmol/L (ref 98–111)
Creatinine, Ser: 0.7 mg/dL (ref 0.44–1.00)
GFR, Estimated: >60 mL/min
Glucose, Bld: 135 mg/dL — ABNORMAL HIGH (ref 70–99)
Potassium: 3.2 mmol/L — ABNORMAL LOW (ref 3.5–5.1)
Sodium: 140 mmol/L (ref 135–145)

## 2024-09-14 LAB — CBC
HCT: 37.3 % (ref 36.0–46.0)
Hemoglobin: 12.9 g/dL (ref 12.0–15.0)
MCH: 31 pg (ref 26.0–34.0)
MCHC: 34.6 g/dL (ref 30.0–36.0)
MCV: 89.7 fL (ref 80.0–100.0)
Platelets: 305 10*3/uL (ref 150–400)
RBC: 4.16 MIL/uL (ref 3.87–5.11)
RDW: 13.6 % (ref 11.5–15.5)
WBC: 14.4 10*3/uL — ABNORMAL HIGH (ref 4.0–10.5)
nRBC: 0 % (ref 0.0–0.2)

## 2024-09-14 LAB — URINALYSIS, ROUTINE W REFLEX MICROSCOPIC
Bilirubin Urine: NEGATIVE
Glucose, UA: NEGATIVE mg/dL
Ketones, ur: 5 mg/dL — AB
Leukocytes,Ua: NEGATIVE
Nitrite: NEGATIVE
Protein, ur: 100 mg/dL — AB
RBC / HPF: >50 RBC/hpf (ref 0–5)
Specific Gravity, Urine: 1.027 (ref 1.005–1.030)
pH: 5 (ref 5.0–8.0)

## 2024-09-14 LAB — MAGNESIUM: Magnesium: 1.9 mg/dL (ref 1.7–2.4)

## 2024-09-14 MED ORDER — POTASSIUM CHLORIDE 10 MEQ/100ML IV SOLN
10.0000 meq | INTRAVENOUS | Status: AC
  Administered 2024-09-14 (×4): 10 meq via INTRAVENOUS
  Filled 2024-09-14 (×3): qty 100

## 2024-09-14 MED ORDER — SODIUM CHLORIDE 0.9 % IV BOLUS
500.0000 mL | Freq: Once | INTRAVENOUS | Status: AC
  Administered 2024-09-14: 500 mL via INTRAVENOUS

## 2024-09-14 MED ORDER — POTASSIUM CHLORIDE 10 MEQ/100ML IV SOLN
10.0000 meq | INTRAVENOUS | Status: DC
  Filled 2024-09-14: qty 100

## 2024-09-14 MED ORDER — ARTIFICIAL TEARS OPHTHALMIC OINT
TOPICAL_OINTMENT | Freq: Three times a day (TID) | OPHTHALMIC | Status: AC
  Administered 2024-09-15: 1 via OPHTHALMIC
  Filled 2024-09-14 (×4): qty 3.5

## 2024-09-14 MED ORDER — MAGNESIUM SULFATE 2 GM/50ML IV SOLN
2.0000 g | Freq: Once | INTRAVENOUS | Status: AC
  Administered 2024-09-14: 2 g via INTRAVENOUS
  Filled 2024-09-14: qty 50

## 2024-09-14 MED ORDER — SODIUM CHLORIDE 0.9 % IV SOLN: INTRAVENOUS | Status: DC

## 2024-09-14 NOTE — TOC CAGE-AID Note (Signed)
 Transition of Care [facility]) - CAGE-AID Screening   Patient Details  Name: Keirstin Musil MRN: [id number] Date of Birth: 1951/02/02  Transition of Care [facility]) CM/SW Contact:    Venus Ruhe E Kemon Devincenzi, LCSW Phone Number: 09/14/2024, 9:21 AM   Clinical Narrative: No SA noted.   CAGE-AID Screening: Substance Abuse Screening unable to be completed due to: : Patient unable to participate

## 2024-09-14 NOTE — Progress Notes (Signed)
   Inpatient Rehab Admissions Coordinator :  Per therapy recommendations patient was screened for CIR candidacy by Ottie Glazier RN MSN. Patient is not yet at a level to tolerate the intensity required to pursue a CIR admit . The CIR admissions team will follow and monitor for progress and place a Rehab Consult order if felt to be appropriate. Please contact me with any questions.  Ottie Glazier RN MSN Admissions Coordinator [phone]

## 2024-09-14 NOTE — Progress Notes (Signed)
 Orthopedic Tech Progress Note Patient Details:  Tina Murray 11-28-1950 [id number]  Delivered [employer] to bedside with husband at bedside     Patient ID: Tina Murray, female   DOB: [birth year], 74 y.o.   MRN: [id number]  Tina Murray Pac 09/14/2024, 11:04 AM

## 2024-09-14 NOTE — TOC Initial Note (Addendum)
 Transition of Care [facility]) - Initial/Assessment Note    Patient Details  Name: Tina Murray MRN: [id number] Date of Birth: 12/24/1950  [facility]) CM/SW Contact:    Iven Earnhart E Todd Jelinski, LCSW Phone Number: 09/14/2024, 1:02 PM  Clinical Narrative:                 Patient was admitted for a stroke. Therapy is recommending CIR. Per rounds, patient has some confusion at this time. CSW called and spoke with patient's spouse Lorrene. He states he and patient live in ILF at [place]. He states patient is independent at her baseline and does not use any DME. Patient is very active at baseline. Explained [facility] rec as well as option for [facility] STR when medically ready. Lorrene states they would prefer CIR if possible due to the increased intensity of therapies at [facility].  CSW also spoke with Brittany at [facility] who confirms patient is a resident there and can come to their STR if needed/requested. Noted CIR is following for eligibility when medically ready. ICM will continue to follow.   Expected Discharge Plan: Skilled Nursing Facility Barriers to Discharge: Continued Medical Work up   Patient Goals and CMS Choice   CMS Medicare.gov Compare Post Acute Care list provided to:: Patient Represenative (must comment) Choice offered to / list presented to : Spouse      Expected Discharge Plan and Services       Living arrangements for the past 2 months: Independent Living Facility                                      Prior Living Arrangements/Services Living arrangements for the past 2 months: Independent Living Facility Lives with:: Spouse Patient language and need for interpreter reviewed:: Yes Do you feel safe going back to the place where you live?: Yes      Need for Family Participation in Patient Care: Yes (Comment) Care giver support system in place?: Yes (comment)   Criminal Activity/Legal Involvement Pertinent to Current Situation/Hospitalization: No - Comment as  needed  Activities of Daily Living      Permission Sought/Granted Permission sought to share information with : Facility Industrial/product Designer granted to share information with : Yes, Verbal Permission Granted (by spouse)     Permission granted to share info w AGENCY: [facility]; [facility]        Emotional Assessment       Orientation: : Fluctuating Orientation (Suspected and/or reported Sundowners) Alcohol / Substance Use: Not Applicable Psych Involvement: No (comment)  Admission diagnosis:  Intracerebral hemorrhage (HCC) [I61.9] Patient Active Problem List   Diagnosis Date Noted   Intracerebral hemorrhage (HCC) 09/13/2024   Atopic dermatitis 03/30/2024   Basal cell carcinoma of scalp 03/30/2024   Benign neoplasm of skin of lower limb 03/30/2024   DDD (degenerative disc disease), cervical 03/30/2024   Generalized anxiety disorder 03/30/2024   Hepatic hemangioma 03/30/2024   History of malignant neoplasm of skin 03/30/2024   Human leukocyte antigen B27 positive 03/30/2024   Idiopathic osteoarthritis 03/30/2024   Melanocytic nevus of trunk 03/30/2024   Microscopic hematuria 03/30/2024   Neurofibroma 03/30/2024   Notalgia paresthetica 03/30/2024   Prediabetes 03/30/2024   Scoliosis of lumbosacral spine 03/30/2024   Seborrheic keratoses 03/30/2024   Referred otalgia, left 01/22/2024   Age-related osteoporosis without current pathological fracture 08/27/2022   Osteoporosis 08/21/2022   Chronic migraine w/o aura w/o status migrainosus,  not intractable 01/10/2021   Anxiety 01/10/2021   Gastroesophageal reflux disease 11/18/2019   Eustachian tube dysfunction, bilateral 04/27/2019   Neck pain 11/24/2017   Nasal turbinate hypertrophy 12/03/2016   Non-seasonal allergic rhinitis 12/03/2016   PCP:  Delayne Artist PARAS, MD Pharmacy:   [facility] [phone] [place], [place] - [address] AT [facility] [address] [facility] [facility] [address] [facility] [address] [place]  [address] Phone: [phone] Fax: [phone]     Social Drivers of Health (SDOH) Social History: SDOH Screenings   Food Insecurity: No Food Insecurity (09/13/2024)  Housing: Low Risk (09/13/2024)  Transportation Needs: No Transportation Needs (09/13/2024)  Utilities: Not At Risk (09/13/2024)  Social Connections: Socially Integrated (09/13/2024)  Tobacco Use: Medium Risk (09/02/2024)   SDOH Interventions:     Readmission Risk Interventions     No data to display

## 2024-09-14 NOTE — Evaluation (Signed)
 Physical Therapy Evaluation Patient Details Name: Tina Murray MRN: [id number] DOB: 1950-09-02 Today's Date: 09/14/2024  History of Present Illness  74 yo F adm 09/13/24 with falls and Lt weakness, large intraparenchymal hematoma of Rt frontal lobe. PMHx: DDD, GAD, HLD, HTN, CVA, lumbar radiculopathy, OA, osteoporosis, sciatica, scoliosis  Clinical Impression  Pt pleasant with Rt gaze, Left neglect, decreased awareness and fixated on getting to bathroom despite foley. Max +2 for transfers in standing with pt unaware of level of assist required despite education. Pt from [facility] with spouse who can assist at D/C and reports being active and playing volleyball at baseline. Pt with strength, balance, and cognitive deficits who will benefit from acute therapy to maximize mobility, safety and function to decrease burden of care. Patient will benefit from intensive inpatient follow-up therapy, >3 hours/day  BP 133/85 HR 96 SPO2 95% RA      If plan is discharge home, recommend the following: Two people to help with walking and/or transfers;Two people to help with bathing/dressing/bathroom;Direct supervision/assist for medications management;Assistance with cooking/housework;Assistance with feeding;Assist for transportation;Direct supervision/assist for financial management;Supervision due to cognitive status   Can travel by private vehicle        Equipment Recommendations Wheelchair (measurements PT);Wheelchair cushion (measurements PT)  Recommendations for Other Services  Rehab consult    Functional Status Assessment Patient has had a recent decline in their functional status and demonstrates the ability to make significant improvements in function in a reasonable and predictable amount of time.     Precautions / Restrictions Precautions Precautions: Fall Precaution/Restrictions Comments: SBP <150      Mobility  Bed Mobility Overal bed mobility: Needs Assistance Bed Mobility: Supine to  Sit     Supine to sit: Max assist     General bed mobility comments: coming toward Rt side with physical assist to clear LLE and elevate trunk from surface. Mod assist for sitting balance EOB    Transfers Overall transfer level: Needs assistance Equipment used: 2 person hand held assist Transfers: Sit to/from Stand, Bed to chair/wheelchair/BSC Sit to Stand: +2 physical assistance, Max assist Stand pivot transfers: +2 physical assistance, Max assist         General transfer comment: pt requires facilitation of turn and does not step. pt with L side blocked. pivot bed>BSC>recliner. pt unable to initiate or sequence rise or transfers    Ambulation/Gait                  Stairs            Wheelchair Mobility     Tilt Bed    Modified Rankin (Stroke Patients Only) Modified Rankin (Stroke Patients Only) Pre-Morbid Rankin Score: No symptoms Modified Rankin: Severe disability     Balance Overall balance assessment: Needs assistance Sitting-balance support: Feet supported, No upper extremity supported Sitting balance-Leahy Scale: Poor Sitting balance - Comments: pushing with R UE   Standing balance support: Single extremity supported, During functional activity Standing balance-Leahy Scale: Zero Standing balance comment: needs max cues for head positioning, max +2 for standing balance                             Pertinent Vitals/Pain Pain Assessment Pain Assessment: No/denies pain    Home Living Family/patient expects to be discharged to:: Private residence Living Arrangements: Spouse/significant other Available Help at Discharge: Family;Available 24 hours/day Type of Home: Independent living facility Home Access: Level entry  Home Layout: One level Home Equipment: None;Grab bars - tub/shower;Grab bars - toilet;Hand held shower head Additional Comments: plays volleyball normally at [place]    Prior Function Prior Level of Function :  Independent/Modified Independent                     Extremity/Trunk Assessment   Upper Extremity Assessment Upper Extremity Assessment: Defer to OT evaluation LUE Deficits / Details: sponatenous movement seen but not able to follow to command even with visual attention. Pt decreased awareness to sensation LUE Sensation: decreased light touch;decreased proprioception LUE Coordination: decreased fine motor;decreased gross motor    Lower Extremity Assessment Lower Extremity Assessment: LLE deficits/detail LLE Deficits / Details: moving twice to command with delay but no response to noxious stimuli, knee extension and hip flexion in standing    Cervical / Trunk Assessment Cervical / Trunk Assessment: Normal  Communication   Communication Communication: Impaired Factors Affecting Communication: Reduced clarity of speech    Cognition Arousal: Alert Behavior During Therapy: Flat affect   PT - Cognitive impairments: Orientation, Problem solving, Safety/Judgement, Sequencing, Attention, Initiation, Awareness   Orientation impairments: Time, Situation                   PT - Cognition Comments: pt with left neglect, repeating transfers are easy despite max +2 assist and unable to initiate rise from surface or sitting balance Following commands: Impaired Following commands impaired: Follows one step commands with increased time, Follows one step commands inconsistently     Cueing Cueing Techniques: Verbal cues, Gestural cues, Tactile cues, Visual cues     General Comments General comments (skin integrity, edema, etc.): VSS on RA    Exercises     Assessment/Plan    PT Assessment Patient needs continued PT services  PT Problem List Decreased strength;Decreased coordination;Decreased activity tolerance;Decreased balance;Decreased mobility;Decreased cognition;Decreased knowledge of use of DME;Decreased safety awareness;Decreased knowledge of precautions       PT  Treatment Interventions Gait training;Functional mobility training;Therapeutic activities;Patient/family education;Cognitive remediation;Neuromuscular re-education;Balance training;Therapeutic exercise;DME instruction    PT Goals (Current goals can be found in the Care Plan section)  Acute Rehab PT Goals Patient Stated Goal: be able to return home PT Goal Formulation: With patient Time For Goal Achievement: 09/28/24 Potential to Achieve Goals: Fair    Frequency Min 2X/week     Co-evaluation PT/OT/SLP Co-Evaluation/Treatment: Yes Reason for Co-Treatment: Complexity of the patient's impairments (multi-system involvement);For patient/therapist safety;To address functional/ADL transfers PT goals addressed during session: Mobility/safety with mobility;Balance OT goals addressed during session: ADL's and self-care;Strengthening/ROM       AM-PAC PT 6 Clicks Mobility  Outcome Measure Help needed turning from your back to your side while in a flat bed without using bedrails?: A Lot Help needed moving from lying on your back to sitting on the side of a flat bed without using bedrails?: A Lot Help needed moving to and from a bed to a chair (including a wheelchair)?: Total Help needed standing up from a chair using your arms (e.g., wheelchair or bedside chair)?: Total Help needed to walk in hospital room?: Total Help needed climbing 3-5 steps with a railing? : Total 6 Click Score: 8    End of Session Equipment Utilized During Treatment: Gait belt Activity Tolerance: Patient tolerated treatment well Patient left: in chair;with call bell/phone within reach;with chair alarm set;with family/visitor present Nurse Communication: Mobility status PT Visit Diagnosis: Other abnormalities of gait and mobility (R26.89);Difficulty in walking, not elsewhere classified (R26.2);Other symptoms and  signs involving the nervous system (R29.898);Hemiplegia and hemiparesis Hemiplegia - Right/Left:  Left Hemiplegia - dominant/non-dominant: Non-dominant Hemiplegia - caused by: Nontraumatic intracerebral hemorrhage    Time: 0932-0958 PT Time Calculation (min) (ACUTE ONLY): 26 min   Charges:   PT Evaluation $PT Eval Moderate Complexity: 1 Mod   PT General Charges $$ ACUTE PT VISIT: 1 Visit         Lenoard SQUIBB, PT Acute Rehabilitation Services Office: [phone]   Lenoard NOVAK Leaman Abe 09/14/2024, 10:50 AM

## 2024-09-15 DIAGNOSIS — G936 Cerebral edema: Secondary | ICD-10-CM | POA: Diagnosis not present

## 2024-09-15 DIAGNOSIS — I611 Nontraumatic intracerebral hemorrhage in hemisphere, cortical: Secondary | ICD-10-CM | POA: Diagnosis not present

## 2024-09-15 DIAGNOSIS — I615 Nontraumatic intracerebral hemorrhage, intraventricular: Secondary | ICD-10-CM | POA: Diagnosis not present

## 2024-09-15 DIAGNOSIS — I69391 Dysphagia following cerebral infarction: Secondary | ICD-10-CM | POA: Diagnosis not present

## 2024-09-15 DIAGNOSIS — I619 Nontraumatic intracerebral hemorrhage, unspecified: Secondary | ICD-10-CM | POA: Diagnosis not present

## 2024-09-15 DIAGNOSIS — I10 Essential (primary) hypertension: Secondary | ICD-10-CM | POA: Diagnosis not present

## 2024-09-15 LAB — CBC
HCT: 38.5 % (ref 36.0–46.0)
Hemoglobin: 13 g/dL (ref 12.0–15.0)
MCH: 30.5 pg (ref 26.0–34.0)
MCHC: 33.8 g/dL (ref 30.0–36.0)
MCV: 90.4 fL (ref 80.0–100.0)
Platelets: 314 10*3/uL (ref 150–400)
RBC: 4.26 MIL/uL (ref 3.87–5.11)
RDW: 13.8 % (ref 11.5–15.5)
WBC: 15.5 10*3/uL — ABNORMAL HIGH (ref 4.0–10.5)
nRBC: 0 % (ref 0.0–0.2)

## 2024-09-15 LAB — BASIC METABOLIC PANEL WITH GFR
Anion gap: 12 (ref 5–15)
BUN: 13 mg/dL (ref 8–23)
CO2: 20 mmol/L — ABNORMAL LOW (ref 22–32)
Calcium: 8.3 mg/dL — ABNORMAL LOW (ref 8.9–10.3)
Chloride: 105 mmol/L (ref 98–111)
Creatinine, Ser: 0.57 mg/dL (ref 0.44–1.00)
GFR, Estimated: >60 mL/min
Glucose, Bld: 108 mg/dL — ABNORMAL HIGH (ref 70–99)
Potassium: 3.6 mmol/L (ref 3.5–5.1)
Sodium: 137 mmol/L (ref 135–145)

## 2024-09-15 LAB — MAGNESIUM: Magnesium: 2.1 mg/dL (ref 1.7–2.4)

## 2024-09-15 MED ORDER — POTASSIUM CHLORIDE 20 MEQ PO PACK
40.0000 meq | PACK | Freq: Once | ORAL | Status: AC
  Administered 2024-09-15: 40 meq via ORAL
  Filled 2024-09-15: qty 2

## 2024-09-15 MED ORDER — SODIUM CHLORIDE 1 G PO TABS
2.0000 g | ORAL_TABLET | Freq: Three times a day (TID) | ORAL | Status: AC
  Administered 2024-09-16 – 2024-09-17 (×6): 2 g via ORAL
  Filled 2024-09-15 (×5): qty 2

## 2024-09-15 NOTE — Progress Notes (Signed)
 Physical Therapy Treatment Patient Details Name: Tina Murray MRN: [id number] DOB: Oct 30, 1950 Today's Date: 09/15/2024   History of Present Illness 74 yo F adm 09/13/24 with falls and Lt weakness, large intraparenchymal hematoma of Rt frontal lobe. PMHx: DDD, GAD, HLD, HTN, CVA, lumbar radiculopathy, OA, osteoporosis, sciatica, scoliosis    PT Comments  Pt more conversant this date with improved command follow. Pt remains to require maxA for transfers. Pt continues to demo L hemiparesis with increased tone, L neglect but improved attention with verbal cues t/o session, impaired balance, and impaired sequencing. Pt to continue to benefit from aggressive inpatient rehab program > 3 hrs a day to address above deficits and achieve safe level of function to return home with spouse. Acute PT to cont to follow.    If plan is discharge home, recommend the following: Two people to help with walking and/or transfers;Two people to help with bathing/dressing/bathroom;Direct supervision/assist for medications management;Assistance with cooking/housework;Assistance with feeding;Assist for transportation;Direct supervision/assist for financial management;Supervision due to cognitive status   Can travel by private vehicle        Equipment Recommendations  Wheelchair (measurements PT);Wheelchair cushion (measurements PT)    Recommendations for Other Services Rehab consult     Precautions / Restrictions Precautions Precautions: Fall Precaution/Restrictions Comments: SBP <150 Restrictions Weight Bearing Restrictions Per Provider Order: No     Mobility  Bed Mobility Overal bed mobility: Needs Assistance Bed Mobility: Supine to Sit Rolling: Mod assist   Supine to sit: Max assist     General bed mobility comments: coming toward Rt side with physical assist to clear LLE and elevate trunk from surface. Mod assist for sitting balance EOB. pt unable to use L UE functionally    Transfers Overall  transfer level: Needs assistance Equipment used: 2 person hand held assist (face to face transfer with gait belt) Transfers: Sit to/from Stand, Bed to chair/wheelchair/BSC Sit to Stand: +2 physical assistance, Max assist Stand pivot transfers: +2 physical assistance, Max assist         General transfer comment: pt requires tactile facilitation of turn and does not step. pt with L side blocked. pivot bed>BSC>recliner. pt unable to initiate or sequence rise or transfers. Pt with L UE and LE in extensor tone, pt completed 2 std pvt transfers bed to [facility] and BSC to recliner    Ambulation/Gait               General Gait Details: unable   Stairs             Wheelchair Mobility     Tilt Bed    Modified Rankin (Stroke Patients Only) Modified Rankin (Stroke Patients Only) Pre-Morbid Rankin Score: No symptoms Modified Rankin: Severe disability     Balance Overall balance assessment: Needs assistance Sitting-balance support: Feet supported, No upper extremity supported Sitting balance-Leahy Scale: Poor Sitting balance - Comments: pushing with R UE, with tactile cues behind L knee pt able to bend L knee at EOB, pt progressing to CGA at times while EOB, R UE place in lap to minimize desire to push with R UE Postural control: Right lateral lean Standing balance support: Single extremity supported, During functional activity Standing balance-Leahy Scale: Zero Standing balance comment: needs max cues for head positioning, max +2 for standing balance, tactile cues at posterior hips to promote trunk extension, L knee blocked, stood x 30 sec for hygiene s/p tolieting  Communication Communication Communication: Impaired Factors Affecting Communication: Reduced clarity of speech  Cognition Arousal: Alert Behavior During Therapy: Flat affect   PT - Cognitive impairments: Orientation, Problem solving, Safety/Judgement, Sequencing, Attention,  Initiation, Awareness   Orientation impairments: Time, Situation                   PT - Cognition Comments: pt with left neglect, pt attempting to communicate, favors R side but can tend to L with verbal cues Following commands: Impaired Following commands impaired: Follows one step commands with increased time, Follows one step commands inconsistently    Cueing Cueing Techniques: Verbal cues, Gestural cues, Tactile cues, Visual cues  Exercises      General Comments General comments (skin integrity, edema, etc.): VSS on RA      Pertinent Vitals/Pain Pain Assessment Pain Assessment: Faces Faces Pain Scale: No hurt Pain Location: however pt's spouse requested ice pack for neck    Home Living                          Prior Function            PT Goals (current goals can now be found in the care plan section) Acute Rehab PT Goals PT Goal Formulation: With patient Time For Goal Achievement: 09/28/24 Potential to Achieve Goals: Fair Progress towards PT goals: Progressing toward goals    Frequency    Min 4X/week      PT Plan      Co-evaluation              AM-PAC PT 6 Clicks Mobility   Outcome Measure  Help needed turning from your back to your side while in a flat bed without using bedrails?: A Lot Help needed moving from lying on your back to sitting on the side of a flat bed without using bedrails?: A Lot Help needed moving to and from a bed to a chair (including a wheelchair)?: Total Help needed standing up from a chair using your arms (e.g., wheelchair or bedside chair)?: Total Help needed to walk in hospital room?: Total Help needed climbing 3-5 steps with a railing? : Total 6 Click Score: 8    End of Session Equipment Utilized During Treatment: Gait belt Activity Tolerance: Patient tolerated treatment well Patient left: in chair;with call bell/phone within reach;with chair alarm set;with family/visitor present Nurse  Communication: Mobility status PT Visit Diagnosis: Other abnormalities of gait and mobility (R26.89);Difficulty in walking, not elsewhere classified (R26.2);Other symptoms and signs involving the nervous system (R29.898);Hemiplegia and hemiparesis Hemiplegia - Right/Left: Left Hemiplegia - dominant/non-dominant: Non-dominant Hemiplegia - caused by: Nontraumatic intracerebral hemorrhage     Time: 9061-8984 PT Time Calculation (min) (ACUTE ONLY): 37 min  Charges:    $Therapeutic Activity: 8-22 mins $Neuromuscular Re-education: 8-22 mins PT General Charges $$ ACUTE PT VISIT: 1 Visit                     Norene Ames, PT, DPT Acute Rehabilitation Services Secure chat preferred Office #: [phone]    Norene CHRISTELLA Ames 09/15/2024, 1:41 PM

## 2024-09-15 NOTE — Progress Notes (Signed)
 She is more alert than when I saw her yesterday.  Less agitated. Still has a forced gaze to the right. Normal speech reception and expression. Left arm is still plegic with increased tone.  She does have some movement of the left leg. She neglects the left visual field and neglects the left arm and leg to simultaneous stimulation.  Assessment:  Neurological exam is stable.  Will reevaluate in the morning.  Possible arteriogram tomorrow.

## 2024-09-15 NOTE — Progress Notes (Signed)
 STROKE TEAM PROGRESS NOTE    SIGNIFICANT HOSPITAL EVENTS 2/2- admitted to 4NICU with ICH  INTERIM HISTORY/SUBJECTIVE Injured neck earlier this week. Husband found her on the floor Monday morning.  PT and OT were able to get her up into the chair, recommending CIR.  Husband is at the bedside.  OBJECTIVE  CBC    Component Value Date/Time   WBC 15.5 (H) 09/15/2024 0607   RBC 4.26 09/15/2024 0607   HGB 13.0 09/15/2024 0607   HGB 13.9 10/20/2023 0859   HCT 38.5 09/15/2024 0607   HCT 42.5 10/20/2023 0859   PLT 314 09/15/2024 0607   PLT 288 10/20/2023 0859   MCV 90.4 09/15/2024 0607   MCV 93 10/20/2023 0859   MCH 30.5 09/15/2024 0607   MCHC 33.8 09/15/2024 0607   RDW 13.8 09/15/2024 0607   RDW 13.2 10/20/2023 0859   LYMPHSABS 0.8 09/13/2024 0901   MONOABS 0.3 09/13/2024 0901   EOSABS 0.0 09/13/2024 0901   BASOSABS 0.0 09/13/2024 0901    BMET    Component Value Date/Time   NA 137 09/15/2024 0607   NA 142 10/20/2023 0859   K 3.6 09/15/2024 0607   CL 105 09/15/2024 0607   CO2 20 (L) 09/15/2024 0607   GLUCOSE 108 (H) 09/15/2024 0607   BUN 13 09/15/2024 0607   BUN 17 10/20/2023 0859   CREATININE 0.57 09/15/2024 0607   CREATININE 0.90 08/21/2022 1148   CALCIUM  8.3 (L) 09/15/2024 0607   EGFR 57 (L) 10/20/2023 0859   GFRNONAA >60 09/15/2024 0607    IMAGING past 24 hours No results found.   Vitals:   09/15/24 1400 09/15/24 1500 09/15/24 1548 09/15/24 1600  BP: 136/77 124/63  138/65  Pulse: 71 67  67  Resp:      Temp:   98.5 F (36.9 C)   TempSrc:   Axillary   SpO2: 99% 97%  98%  Weight:      Height:         PHYSICAL EXAM General:  Alert, well-nourished, well-developed patient in no acute distress Psych:  Mood and affect appropriate for situation CV: Regular rate and rhythm on monitor Respiratory:  Regular, unlabored respirations on room air GI: Abdomen soft and nontender   NEURO:  Drowsy, lethargic, eyes open on voice, orientated to age, place, time and  people. No aphasia, slow talking and hypophonia and mild dysarthria, following all simple commands. Able to name and repeat. Forced R gaze, not cross midline, however visual field full with left upper quadrant simultagnosia, PERRL. L facial droop with difficulty closing L eye. Tongue protrusion to the right. RUE and RLE 5/5, LUE flaccid. LLE 1/5 proximal and 2/5 toe movement. Sensation diminished on the left UE and LE but symmetrical on the left face, R FTN intact, gait not tested.    ASSESSMENT/PLAN  [name] is a 74 y.o. female with history of  generalized anxiety disorder, headaches/migraines, HLD, HTN, prior stroke and TIA's on Plavix  who presents from home via EMS for evaluation of falls with acute onset of left side weakness, left facial and right gaze preference.  NIH on Admission 19  ICH: Large right frontal lobe ICH with IVH, etiology:  unclear  Code Stroke CT head - Large right frontal lobe intracerebral hemorrhage with intraventricular extension and right vertex subarachnoid hemorrhage, resulting in surrounding cerebral edema and 5 mm leftward midline shift CTA head & neck No large vessel occlusion, hemodynamically significant stenosis, or aneurysm in the head or neck. CT Head  Large right frontal parenchymal hematoma is slightly increased in size. Similar surrounding edema and mass effect, including 6 mm leftward midline shift MRI Large intraparenchymal hematoma centered in the right frontal lobe with overlying subarachnoid hemorrhage, unchanged in size compared to the head CT from 09/13/2024. Moderate cytotoxic edema at the posterior aspect of the hemorrhage. This could be a region of ischemia, but it does not necessarily indicate that the ischemia occurred prior to the hemorrhage. Approximately 4 mm of leftward midline shift measured at the level of the foramina of [place]. MRV  No venous sinus thrombus  2/3 CT Head- Since 09/13/2024, overall unchanged large right frontal  intraparenchymal hematoma with associated subarachnoid and intraventricular hemorrhage. Overall similar associated edema and mass effect, with 6 mm of leftward midline shift. Cerebral angiogram with NIR- possible tomorrow  CT repeat in am 2D Echo EF 60-65% LDL 113 HgbA1c 5.7 VTE prophylaxis - SCDs clopidogrel  75 mg daily prior to admission, now on No antithrombotic in the setting of ICH Therapy recommendations:  CIR Disposition: Pending  Cerebral edema CT and MRI reported 5 to 6 mm midline shift Patient neurologically still intact mental status Na 140--137 Hold off 3% at this time On salt tabs NSG Dr. Rosslyn on board, no surgery needed at this time.  May consider surgical intervention if mental status getting worse.  Hx of Stroke/TIA Home meds: Plavix  10/12/2022 reported speech difficulty for several minutes.  MRI of brain on 10/18/2022 revealed small punctate left temporoparietal white matter infarct with underlying moderate chronic small vessel ischemic changes.  EF normal, carotid Doppler negative.  On aspirin  81  Admitted 11/01/2022 MRI of brain showed 2 punctate infarcts in the right medial frontal region, felt to be embolic.  CT head and neck negative.  EF 55 to 60%.  LDL 74, A1c 5.8.  Discharged on DAPT and Crestor  10. April 2024 admitted for transient speech difficulty. MRI of brain on 12/03/2022 was negative for acute infarct. 30 day cardiac event monitor was negative for afib. Declined loop recorder. She was already on ASA and Plavix  so was switched to ASA and Brilinta  for 4 weeks, followed by Plavix  alone.   HTN BP stable, no high BP recorded this admission Cleviprex  if needed BP goal less then 160 Long-term BP goal normotensive  Hyperlipidemia Home meds:  Crestor  10mg  LDL 113, goal < 70 May consider SATURN trial if willing  Dysphagia Patient has post-stroke dysphagia, SLP consulted On dysphagia 2 and honey thick liquid Advance diet as tolerated On IV fluid @ 50cc ->  75cc  Other Stroke Risk Factors Anxiety Decreased urine output, gave IV bolus and continue IV fluid Migraines Follows with Tina Murray neurology No preventative medication, Nurtec as needed   Hospital day # 2   Tina Cummins, MD PhD Stroke Neurology 09/15/2024 4:21 PM  This patient is critically ill due to large ICH, IVH, dysphagia and at significant risk of neurological worsening, death form hematoma expansion, cerebral edema, brain herniation, obstructive hydrocephalus, aspiration pneumonia, seizure. This patient's care requires constant monitoring of vital signs, hemodynamics, respiratory and cardiac monitoring, review of multiple databases, neurological assessment, discussion with family, other specialists and medical decision making of high complexity. I spent 40 minutes of neurocritical care time in the care of this patient. I had long discussion with patient and  husband at bedside, updated pt current condition, treatment plan and potential prognosis, and answered all the questions.  They expressed understanding and appreciation.     To contact Stroke Continuity provider, please refer  to [email or web address]. After hours, contact General Neurology

## 2024-09-16 ENCOUNTER — Inpatient Hospital Stay (HOSPITAL_COMMUNITY)

## 2024-09-16 DIAGNOSIS — I10 Essential (primary) hypertension: Secondary | ICD-10-CM | POA: Diagnosis not present

## 2024-09-16 DIAGNOSIS — I611 Nontraumatic intracerebral hemorrhage in hemisphere, cortical: Secondary | ICD-10-CM | POA: Diagnosis not present

## 2024-09-16 DIAGNOSIS — G936 Cerebral edema: Secondary | ICD-10-CM | POA: Diagnosis not present

## 2024-09-16 DIAGNOSIS — I615 Nontraumatic intracerebral hemorrhage, intraventricular: Secondary | ICD-10-CM | POA: Diagnosis not present

## 2024-09-16 DIAGNOSIS — I69391 Dysphagia following cerebral infarction: Secondary | ICD-10-CM | POA: Diagnosis not present

## 2024-09-16 LAB — SODIUM
Sodium: 138 mmol/L (ref 135–145)
Sodium: 144 mmol/L (ref 135–145)

## 2024-09-16 MED ORDER — VERAPAMIL HCL 2.5 MG/ML IV SOLN
INTRAVENOUS | Status: AC
  Filled 2024-09-16: qty 2

## 2024-09-16 MED ORDER — VERAPAMIL HCL 2.5 MG/ML IV SOLN: INTRA_ARTERIAL | Status: AC | PRN

## 2024-09-16 MED ORDER — IOHEXOL 300 MG/ML  SOLN
50.0000 mL | Freq: Once | INTRAMUSCULAR | Status: AC | PRN
  Administered 2024-09-16: 25 mL via INTRA_ARTERIAL

## 2024-09-16 MED ORDER — HEPARIN SODIUM (PORCINE) 1000 UNIT/ML IJ SOLN
INTRAMUSCULAR | Status: AC | PRN
  Administered 2024-09-16: 3000 [IU] via INTRAVENOUS

## 2024-09-16 MED ORDER — SODIUM CHLORIDE 3 % IV SOLN
INTRAVENOUS | Status: AC
  Filled 2024-09-16 (×5): qty 500

## 2024-09-16 MED ORDER — LIDOCAINE HCL (PF) 1 % IJ SOLN
10.0000 mL | Freq: Once | INTRAMUSCULAR | Status: AC
  Administered 2024-09-16: 10 mL

## 2024-09-16 MED ORDER — HEPARIN SODIUM (PORCINE) 5000 UNIT/ML IJ SOLN
5000.0000 [IU] | Freq: Three times a day (TID) | INTRAMUSCULAR | Status: AC
  Administered 2024-09-16 – 2024-09-17 (×4): 5000 [IU] via SUBCUTANEOUS
  Filled 2024-09-16 (×4): qty 1

## 2024-09-16 MED ORDER — FENTANYL CITRATE (PF) 100 MCG/2ML IJ SOLN
INTRAMUSCULAR | Status: AC
  Filled 2024-09-16: qty 2

## 2024-09-16 MED ORDER — HEPARIN SODIUM (PORCINE) 1000 UNIT/ML IJ SOLN
INTRAMUSCULAR | Status: AC
  Filled 2024-09-16: qty 10

## 2024-09-16 MED ORDER — FENTANYL CITRATE (PF) 100 MCG/2ML IJ SOLN
INTRAMUSCULAR | Status: AC | PRN
  Administered 2024-09-16: 50 ug via INTRAVENOUS

## 2024-09-16 MED ORDER — SODIUM CHLORIDE 23.4 % INJECTION (4 MEQ/ML) FOR IV ADMINISTRATION
120.0000 meq | Freq: Once | INTRAVENOUS | Status: AC
  Administered 2024-09-16: 120 meq via INTRAVENOUS
  Filled 2024-09-16: qty 30

## 2024-09-16 MED ORDER — NITROGLYCERIN 1 MG/10 ML FOR IR/CATH LAB
INTRA_ARTERIAL | Status: AC
  Filled 2024-09-16: qty 10

## 2024-09-16 MED ORDER — LIDOCAINE HCL 1 % IJ SOLN
INTRAMUSCULAR | Status: AC
  Filled 2024-09-16: qty 20

## 2024-09-16 MED ORDER — MIDAZOLAM HCL 2 MG/2ML IJ SOLN
INTRAMUSCULAR | Status: AC
  Filled 2024-09-16: qty 2

## 2024-09-16 NOTE — Progress Notes (Signed)
 Physical Therapy Treatment Patient Details Name: Tina Murray MRN: [id number] DOB: [birth year] Today's Date: 09/16/2024   History of Present Illness 74 yo F adm 09/13/24 with falls and Lt weakness, large intraparenchymal hematoma of Rt frontal lobe. PMHx: DDD, GAD, HLD, HTN, CVA, lumbar radiculopathy, OA, osteoporosis, sciatica, scoliosis    PT Comments  Pt pleasant with maintained left neglect but pt able to rotate head to left with cues in supine and sitting and cross visual midline inconsistently with increased time. Pt continues to state she is moving LUE or LLE when in fact it is the rt side. Pt with impaired ability to follow functional commands with frequent picking up Rt foot off floor when instructed to attempt standing or extending knee rather than flexion. Attempted stedy with +1 assist but unsuccessful and will return to +2 attempts for standing and face to face pivots. Will continue to follow. Patient will benefit from intensive inpatient follow-up therapy, >3 hours/day  BP 150/89 on arrival supine Sitting EOB 137/90, HR 88 End of session 138/73   If plan is discharge home, recommend the following: Two people to help with walking and/or transfers;Two people to help with bathing/dressing/bathroom;Direct supervision/assist for medications management;Assistance with cooking/housework;Assistance with feeding;Assist for transportation;Direct supervision/assist for financial management;Supervision due to cognitive status   Can travel by private vehicle        Equipment Recommendations  Wheelchair (measurements PT);Wheelchair cushion (measurements PT)    Recommendations for Other Services       Precautions / Restrictions Precautions Precautions: Fall;Other (comment) Recall of Precautions/Restrictions: Impaired Precaution/Restrictions Comments: SBP <150, left neglect     Mobility  Bed Mobility Overal bed mobility: Needs Assistance Bed Mobility: Supine to Sit, Sit to  Supine Rolling: Mod assist   Supine to sit: Mod assist Sit to supine: Max assist   General bed mobility comments: pt positioned toward Rt on arrival with physical assist to move and advance left side off of bed and fully elevate trunk. mod assist sitting balance with RUE on lap to prevent pushing. return to bed max assist and total assist to slide to [facility]    Transfers Overall transfer level: Needs assistance                 General transfer comment: attempted use of stedy this date as +2 not available. Pt able to grasp bar with Rt hand and with knee pad blocking LLE not able to push into knee extension however pt pushing posteriorly with RUE and unable with elevated bed or physical assist to rise from surface to stedy with +1 assist. Aborted standing trial and returned to bed for pt and therapist safety    Ambulation/Gait                   Stairs             Wheelchair Mobility     Tilt Bed    Modified Rankin (Stroke Patients Only) Modified Rankin (Stroke Patients Only) Pre-Morbid Rankin Score: No symptoms Modified Rankin: Severe disability     Balance Overall balance assessment: Needs assistance Sitting-balance support: Feet supported, No upper extremity supported Sitting balance-Leahy Scale: Poor Sitting balance - Comments: pushing with R UE, physical assist to break Lt extensor tone and maintain sitting with mod assist                                    Communication Communication Communication:  No apparent difficulties  Cognition Arousal: Alert Behavior During Therapy: Flat affect   PT - Cognitive impairments: Orientation, Problem solving, Safety/Judgement, Sequencing, Attention, Initiation, Awareness   Orientation impairments: Time, Situation                   PT - Cognition Comments: pt with left neglect, decreased awareness of safety and positioning. Pt pushing with RUE and unaware, decreased command following. Will  attend to LUE with increased time and cues Following commands: Impaired Following commands impaired: Follows one step commands with increased time, Follows one step commands inconsistently    Cueing Cueing Techniques: Verbal cues, Gestural cues, Tactile cues, Visual cues  Exercises      General Comments        Pertinent Vitals/Pain Pain Assessment Pain Assessment: Faces Faces Pain Scale: Hurts little more Pain Location: Rt neck Pain Descriptors / Indicators: Aching, Guarding Pain Intervention(s): Limited activity within patient's tolerance, Monitored during session, Repositioned, Ice applied    Home Living                          Prior Function            PT Goals (current goals can now be found in the care plan section) Progress towards PT goals: Progressing toward goals    Frequency    Min 4X/week      PT Plan      Co-evaluation              AM-PAC PT 6 Clicks Mobility   Outcome Measure  Help needed turning from your back to your side while in a flat bed without using bedrails?: A Lot Help needed moving from lying on your back to sitting on the side of a flat bed without using bedrails?: A Lot Help needed moving to and from a bed to a chair (including a wheelchair)?: Total Help needed standing up from a chair using your arms (e.g., wheelchair or bedside chair)?: Total Help needed to walk in hospital room?: Total Help needed climbing 3-5 steps with a railing? : Total 6 Click Score: 8    End of Session   Activity Tolerance: Patient tolerated treatment well Patient left: in bed;with call bell/phone within reach;with bed alarm set Nurse Communication: Mobility status PT Visit Diagnosis: Other abnormalities of gait and mobility (R26.89);Difficulty in walking, not elsewhere classified (R26.2);Other symptoms and signs involving the nervous system (R29.898);Hemiplegia and hemiparesis Hemiplegia - Right/Left: Left Hemiplegia -  dominant/non-dominant: Non-dominant Hemiplegia - caused by: Nontraumatic intracerebral hemorrhage     Time: 0734-0807 PT Time Calculation (min) (ACUTE ONLY): 33 min  Charges:    $Therapeutic Activity: 23-37 mins PT General Charges $$ ACUTE PT VISIT: 1 Visit                     Lenoard SQUIBB, PT Acute Rehabilitation Services Office: [phone]    Lenoard NOVAK Halyn Flaugher 09/16/2024, 10:33 AM

## 2024-09-16 NOTE — Progress Notes (Signed)
 OT Cancellation Note  Patient Details Name: Tina Murray MRN: [id number] DOB: Dec 09, 1950   Cancelled Treatment:    Reason Eval/Treat Not Completed: Patient at procedure or test/ unavailable (Pt in angio per RN, will continue efforts as able.)   Callen Zuba M. [place], OTR/L [facility] Acute Rehabilitation Services [phone] Secure Chat Preferred  Kandon Hosking 09/16/2024, 9:57 AM

## 2024-09-16 NOTE — Progress Notes (Signed)
 STROKE TEAM PROGRESS NOTE    SIGNIFICANT HOSPITAL EVENTS 2/2- admitted to 4NICU with ICH  INTERIM HISTORY/SUBJECTIVE Husband at the bedside. Pt lying in bed, lethargic but neuro stable, unchanged, still fully orientated with left hemiplegia and following commands. However, CT repeat showed worsening cerebral edema and MLS, will start 3% saline. Dr. Lester had angiogram today, no AVM or aneurysm seen.   OBJECTIVE  CBC    Component Value Date/Time   WBC 15.5 (H) 09/15/2024 0607   RBC 4.26 09/15/2024 0607   HGB 13.0 09/15/2024 0607   HGB 13.9 10/20/2023 0859   HCT 38.5 09/15/2024 0607   HCT 42.5 10/20/2023 0859   PLT 314 09/15/2024 0607   PLT 288 10/20/2023 0859   MCV 90.4 09/15/2024 0607   MCV 93 10/20/2023 0859   MCH 30.5 09/15/2024 0607   MCHC 33.8 09/15/2024 0607   RDW 13.8 09/15/2024 0607   RDW 13.2 10/20/2023 0859   LYMPHSABS 0.8 09/13/2024 0901   MONOABS 0.3 09/13/2024 0901   EOSABS 0.0 09/13/2024 0901   BASOSABS 0.0 09/13/2024 0901    BMET    Component Value Date/Time   NA 144 09/16/2024 1835   NA 142 10/20/2023 0859   K 3.6 09/15/2024 0607   CL 105 09/15/2024 0607   CO2 20 (L) 09/15/2024 0607   GLUCOSE 108 (H) 09/15/2024 0607   BUN 13 09/15/2024 0607   BUN 17 10/20/2023 0859   CREATININE 0.57 09/15/2024 0607   CREATININE 0.90 08/21/2022 1148   CALCIUM  8.3 (L) 09/15/2024 0607   EGFR 57 (L) 10/20/2023 0859   GFRNONAA >60 09/15/2024 0607    IMAGING past 24 hours CT HEAD WO CONTRAST ( ) Result Date: 09/16/2024 EXAM: CT HEAD WITHOUT CONTRAST 09/16/2024 04:11:37 AM TECHNIQUE: CT of the head was performed without the administration of intravenous contrast. Automated exposure control, iterative reconstruction, and/or weight based adjustment of the mA/kV was utilized to reduce the radiation dose to as low as reasonably achievable. COMPARISON: Brain MRI 09/13/2024, CT head 09/15/2024. CLINICAL HISTORY: 74 year old female. Stroke presentation on 09/13/2024 with frontal  lobe intraaxial hemorrhage. FINDINGS: BRAIN AND VENTRICLES: Large oval and flame shaped hyperdense intraaxial hemorrhage in the mid portion of the right frontal lobe (sagittal image 18) measuring 50 x 49 x 61 mm (AP x transverse x CC). Estimated volume 77 mL. This hemorrhage has not significantly changed in size or configuration since yesterday. Comparatively small volume of overlying subarachnoid hemorrhage is reidentified. A small volume of intraventricular extension of blood also appears stable including in the left occipital horn (series 2 image 15). Ongoing significant regional and intracranial mass effect including leftward shift of the septum pellucidum by 9 mm that appears increased (coronal image 41). Midline shift yesterday was 5 to 6 mm. Associated increased mass effect on the lateral ventricles, and slightly larger left lateral ventricle (series 2 image 16) although the left temporal horn has not significantly dilated. No new intracranial blood products. Confluent hypodense edema in the right frontal lobe is stable. There is slightly more effacement of the suprasellar cistern now (sagittal image 28) whereas other basilar cisterns remain patent. Stable gray white differentiation. No suspicious intracranial vascular hyperdensity. ORBITS: Rightward gaze deviation. SINUSES: Visible paranasal sinuses, tympanic cavities, and mastoids are well aerated. SOFT TISSUES AND SKULL: Calvarium intact. No acute soft tissue abnormality. No skull fracture. IMPRESSION: 1. Right frontal lobe parenchymal hemorrhage (estimated 78 mL) is stable along with with small volume SAH and IVH extension. 2. But Increased leftward midline shift now 9 mm,  and possible early trapping of the left lateral ventricle. 3. The above discussed by telephone with Dr. JONELLE. Smigrodzki at [phone] hours today. Electronically signed by: Helayne Hurst MD 09/16/2024 04:29 AM EST RP Workstation: [id number]     Vitals:   09/16/24 1700 09/16/24 1800 09/16/24  1900 09/16/24 2000  BP: 124/75 134/70 128/68 (!) 156/97  Pulse: 82 75 76 (!) 108  Resp:    18  Temp:    (!) 100.4 F (38 C)  TempSrc:    Axillary  SpO2: 97% 95% 97% 96%  Weight:      Height:         PHYSICAL EXAM General:  Alert, well-nourished, well-developed patient in no acute distress Psych:  Mood and affect appropriate for situation CV: Regular rate and rhythm on monitor Respiratory:  Regular, unlabored respirations on room air GI: Abdomen soft and nontender   NEURO:  Drowsy, lethargic, eyes open on voice, orientated to age, place, time and people. No aphasia, slow talking and hypophonia and mild dysarthria, following all simple commands. Able to name and repeat. Forced R gaze, not cross midline, however visual field full with left upper quadrant simultagnosia, PERRL. L facial droop with difficulty closing L eye. Tongue protrusion to the right. RUE and RLE 5/5, LUE flaccid. LLE 1/5 proximal and 2/5 toe movement. Sensation diminished on the left UE and LE but symmetrical on the left face, R FTN intact, gait not tested.    ASSESSMENT/PLAN  [name] is a 74 y.o. female with history of  generalized anxiety disorder, headaches/migraines, HLD, HTN, prior stroke and TIA's on Plavix  who presents from home via EMS for evaluation of falls with acute onset of left side weakness, left facial and right gaze preference.  NIH on Admission 19  ICH: Large right frontal lobe ICH with IVH, etiology:  unclear  Code Stroke CT head - Large right frontal lobe intracerebral hemorrhage with intraventricular extension and right vertex subarachnoid hemorrhage, resulting in surrounding cerebral edema and 5 mm leftward midline shift CTA head & neck No large vessel occlusion, hemodynamically significant stenosis, or aneurysm in the head or neck. CT Head Large right frontal parenchymal hematoma is slightly increased in size. Similar surrounding edema and mass effect, including 6 mm leftward midline  shift MRI Large intraparenchymal hematoma centered in the right frontal lobe with overlying subarachnoid hemorrhage, unchanged in size compared to the head CT from 09/13/2024. Moderate cytotoxic edema at the posterior aspect of the hemorrhage. This could be a region of ischemia, but it does not necessarily indicate that the ischemia occurred prior to the hemorrhage. Approximately 4 mm of leftward midline shift measured at the level of the foramina of [place]. MRV  No venous sinus thrombus  2/3 CT Head- Since 09/13/2024, overall unchanged large right frontal intraparenchymal hematoma with associated subarachnoid and intraventricular hemorrhage. Overall similar associated edema and mass effect, with 6 mm of leftward midline shift. Cerebral angiogram no AVM or aneurysm CT repeat 09/17/23 stable hematoma but increased leftward midline shift now 9 mm, and possible early trapping of the left lateral ventricle. 2D Echo EF 60-65% LDL 113 HgbA1c 5.7 VTE prophylaxis - heparin  subq clopidogrel  75 mg daily prior to admission, now on No antithrombotic in the setting of ICH Therapy recommendations:  CIR Disposition: Pending  Cerebral edema CT and MRI reported 5 to 6 mm midline shift CT repeat 2/5 with MLS 9mm and left lateral ventricle trapping Patient neurologically still intact mental status Na 140--137--144 Na Q6h Start 3% saline  today @75  23.4% bolus x 1 On salt tabs NSG Dr. Janjua on board, no surgery needed at this time.  May consider surgical intervention if mental status getting worse.  Hx of Stroke/TIA Home meds: Plavix  10/12/2022 reported speech difficulty for several minutes.  MRI of brain on 10/18/2022 revealed small punctate left temporoparietal white matter infarct with underlying moderate chronic small vessel ischemic changes.  EF normal, carotid Doppler negative.  On aspirin  81  Admitted 11/01/2022 MRI of brain showed 2 punctate infarcts in the right medial frontal region, felt to be embolic.  CT  head and neck negative.  EF 55 to 60%.  LDL 74, A1c 5.8.  Discharged on DAPT and Crestor  10. April 2024 admitted for transient speech difficulty. MRI of brain on 12/03/2022 was negative for acute infarct. 30 day cardiac event monitor was negative for afib. Declined loop recorder. She was already on ASA and Plavix  so was switched to ASA and Brilinta  for 4 weeks, followed by Plavix  alone.   HTN BP stable, no high BP recorded this admission Cleviprex  if needed BP goal less then 160 Long-term BP goal normotensive  Hyperlipidemia Home meds:  Crestor  10mg  LDL 113, goal < 70 May consider SATURN trial if willing  Dysphagia Patient has post-stroke dysphagia, SLP consulted On dysphagia 2 and honey thick liquid Advance diet as tolerated On IVF  Other Stroke Risk Factors Anxiety Decreased urine output, gave IV bolus and continue IV fluid Migraines Follows with [facility] No preventative medication, Nurtec as needed   Hospital day # 3   Ary Cummins, MD PhD Stroke Neurology 09/16/2024 9:42 PM  This patient is critically ill due to large ICH, IVH, dysphagia and at significant risk of neurological worsening, death form hematoma expansion, cerebral edema, brain herniation, obstructive hydrocephalus, aspiration pneumonia, seizure. This patient's care requires constant monitoring of vital signs, hemodynamics, respiratory and cardiac monitoring, review of multiple databases, neurological assessment, discussion with family, other specialists and medical decision making of high complexity. I spent 40 minutes of neurocritical care time in the care of this patient. I had long discussion with patient husband at bedside, updated pt current condition, treatment plan and potential prognosis, and answered all the questions.  He expressed understanding and appreciation. I also discussed with Dr. Lester SORENSON.     To contact Stroke Continuity provider, please refer to [email or web address]. After hours, contact General  Neurology

## 2024-09-16 NOTE — Progress Notes (Signed)
 Neuro exam stable.  More calms this morning.Will proceed with arteriogram.  I discussed the reason for the procedure and the risks with her husband and explained the arteriogram is necessary to exclude and AVM.

## 2024-09-16 NOTE — Procedures (Signed)
" °  NEUROSURGERY BRIEF OP NOTE   PREOP DX: Right frontal intraparenchymal hemorrhage  POSTOP DX: Same  PROCEDURE: Cerebral arteriogram  SURGEON: Nancyann LULLA Burns   ANESTHESIA: IV Sedation with Local  EBL: 10  COMPLICATIONS: No immediate  CONDITION: Stable  FINDINGS:  No AVM or other vascular lesion identified  Nancyann LULLA Burns  @today @ 9:44 AM  "

## 2024-09-16 NOTE — Plan of Care (Signed)
" °  Problem: Intracerebral Hemorrhage Tissue Perfusion: Goal: Complications of Intracerebral Hemorrhage will be minimized Outcome: Progressing   Problem: Health Behavior/Discharge Planning: Goal: Goals will be collaboratively established with patient/family Outcome: Progressing   Problem: Self-Care: Goal: Ability to communicate needs accurately will improve Outcome: Progressing   Problem: Coping: Goal: Level of anxiety will decrease Outcome: Progressing   Problem: Pain Managment: Goal: General experience of comfort will improve and/or be controlled Outcome: Progressing   "

## 2024-09-16 NOTE — Progress Notes (Signed)
 Speech Language Pathology Treatment: Dysphagia;Cognitive-Linguistic  Patient Details Name: Tina Murray MRN: [id number] DOB: 1951-08-11 Today's Date: 09/16/2024 Time: 8572-8555 SLP Time Calculation (min) (ACUTE ONLY): 17 min  Assessment / Plan / Recommendation Clinical Impression  PLAN: continue dysphagia 2, honey-thick liquids with full supervision/assistance.  Pt interactive; husband at the bedside.  Worked on holding head in neutral position while self-feeding honey-thick liquids from a cup. Pt able to hold cup with right hand with hand-over-hand assist to manage bolus size. There was occasional oral holding in left cheek; no s/s of aspiration. With husband at foot of bed, she was eventually able to bring her gaze to the center very briefly.  We discussed impact of stroke on her body to begin to work on awareness; she needed assist to find her left hand and identify the fingers.  Discussed temporary nature of honey-thick liquids while her swallowing begins to improve. She verbalized understanding but is eager to drink regular liquids.  SLP will continue to follow for cognition and swallowing.   HPI HPI: Tina Murray is a 74 yo female admitted on 09/13/24 with acute L sided weakness, L facial droop, and R gaze preference. Head CT revealed a Large (57 cc) right frontal lobe intracerebral hemorrhage with intraventricular  extension and right vertex subarachnoid hemorrhage, resulting in surrounding  cerebral edema and 5 mm leftward midline shift.  hx of generalized anxiety disorder, headaches/migraines, HLD, HTN, prior stroke. SLP consulted for cognitive-linguistic evalaution and bedside swallow evaluation.      SLP Plan  Continue with current plan of care        Swallow Evaluation Recommendations   Recommendations: PO diet PO Diet Recommendation: Dysphagia 2 (Finely chopped);Moderately thick liquids (Level 3, honey thick) Liquid Administration via: Cup;No straw Medication Administration:  Crushed with puree Supervision: Staff to assist with self-feeding Postural changes: Position pt fully upright for meals Oral care recommendations: Oral care BID (2x/day)     Recommendations                     Oral care QID     Dysphagia, oropharyngeal phase (R13.12)     Continue with current plan of care    Tina Kader L. [place], MA CCC/SLP Clinical Specialist - Acute Care SLP Acute Rehabilitation Services Office number [phone]  Tina Murray  09/16/2024, 2:51 PM

## 2024-09-16 NOTE — Progress Notes (Signed)
 Her neurological exam is actually better today.  She is more alert and less agitated.  Her language function is normal.  She is following commands and her expression is normal.  Her gaze deviation is also improved.  She has a partial gaze preference but is able to overcome it and look to the left side.  She counts fingers in both visual fields and is no longer neglecting the left visual field.  She does not have movement in the left arm or leg.  She does have sensation in the left leg.  She is also no longer neglecting the left side to simultaneous stimulation.  I reviewed her CT which shows slight increase in midline shift but no new bleeding.  Her arteriogram today showed no AVM or other vascular lesion.  Assessment:  Spontaneous right frontal hemorrhage, uncertain etiology but no underlying vascular lesion identified.  Neurological exam improving.  Recommendation:  Continue medical management for cerebral swelling.

## 2024-09-17 LAB — CBC
HCT: 36.4 % (ref 36.0–46.0)
Hemoglobin: 12.4 g/dL (ref 12.0–15.0)
MCH: 31.3 pg (ref 26.0–34.0)
MCHC: 34.1 g/dL (ref 30.0–36.0)
MCV: 91.9 fL (ref 80.0–100.0)
Platelets: 263 10*3/uL (ref 150–400)
RBC: 3.96 MIL/uL (ref 3.87–5.11)
RDW: 13.6 % (ref 11.5–15.5)
WBC: 13.7 10*3/uL — ABNORMAL HIGH (ref 4.0–10.5)
nRBC: 0 % (ref 0.0–0.2)

## 2024-09-17 LAB — BASIC METABOLIC PANEL WITH GFR
Anion gap: 14 (ref 5–15)
BUN: 9 mg/dL (ref 8–23)
CO2: 21 mmol/L — ABNORMAL LOW (ref 22–32)
Calcium: 8.4 mg/dL — ABNORMAL LOW (ref 8.9–10.3)
Chloride: 112 mmol/L — ABNORMAL HIGH (ref 98–111)
Creatinine, Ser: 0.58 mg/dL (ref 0.44–1.00)
GFR, Estimated: >60 mL/min
Glucose, Bld: 116 mg/dL — ABNORMAL HIGH (ref 70–99)
Potassium: 2.9 mmol/L — ABNORMAL LOW (ref 3.5–5.1)
Sodium: 146 mmol/L — ABNORMAL HIGH (ref 135–145)

## 2024-09-17 LAB — SODIUM
Sodium: 146 mmol/L — ABNORMAL HIGH (ref 135–145)
Sodium: 148 mmol/L — ABNORMAL HIGH (ref 135–145)
Sodium: 146 mmol/L — ABNORMAL HIGH (ref 135–145)

## 2024-09-17 MED ORDER — DICLOFENAC SODIUM 1 % EX GEL
4.0000 g | Freq: Four times a day (QID) | CUTANEOUS | Status: AC
  Administered 2024-09-17 (×2): 4 g via TOPICAL
  Filled 2024-09-17: qty 100

## 2024-09-17 MED ORDER — SODIUM CHLORIDE 23.4 % INJECTION (4 MEQ/ML) FOR IV ADMINISTRATION
120.0000 meq | Freq: Once | INTRAVENOUS | Status: DC
  Filled 2024-09-17: qty 30

## 2024-09-17 MED ORDER — MELATONIN 5 MG PO TABS
5.0000 mg | ORAL_TABLET | Freq: Every day | ORAL | Status: AC
  Administered 2024-09-17: 5 mg via ORAL
  Filled 2024-09-17: qty 1

## 2024-09-17 MED ORDER — BISACODYL 5 MG PO TBEC
10.0000 mg | DELAYED_RELEASE_TABLET | Freq: Once | ORAL | Status: AC
  Administered 2024-09-17: 10 mg via ORAL
  Filled 2024-09-17: qty 2

## 2024-09-17 MED ORDER — POTASSIUM CHLORIDE 10 MEQ/100ML IV SOLN
10.0000 meq | INTRAVENOUS | Status: AC
  Administered 2024-09-17 (×4): 10 meq via INTRAVENOUS
  Filled 2024-09-17 (×4): qty 100

## 2024-09-17 MED ORDER — SODIUM CHLORIDE 23.4 % INJECTION (4 MEQ/ML) FOR IV ADMINISTRATION: 120.0000 meq | Freq: Once | INTRAVENOUS | Status: DC

## 2024-09-17 MED ORDER — BISACODYL 10 MG RE SUPP: 10.0000 mg | Freq: Once | RECTAL | Status: DC

## 2024-09-17 MED ORDER — POTASSIUM CHLORIDE 20 MEQ PO PACK
60.0000 meq | PACK | Freq: Once | ORAL | Status: AC
  Administered 2024-09-17: 60 meq via ORAL
  Filled 2024-09-17: qty 3

## 2024-09-17 NOTE — Plan of Care (Signed)
" °  Problem: Education: Goal: Knowledge of disease or condition will improve Outcome: Not Progressing   Problem: Intracerebral Hemorrhage Tissue Perfusion: Goal: Complications of Intracerebral Hemorrhage will be minimized Outcome: Progressing   Problem: Self-Care: Goal: Ability to communicate needs accurately will improve Outcome: Progressing   "

## 2024-09-17 NOTE — Progress Notes (Signed)
 STROKE TEAM PROGRESS NOTE   INTERIM HISTORY/SUBJECTIVE Husband at the bedside. Pt lying in bed, more awake alert, were conversive, slight dysarthria but denies headache.  On 3% saline.  Sodium 148.  OBJECTIVE  CBC    Component Value Date/Time   WBC 13.7 (H) 09/17/2024 0605   RBC 3.96 09/17/2024 0605   HGB 12.4 09/17/2024 0605   HGB 13.9 10/20/2023 0859   HCT 36.4 09/17/2024 0605   HCT 42.5 10/20/2023 0859   PLT 263 09/17/2024 0605   PLT 288 10/20/2023 0859   MCV 91.9 09/17/2024 0605   MCV 93 10/20/2023 0859   MCH 31.3 09/17/2024 0605   MCHC 34.1 09/17/2024 0605   RDW 13.6 09/17/2024 0605   RDW 13.2 10/20/2023 0859   LYMPHSABS 0.8 09/13/2024 0901   MONOABS 0.3 09/13/2024 0901   EOSABS 0.0 09/13/2024 0901   BASOSABS 0.0 09/13/2024 0901    BMET    Component Value Date/Time   NA 148 (H) 09/17/2024 1351   NA 142 10/20/2023 0859   K 2.9 (L) 09/17/2024 0605   CL 112 (H) 09/17/2024 0605   CO2 21 (L) 09/17/2024 0605   GLUCOSE 116 (H) 09/17/2024 0605   BUN 9 09/17/2024 0605   BUN 17 10/20/2023 0859   CREATININE 0.58 09/17/2024 0605   CREATININE 0.90 08/21/2022 1148   CALCIUM  8.4 (L) 09/17/2024 0605   EGFR 57 (L) 10/20/2023 0859   GFRNONAA >60 09/17/2024 9394    IMAGING past 24 hours No results found.    Vitals:   09/17/24 1556 09/17/24 1600 09/17/24 1700 09/17/24 1800  BP:  133/82 (!) 145/93 (!) 149/108  Pulse:  (!) 104 (!) 102 (!) 104  Resp:  (!) 22 17 (!) 26  Temp: 99 F (37.2 C)     TempSrc: Axillary     SpO2:  100% 100% 100%  Weight:      Height:         PHYSICAL EXAM General:  Alert, well-nourished, well-developed patient in no acute distress Psych:  Mood and affect appropriate for situation CV: Regular rate and rhythm on monitor Respiratory:  Regular, unlabored respirations on room air GI: Abdomen soft and nontender   NEURO:  Awake alert, eyes open, orientated to age, place, time and people. No aphasia, mild dysarthria, following all simple  commands. Able to name and repeat. Forced R gaze, not cross midline, however visual field full with left upper quadrant simultagnosia, PERRL. L facial droop with difficulty closing L eye. Tongue protrusion to the right. RUE and RLE 5/5, LUE flaccid. LLE 1/5 proximal and 2/5 toe movement. Sensation diminished on the left UE and LE but symmetrical on the left face, R FTN intact, gait not tested.    ASSESSMENT/PLAN  [name] is a 74 y.o. female with history of  generalized anxiety disorder, headaches/migraines, HLD, HTN, prior stroke and TIA's on Plavix  who presents from home via EMS for evaluation of falls with acute onset of left side weakness, left facial and right gaze preference.  NIH on Admission 19  ICH: Large right frontal lobe ICH with IVH, etiology:  unclear  Code Stroke CT head - Large right frontal lobe intracerebral hemorrhage with intraventricular extension and right vertex subarachnoid hemorrhage, resulting in surrounding cerebral edema and 5 mm leftward midline shift CTA head & neck No large vessel occlusion, hemodynamically significant stenosis, or aneurysm in the head or neck. CT Head Large right frontal parenchymal hematoma is slightly increased in size. Similar surrounding edema and mass effect,  including 6 mm leftward midline shift MRI Large intraparenchymal hematoma centered in the right frontal lobe with overlying subarachnoid hemorrhage, unchanged in size compared to the head CT from 09/13/2024. Moderate cytotoxic edema at the posterior aspect of the hemorrhage. This could be a region of ischemia, but it does not necessarily indicate that the ischemia occurred prior to the hemorrhage. Approximately 4 mm of leftward midline shift measured at the level of the foramina of [place]. MRV  No venous sinus thrombus  2/3 CT Head- Since 09/13/2024, overall unchanged large right frontal intraparenchymal hematoma with associated subarachnoid and intraventricular hemorrhage. Overall similar  associated edema and mass effect, with 6 mm of leftward midline shift. Cerebral angiogram no AVM or aneurysm CT repeat 09/17/23 stable hematoma but increased leftward midline shift now 9 mm, and possible early trapping of the left lateral ventricle. 2D Echo EF 60-65% LDL 113 HgbA1c 5.7 VTE prophylaxis - heparin  subq clopidogrel  75 mg daily prior to admission, now on No antithrombotic in the setting of ICH Therapy recommendations:  CIR Disposition: Pending  Cerebral edema CT and MRI reported 5 to 6 mm midline shift CT repeat 2/5 with MLS 9mm and left lateral ventricle trapping Patient neurologically still intact mental status Na 140--137--144--148 Na Q6h 3% saline @ 75 23.4% bolus x 1 On salt tabs NSG Dr. Rosslyn on board, no surgery needed at this time.  May consider surgical intervention if mental status getting worse.  Hx of Stroke/TIA Home meds: Plavix  10/12/2022 reported speech difficulty for several minutes.  MRI of brain on 10/18/2022 revealed small punctate left temporoparietal white matter infarct with underlying moderate chronic small vessel ischemic changes.  EF normal, carotid Doppler negative.  On aspirin  81  Admitted 11/01/2022 MRI of brain showed 2 punctate infarcts in the right medial frontal region, felt to be embolic.  CT head and neck negative.  EF 55 to 60%.  LDL 74, A1c 5.8.  Discharged on DAPT and Crestor  10. April 2024 admitted for transient speech difficulty. MRI of brain on 12/03/2022 was negative for acute infarct. 30 day cardiac event monitor was negative for afib. Declined loop recorder. She was already on ASA and Plavix  so was switched to ASA and Brilinta  for 4 weeks, followed by Plavix  alone.   HTN BP stable, no high BP recorded this admission Cleviprex  if needed BP goal less then 160 Long-term BP goal normotensive  Hyperlipidemia Home meds:  Crestor  10mg  LDL 113, goal < 70 May consider SATURN trial if willing  Dysphagia Patient has post-stroke dysphagia,  SLP consulted On dysphagia 2 and honey thick liquid Advance diet as tolerated On IVF  Other Stroke Risk Factors Anxiety Decreased urine output, gave IV bolus and continue IV fluid Migraines Follows with [facility] No preventative medication, Nurtec as needed   Hospital day # 4   Ary Cummins, MD PhD Stroke Neurology 09/17/2024 7:04 PM  This patient is critically ill due to large ICH, IVH, dysphagia and at significant risk of neurological worsening, death form hematoma expansion, cerebral edema, brain herniation, obstructive hydrocephalus, aspiration pneumonia, seizure. This patient's care requires constant monitoring of vital signs, hemodynamics, respiratory and cardiac monitoring, review of multiple databases, neurological assessment, discussion with family, other specialists and medical decision making of high complexity. I spent 35 minutes of neurocritical care time in the care of this patient. I had long discussion with patient husband at bedside, updated pt current condition, treatment plan and potential prognosis, and answered all the questions.  He expressed understanding and appreciation.  To contact Stroke Continuity provider, please refer to [email or web address]. After hours, contact General Neurology

## 2024-09-17 NOTE — TOC Progression Note (Signed)
 Transition of Care [facility]) - Progression Note    Patient Details  Name: Tina Murray MRN: [id number] Date of Birth: 1951-04-01  [facility] of [facility] CM/SW Contact  Inocente GORMAN Kindle, LCSW Phone Number: 09/17/2024, 9:32 AM  Clinical Narrative:    CSW continuing to follow for medical stability.    Expected Discharge Plan: Skilled Nursing Facility Barriers to Discharge: Continued Medical Work up               Expected Discharge Plan and Services       Living arrangements for the past 2 months: Independent Living Facility                                       Social Drivers of Health (SDOH) Interventions SDOH Screenings   Food Insecurity: No Food Insecurity (09/13/2024)  Housing: Low Risk (09/13/2024)  Transportation Needs: No Transportation Needs (09/13/2024)  Utilities: Not At Risk (09/13/2024)  Social Connections: Socially Integrated (09/13/2024)  Tobacco Use: Medium Risk (09/02/2024)    Readmission Risk Interventions     No data to display

## 2024-09-17 NOTE — Progress Notes (Signed)
 Physical Therapy Treatment Patient Details Name: Tina Murray MRN: [id number] DOB: [birth year] Today's Date: 09/17/2024   History of Present Illness 74 yo F adm 09/13/24 with falls and Lt weakness, large intraparenchymal hematoma of Rt frontal lobe. PMHx: DDD, GAD, HLD, HTN, CVA, lumbar radiculopathy, OA, osteoporosis, sciatica, scoliosis    PT Comments  Pt tolerates treatment well, sitting upright for ~15 minutes while working on core activation and posture related to midline. Pt continues to demonstrate a tendency for L lateral lean in sitting. Today the pt is better able to utilize RUE to correct this lean instead of pushing further to L side. Pt stands x2 attempts and also shows some ability to extend hips/trunk and initiate some lateral weight shift with PT assistance. Pt may benefit from utilization of a mirror in future sessions to allow for visual cueing of midline. PT continues to recommend high intensity inpatient PT services, this patient appears to be a great candidate.    If plan is discharge home, recommend the following: Two people to help with walking and/or transfers;Two people to help with bathing/dressing/bathroom;Direct supervision/assist for medications management;Assistance with cooking/housework;Assistance with feeding;Assist for transportation;Direct supervision/assist for financial management;Supervision due to cognitive status   Can travel by private vehicle        Equipment Recommendations  Wheelchair (measurements PT);Wheelchair cushion (measurements PT)    Recommendations for Other Services       Precautions / Restrictions Precautions Precautions: Fall;Other (comment) Recall of Precautions/Restrictions: Impaired Precaution/Restrictions Comments: SBP <160, left neglect Restrictions Weight Bearing Restrictions Per Provider Order: No     Mobility  Bed Mobility Overal bed mobility: Needs Assistance Bed Mobility: Rolling, Sidelying to Sit, Sit to  Sidelying Rolling: Max assist Sidelying to sit: Max assist, HOB elevated     Sit to sidelying: Max assist      Transfers Overall transfer level: Needs assistance Equipment used: 2 person hand held assist Transfers: Sit to/from Stand Sit to Stand: Max assist, +2 physical assistance           General transfer comment: pt stands twice with bilateral knee block, significant L knee buckling with attempts at stepping with R foot    Ambulation/Gait                   Stairs             Wheelchair Mobility     Tilt Bed    Modified Rankin (Stroke Patients Only) Modified Rankin (Stroke Patients Only) Pre-Morbid Rankin Score: No symptoms Modified Rankin: Severe disability     Balance Overall balance assessment: Needs assistance Sitting-balance support: Single extremity supported, Feet supported Sitting balance-Leahy Scale: Poor Sitting balance - Comments: mod-maxA, improved initiation to utilize RUE to pull to R to fix leftward lean, requires max cues Postural control: Left lateral lean Standing balance support: During functional activity, Bilateral upper extremity supported Standing balance-Leahy Scale: Zero Standing balance comment: maxA x 2                            Communication Communication Communication: Impaired Factors Affecting Communication: Reduced clarity of speech  Cognition Arousal: Alert Behavior During Therapy: WFL for tasks assessed/performed   PT - Cognitive impairments: Problem solving, Safety/Judgement, Sequencing, Attention, Initiation, Awareness                       PT - Cognition Comments: pt with left neglect, decreased awareness of safety and positioning. Will  attend to LUE with increased time and cues. Following commands: Impaired Following commands impaired: Follows one step commands with increased time, Follows multi-step commands inconsistently    Cueing Cueing Techniques: Verbal cues, Visual cues,  Tactile cues, Gestural cues  Exercises Other Exercises Other Exercises: pt performed 10 repetitions of leftward cervical rotation while attempting to track therapist or spouse into left visual field/environment    General Comments General comments (skin integrity, edema, etc.): VSS on RA      Pertinent Vitals/Pain Pain Assessment Pain Assessment: Faces Faces Pain Scale: Hurts little more Pain Location: neck Pain Descriptors / Indicators: Aching Pain Intervention(s): Monitored during session    Home Living                          Prior Function            PT Goals (current goals can now be found in the care plan section) Acute Rehab PT Goals Patient Stated Goal: be able to return home Progress towards PT goals: Progressing toward goals    Frequency    Min 4X/week      PT Plan      Co-evaluation PT/OT/SLP Co-Evaluation/Treatment: Yes Reason for Co-Treatment: Complexity of the patient's impairments (multi-system involvement);Necessary to address cognition/behavior during functional activity;For patient/therapist safety;To address functional/ADL transfers PT goals addressed during session: Mobility/safety with mobility;Balance;Strengthening/ROM        AM-PAC PT 6 Clicks Mobility   Outcome Measure  Help needed turning from your back to your side while in a flat bed without using bedrails?: A Lot Help needed moving from lying on your back to sitting on the side of a flat bed without using bedrails?: A Lot Help needed moving to and from a bed to a chair (including a wheelchair)?: Total Help needed standing up from a chair using your arms (e.g., wheelchair or bedside chair)?: Total Help needed to walk in hospital room?: Total Help needed climbing 3-5 steps with a railing? : Total 6 Click Score: 8    End of Session Equipment Utilized During Treatment: Gait belt Activity Tolerance: Patient tolerated treatment well Patient left: in bed;with call  bell/phone within reach;with bed alarm set Nurse Communication: Mobility status;Need for lift equipment PT Visit Diagnosis: Other abnormalities of gait and mobility (R26.89);Difficulty in walking, not elsewhere classified (R26.2);Other symptoms and signs involving the nervous system (R29.898);Hemiplegia and hemiparesis Hemiplegia - Right/Left: Left Hemiplegia - dominant/non-dominant: Non-dominant Hemiplegia - caused by: Nontraumatic intracerebral hemorrhage     Time: 1431-1501 PT Time Calculation (min) (ACUTE ONLY): 30 min  Charges:    $Therapeutic Activity: 8-22 mins PT General Charges $$ ACUTE PT VISIT: 1 Visit                     Bernardino JINNY Ruth, PT, DPT Acute Rehabilitation Office [phone]    Bernardino JINNY Ruth 09/17/2024, 4:45 PM

## 2024-10-19 ENCOUNTER — Encounter: Admitting: Podiatry

## 2024-10-28 ENCOUNTER — Encounter: Admitting: Podiatry

## 2024-12-29 ENCOUNTER — Ambulatory Visit: Admitting: Neurology

## 2025-03-04 ENCOUNTER — Ambulatory Visit: Admitting: Internal Medicine

## 2025-03-14 ENCOUNTER — Ambulatory Visit: Admitting: Neurology
